# Patient Record
Sex: Female | Born: 1935 | Race: White | Hispanic: No | Marital: Married | Smoking: Never smoker
Health system: Southern US, Community
[De-identification: ages and names within clinical notes are randomized; demographics above are authoritative.]

## PROBLEM LIST (undated history)

## (undated) DIAGNOSIS — I10 Essential (primary) hypertension: Secondary | ICD-10-CM

## (undated) DIAGNOSIS — I4821 Permanent atrial fibrillation: Secondary | ICD-10-CM

## (undated) DIAGNOSIS — E876 Hypokalemia: Secondary | ICD-10-CM

## (undated) DIAGNOSIS — I5030 Unspecified diastolic (congestive) heart failure: Secondary | ICD-10-CM

## (undated) DIAGNOSIS — C50919 Malignant neoplasm of unspecified site of unspecified female breast: Secondary | ICD-10-CM

## (undated) DIAGNOSIS — B029 Zoster without complications: Secondary | ICD-10-CM

## (undated) DIAGNOSIS — R9430 Abnormal result of cardiovascular function study, unspecified: Secondary | ICD-10-CM

## (undated) DIAGNOSIS — R079 Chest pain, unspecified: Secondary | ICD-10-CM

## (undated) DIAGNOSIS — I340 Nonrheumatic mitral (valve) insufficiency: Secondary | ICD-10-CM

## (undated) DIAGNOSIS — E785 Hyperlipidemia, unspecified: Secondary | ICD-10-CM

## (undated) DIAGNOSIS — I4891 Unspecified atrial fibrillation: Secondary | ICD-10-CM

## (undated) DIAGNOSIS — R0602 Shortness of breath: Secondary | ICD-10-CM

## (undated) HISTORY — DX: Essential (primary) hypertension: I10

## (undated) HISTORY — DX: Hyperlipidemia, unspecified: E78.5

## (undated) HISTORY — DX: Chest pain, unspecified: R07.9

## (undated) HISTORY — DX: Malignant neoplasm of unspecified site of unspecified female breast: C50.919

## (undated) HISTORY — DX: Unspecified atrial fibrillation: I48.91

## (undated) HISTORY — DX: Permanent atrial fibrillation: I48.21

## (undated) HISTORY — PX: EYE SURGERY: SHX253

## (undated) HISTORY — PX: ABDOMINAL HYSTERECTOMY: SHX81

## (undated) HISTORY — DX: Zoster without complications: B02.9

## (undated) HISTORY — DX: Unspecified diastolic (congestive) heart failure: I50.30

## (undated) HISTORY — DX: Shortness of breath: R06.02

## (undated) HISTORY — DX: Abnormal result of cardiovascular function study, unspecified: R94.30

## (undated) HISTORY — DX: Hypokalemia: E87.6

## (undated) HISTORY — DX: Nonrheumatic mitral (valve) insufficiency: I34.0

---

## 1983-07-05 DIAGNOSIS — C50919 Malignant neoplasm of unspecified site of unspecified female breast: Secondary | ICD-10-CM

## 1983-07-05 HISTORY — DX: Malignant neoplasm of unspecified site of unspecified female breast: C50.919

## 1983-07-05 HISTORY — PX: AUGMENTATION MAMMAPLASTY: SUR837

## 1983-07-05 HISTORY — PX: MASTECTOMY: SHX3

## 2005-03-02 ENCOUNTER — Ambulatory Visit: Payer: Self-pay | Admitting: Family Medicine

## 2006-03-09 ENCOUNTER — Ambulatory Visit: Payer: Self-pay | Admitting: Family Medicine

## 2006-06-19 ENCOUNTER — Ambulatory Visit: Payer: Self-pay | Admitting: Ophthalmology

## 2006-06-28 ENCOUNTER — Ambulatory Visit: Payer: Self-pay | Admitting: Ophthalmology

## 2007-03-16 ENCOUNTER — Ambulatory Visit: Payer: Self-pay | Admitting: Family Medicine

## 2007-06-12 ENCOUNTER — Ambulatory Visit: Payer: Self-pay | Admitting: Gastroenterology

## 2007-11-27 ENCOUNTER — Ambulatory Visit: Payer: Self-pay | Admitting: Family Medicine

## 2007-12-07 ENCOUNTER — Ambulatory Visit: Payer: Self-pay | Admitting: Family Medicine

## 2008-03-17 ENCOUNTER — Ambulatory Visit: Payer: Self-pay | Admitting: Family Medicine

## 2009-02-06 ENCOUNTER — Ambulatory Visit: Payer: Self-pay | Admitting: Family Medicine

## 2009-03-20 ENCOUNTER — Ambulatory Visit: Payer: Self-pay | Admitting: Family Medicine

## 2010-03-25 ENCOUNTER — Ambulatory Visit: Payer: Self-pay | Admitting: Family Medicine

## 2010-06-11 ENCOUNTER — Ambulatory Visit: Payer: Self-pay | Admitting: Family Medicine

## 2010-06-11 ENCOUNTER — Encounter: Payer: Self-pay | Admitting: Cardiology

## 2010-06-29 ENCOUNTER — Ambulatory Visit: Payer: Self-pay | Admitting: Family Medicine

## 2010-07-08 ENCOUNTER — Encounter: Payer: Self-pay | Admitting: Cardiology

## 2010-07-26 ENCOUNTER — Ambulatory Visit
Admission: RE | Admit: 2010-07-26 | Discharge: 2010-07-26 | Payer: Self-pay | Source: Home / Self Care | Attending: Cardiology | Admitting: Cardiology

## 2010-07-26 ENCOUNTER — Encounter: Payer: Self-pay | Admitting: Cardiology

## 2010-07-26 DIAGNOSIS — B029 Zoster without complications: Secondary | ICD-10-CM | POA: Insufficient documentation

## 2010-07-29 ENCOUNTER — Ambulatory Visit (HOSPITAL_COMMUNITY)
Admission: RE | Admit: 2010-07-29 | Discharge: 2010-07-29 | Payer: Self-pay | Source: Home / Self Care | Attending: Internal Medicine | Admitting: Internal Medicine

## 2010-07-29 ENCOUNTER — Ambulatory Visit: Admission: RE | Admit: 2010-07-29 | Discharge: 2010-07-29 | Payer: Self-pay | Source: Home / Self Care

## 2010-08-05 NOTE — Assessment & Plan Note (Signed)
Summary: NP6/SOB/JML   Visit Type:  Initial Consult Primary Provider:  Dr Julieanne Manson  CC:  shortness of breath and discomfort under her breasts.  History of Present Illness: The patient is seen for cardiology consultation.  I take care of her husband.  She is quite active.  She does have some shortness of breath and carrying something heavy.  This has not changed.  She does have a discomfort underneath her breasts bilaterally.  She did have shingles in the past.  The etiology of the discomfort is not clear.  Preventive Screening-Counseling & Management  Alcohol-Tobacco     Smoking Status: never  Caffeine-Diet-Exercise     Does Patient Exercise: no      Drug Use:  no.    Current Medications (verified): 1)  Alendronate Sodium 35 Mg Tabs (Alendronate Sodium) .... Weekly 2)  Latanoprost 0.005 % Soln (Latanoprost) .... Once Daily 3)  Timolol Maleate 0.5 % Soln (Timolol Maleate) .... Once Daily 4)  Lyrica 50 Mg Caps (Pregabalin) .... Two Times A Day 5)  Amlodipine Besy-Benazepril Hcl 5-40 Mg Caps (Amlodipine Besy-Benazepril Hcl) .... Once Daily 6)  Lipitor 10 Mg Tabs (Atorvastatin Calcium) .... Take One Tablet By Mouth Daily. 7)  Losartan Potassium-Hctz 100-25 Mg Tabs (Losartan Potassium-Hctz) .... Take 1 Tablet By Mouth Once A Day  Allergies (verified): 1)  ! Cortisone 2)  ! * Orimison  Past History:  Past Medical History: Chest pain Hyperlipidemia Hypertension Discomfort under her breasts. Shingles Breast cancer... 1985... implant... no chemotherapy, no radiation Shortness of breath  Past Surgical History: Abdominal Hysterectomy-Total Mastectomy (right)  Family History: Family History of Coronary Artery Disease:  Family History of CVA or Stroke:  Family History of Hypertension:   Social History: Retired  Married  Tobacco Use - No.  Alcohol Use - no Regular Exercise - no Drug Use - no Smoking Status:  never Does Patient Exercise:  no Drug Use:   no  Review of Systems       Patient denies ear, chills, headache, sweats, rash, change in vision, change, cough, nausea vomiting, urinary symptoms.  All other systems are reviewed and are negative.  Vital Signs:  Patient profile:   77 year old female Height:      67 inches Weight:      151 pounds BMI:     23.74 Pulse rate:   81 / minute BP sitting:   150 / 84  (left arm) Cuff size:   regular  Vitals Entered By: Hardin Negus, RMA (July 26, 2010 11:20 AM)  Physical Exam  General:  patient is quite stable today. Head:  head is atraumatic. Eyes:    No xanthelasma. Neck:  no jugular venous distention. Chest Wall:  no chest wall tenderness. Lungs:  lungs are clear.  Respiratory effort is nonlabored. Heart:  cardiac exam reveals S1-S2.  There are no clicks or significant murmurs. Abdomen:  abdomen is soft. Msk:  no musculoskeletal deformities. Extremities:  no peripheral edema. Skin:  no skin rashes. Psych:  patient is oriented to person time and place.  Affect is normal.   Impression & Recommendations:  Problem # 1:  SHORTNESS OF BREATH (ICD-786.05)  The patient has mild shortness of breath.  It occurs with carrying heavy objects.  It has not changed significantly over time.  Two-dimensional echo will be done to assess LV function.  Orders: Echocardiogram (Echo)  Problem # 2:  SHINGLES (ICD-053.9) Is possible that some of the patient's discomfort could be related to chronic symptoms  from her shingles.  Problem # 3:  * DISCOMFORT UNDER THE BREASTS Etiology of this discomfort is not clear.  At this point I'm not convinced that it is cardiac in origin.  I will decide over time if we will do exercise testing. EKG is done today and reviewed by me.  It is also compared with the tracing of July 08, 2010.  There is normal sinus rhythm.  There are nonspecific ST-T wave changes.  Problem # 4:  HYPERTENSION, UNSPECIFIED (ICD-401.9)  Her updated medication list for this  problem includes:    Amlodipine Besy-benazepril Hcl 5-40 Mg Caps (Amlodipine besy-benazepril hcl) ..... Once daily    Losartan Potassium-hctz 100-25 Mg Tabs (Losartan potassium-hctz) .Marland Kitchen... Take 1 tablet by mouth once a day Systolic blood pressure is mildly elevated today.  We will follow this over time.  Problem # 5:  HYPERLIPIDEMIA-MIXED (ICD-272.4)  Her updated medication list for this problem includes:    Lipitor 10 Mg Tabs (Atorvastatin calcium) .Marland Kitchen... Take one tablet by mouth daily. The patient's lipids are treated.  As part of today's evaluation I have reviewed the records sent from the patient's primary physician.  I have also reviewed the labs.  Her triglycerides are 159.  HDL is 59 and TSH is normal.  Liver function studies are normal very LDL is 93.  Vitamin D was on the low side and is being treated.  Hemoglobin is 15.6.  BUN is 8 and creatinine 0.59.  Other Orders: EKG w/ Interpretation (93000)  Patient Instructions: 1)  Your physician recommends that you schedule a follow-up appointment in: 3-4 weeks--please sched after 2D echo--not on same day 2)  Your physician has requested that you have an echocardiogram.  Echocardiography is a painless test that uses sound waves to create images of your heart. It provides your doctor with information about the size and shape of your heart and how well your heart's chambers and valves are working.  This procedure takes approximately one hour. There are no restrictions for this procedure.

## 2010-08-24 ENCOUNTER — Ambulatory Visit: Payer: Self-pay | Admitting: Gastroenterology

## 2010-08-25 ENCOUNTER — Encounter: Payer: Self-pay | Admitting: Cardiology

## 2010-08-27 ENCOUNTER — Ambulatory Visit (INDEPENDENT_AMBULATORY_CARE_PROVIDER_SITE_OTHER): Payer: Medicare Other | Admitting: Cardiology

## 2010-08-27 ENCOUNTER — Encounter: Payer: Self-pay | Admitting: Cardiology

## 2010-08-27 DIAGNOSIS — R0602 Shortness of breath: Secondary | ICD-10-CM

## 2010-08-31 NOTE — Miscellaneous (Signed)
  Clinical Lists Changes  Observations: Added new observation of PAST MED HX: Chest pain Hyperlipidemia Hypertension Discomfort under her breasts. Shingles Breast cancer... 1985... implant... no chemotherapy, no radiation Shortness of breath    07/2010 EF 55-65%   echo..07/29/2010.. Mitral regurg    mild..echo..2012  (08/25/2010 16:41) Added new observation of PRIMARY MD: Dr Julieanne Manson (08/25/2010 16:41)       Past History:  Past Medical History: Chest pain Hyperlipidemia Hypertension Discomfort under her breasts. Shingles Breast cancer... 1985... implant... no chemotherapy, no radiation Shortness of breath    07/2010 EF 55-65%   echo..07/29/2010.. Mitral regurg    mild..echo..2012

## 2010-08-31 NOTE — Assessment & Plan Note (Signed)
Summary: 4 WEEK F/U ECHO DONE 07-29-10/SL   Visit Type:  Follow-up Primary Provider:  Dr Julieanne Manson  CC:  shortness of breath.  History of Present Illness: The patient is seen for followup of shortness of breath.  I saw her last July 26, 2010.  Decision was made at that time to proceed with an echocardiogram.  This was done showing normal LV function.  There is suggestion of some diastolic dysfunction.  Patient returns today feeling well.  Current Medications (verified): 1)  Alendronate Sodium 35 Mg Tabs (Alendronate Sodium) .... Weekly 2)  Latanoprost 0.005 % Soln (Latanoprost) .... Once Daily 3)  Timolol Maleate 0.5 % Soln (Timolol Maleate) .... Once Daily 4)  Lyrica 50 Mg Caps (Pregabalin) .... Two Times A Day 5)  Amlodipine Besy-Benazepril Hcl 5-40 Mg Caps (Amlodipine Besy-Benazepril Hcl) .... Once Daily 6)  Lipitor 10 Mg Tabs (Atorvastatin Calcium) .... Take One Tablet By Mouth Daily. 7)  Losartan Potassium-Hctz 100-25 Mg Tabs (Losartan Potassium-Hctz) .... Take 1 Tablet By Mouth Once A Day  Allergies (verified): 1)  ! Cortisone 2)  ! * Orimison  Past History:  Past Medical History: Chest pain Hyperlipidemia Hypertension Discomfort under her breasts. Shingles Breast cancer... 1985... implant... no chemotherapy, no radiation Shortness of breath    07/2010 EF 55-65%   echo..07/29/2010.. ( ? some diastolic dysfunction ?) Mitral regurg    mild..echo..2012  Review of Systems       Patient denies fever, chills, headache, sweats, rash, change in vision, change in hearing, chest pain, cough, nausea vomiting, urinary symptoms.  All of the systems are reviewed and are negative.  Vital Signs:  Patient profile:   77 year old female Height:      67 inches Weight:      153 pounds BMI:     24.05 Pulse rate:   70 / minute BP sitting:   110 / 74  (left arm) Cuff size:   regular  Vitals Entered By: Hardin Negus, RMA (August 27, 2010 12:00 PM)  Physical  Exam  General:  patient is stable today. Eyes:  no xanthelasma. Neck:  no jugular venous distention. Lungs:  lungs are clear.  Respiratory effort is nonlabored. Heart:  cardiac exam reveals S1-S2.  No clicks or significant murmurs. Abdomen:  abdomen soft. Extremities:  no peripheral edema. Psych:  patient is oriented to person time and place.  Affect is normal.   Impression & Recommendations:  Problem # 1:  SHORTNESS OF BREATH (ICD-786.05) Patient is feeling better today.  She is not fluid overload.  She has normal systolic LV function by echo.  It is possible that she could have some diastolic dysfunction but I'm not convinced that this is causing any problems.  I feel she does not need a diuretic at this time.  Problem # 2:  * DISCOMFORT UNDER THE BREASTS The area of discomfort under her breast does not appear to be ischemic in origin.  No further cardiac workup  Problem # 3:  HYPERTENSION, UNSPECIFIED (ICD-401.9) Blood pressure control.  No change in therapy.  Patient Instructions: 1)  Your physician recommends that you schedule a follow-up appointment in: 2 years with Dr. Myrtis Ser 2)  Your physician recommends that you continue on your current medications as directed. Please refer to the Current Medication list given to you today.

## 2010-09-09 NOTE — Letter (Signed)
Summary: [facility]   Imported By: Marylou Mccoy 09/01/2010 16:30:00  _____________________________________________________________________  External Attachment:    Type:   Image     Comment:   External Document

## 2011-03-31 ENCOUNTER — Ambulatory Visit: Payer: Self-pay | Admitting: Family Medicine

## 2012-04-02 ENCOUNTER — Ambulatory Visit: Payer: Self-pay | Admitting: Family Medicine

## 2012-08-17 ENCOUNTER — Encounter: Payer: Self-pay | Admitting: Cardiology

## 2012-08-17 DIAGNOSIS — R079 Chest pain, unspecified: Secondary | ICD-10-CM | POA: Insufficient documentation

## 2012-08-17 DIAGNOSIS — I340 Nonrheumatic mitral (valve) insufficiency: Secondary | ICD-10-CM | POA: Insufficient documentation

## 2012-08-17 DIAGNOSIS — I10 Essential (primary) hypertension: Secondary | ICD-10-CM | POA: Insufficient documentation

## 2012-08-17 DIAGNOSIS — C50919 Malignant neoplasm of unspecified site of unspecified female breast: Secondary | ICD-10-CM | POA: Insufficient documentation

## 2012-08-17 DIAGNOSIS — R0602 Shortness of breath: Secondary | ICD-10-CM | POA: Insufficient documentation

## 2012-08-17 DIAGNOSIS — R9430 Abnormal result of cardiovascular function study, unspecified: Secondary | ICD-10-CM | POA: Insufficient documentation

## 2012-08-17 DIAGNOSIS — E785 Hyperlipidemia, unspecified: Secondary | ICD-10-CM | POA: Insufficient documentation

## 2012-08-20 ENCOUNTER — Encounter: Payer: Self-pay | Admitting: Cardiology

## 2012-08-20 ENCOUNTER — Ambulatory Visit (INDEPENDENT_AMBULATORY_CARE_PROVIDER_SITE_OTHER): Payer: Self-pay | Admitting: Cardiology

## 2012-08-20 VITALS — BP 122/78 | HR 84 | Ht 67.0 in | Wt 153.0 lb

## 2012-08-20 DIAGNOSIS — I340 Nonrheumatic mitral (valve) insufficiency: Secondary | ICD-10-CM

## 2012-08-20 DIAGNOSIS — I10 Essential (primary) hypertension: Secondary | ICD-10-CM

## 2012-08-20 DIAGNOSIS — R0602 Shortness of breath: Secondary | ICD-10-CM

## 2012-08-20 DIAGNOSIS — I4891 Unspecified atrial fibrillation: Secondary | ICD-10-CM | POA: Insufficient documentation

## 2012-08-20 DIAGNOSIS — I059 Rheumatic mitral valve disease, unspecified: Secondary | ICD-10-CM

## 2012-08-20 LAB — CBC WITH DIFFERENTIAL/PLATELET
Basophils Absolute: 0 10*3/uL (ref 0.0–0.1)
Eosinophils Absolute: 0.3 10*3/uL (ref 0.0–0.7)
HCT: 45 % (ref 36.0–46.0)
Hemoglobin: 15.2 g/dL — ABNORMAL HIGH (ref 12.0–15.0)
Lymphs Abs: 1.2 10*3/uL (ref 0.7–4.0)
MCHC: 33.7 g/dL (ref 30.0–36.0)
MCV: 93.2 fl (ref 78.0–100.0)
Monocytes Absolute: 0.7 10*3/uL (ref 0.1–1.0)
Monocytes Relative: 9.4 % (ref 3.0–12.0)
Neutro Abs: 4.7 10*3/uL (ref 1.4–7.7)
Platelets: 283 10*3/uL (ref 150.0–400.0)
RDW: 12.7 % (ref 11.5–14.6)

## 2012-08-20 LAB — HEPATIC FUNCTION PANEL
ALT: 18 U/L (ref 0–35)
Bilirubin, Direct: 0.1 mg/dL (ref 0.0–0.3)
Total Bilirubin: 1.3 mg/dL — ABNORMAL HIGH (ref 0.3–1.2)
Total Protein: 6.6 g/dL (ref 6.0–8.3)

## 2012-08-20 LAB — BASIC METABOLIC PANEL
BUN: 9 mg/dL (ref 6–23)
Calcium: 9.7 mg/dL (ref 8.4–10.5)
Chloride: 87 mEq/L — ABNORMAL LOW (ref 96–112)
Creatinine, Ser: 0.5 mg/dL (ref 0.4–1.2)

## 2012-08-20 LAB — TSH: TSH: 1.7 u[IU]/mL (ref 0.35–5.50)

## 2012-08-20 MED ORDER — POTASSIUM CHLORIDE ER 10 MEQ PO TBCR: 20.0000 meq | EXTENDED_RELEASE_TABLET | Freq: Every day | ORAL | Status: DC

## 2012-08-20 NOTE — Assessment & Plan Note (Signed)
She is not having any significant shortness of breath.  No change in therapy 

## 2012-08-20 NOTE — Progress Notes (Signed)
HPI   The patient is seen today for cardiology followup. She is actually feeling well. I saw her last February, 2012. She had some shortness of breath in the past. 2-D echo had shown normal left ventricular function area there was question of some diastolic dysfunction and some mild mitral regurgitation. Further cardiac workup was not needed.  During the evaluation today an EKG has shown atrial fibrillation. There is no prior history of this. She is not symptomatic. She does not feel the atrial fibrillation. I had a long discussion with her about this. I explained that anticoagulation would be appropriate. She is willing to proceed. We had an extensive discussion with her husband included about which class of medicine to use. She is aware of the pros and cons of Coumadin or then more novel agents. I made it clear to her that I was comfortable with either approach. She tells me that she is not yet ready to try one of the novel agents. Therefore Coumadin will be started after we check some baseline labs to be sure that she is stable. We will arrange for followup of her Coumadin as close to her home as possible.  Allergies  Allergen Reactions  . Cortisone     Current Outpatient Prescriptions  Medication Sig Dispense Refill  . amLODipine-benazepril (LOTREL) 5-40 MG per capsule Take 1 capsule by mouth daily.       Marland Kitchen atorvastatin (LIPITOR) 10 MG tablet Take 10 mg by mouth daily.       . cholecalciferol (VITAMIN D) 1000 UNITS tablet Take 1,000 Units by mouth daily.      Marland Kitchen latanoprost (XALATAN) 0.005 % ophthalmic solution Place into both eyes daily.       Marland Kitchen losartan-hydrochlorothiazide (HYZAAR) 100-25 MG per tablet Take 1 tablet by mouth daily.       Marland Kitchen LYRICA 50 MG capsule Take 50 mg by mouth 2 (two) times daily.       . timolol (TIMOPTIC) 0.5 % ophthalmic solution Place into both eyes daily.        No current facility-administered medications for this visit.    History   Social History  .  Marital Status: Married    Spouse Name: N/A    Number of Children: N/A  . Years of Education: N/A   Occupational History  . Not on file.   Social History Main Topics  . Smoking status: Never Smoker   . Smokeless tobacco: Never Used  . Alcohol Use: Yes     Comment: red wine occasionally  . Drug Use: No  . Sexually Active: Not on file   Other Topics Concern  . Not on file   Social History Narrative  . No narrative on file    No family history on file.  Past Medical History  Diagnosis Date  . Shingles   . Dyslipidemia   . Hypertension   . Breast cancer     1985, implant, no chemotherapy or radiation  . Shortness of breath     January, 2012, normal systolic function, question diastolic dysfunction  . Chest pain   . Ejection fraction     EF 55-65%, January, 2012, question diastolic dysfunction  . Mitral regurgitation     Mild, echo, 2012    Past Surgical History  Procedure Laterality Date  . Abdominal hysterectomy      age 8  . Mastectomy      reconstruction inplant as well    Patient Active Problem List  Diagnosis  .  SHINGLES  . Dyslipidemia  . Hypertension  . Breast cancer  . Shortness of breath  . Chest pain  . Ejection fraction  . Mitral regurgitation    ROS  Patient denies fever, chills, headache, sweats, rash, change in vision, change in hearing, chest pain, cough, nausea vomiting, urinary symptoms. All other systems are reviewed and are negative.  PHYSICAL EXAM   Patient is oriented to person time and place. Affect is normal. Her husband is in the room with her. There is no jugulovenous distention. Lungs are clear. Respiratory effort is nonlabored. Cardiac exam reveals S1 and S2. The rhythm is irregularly irregular. Abdomen is soft. There is no peripheral edema. There no musculoskeletal deformities. There are no skin rashes.  Filed Vitals:   08/20/12 0935  BP: 122/78  Pulse: 84  Height: 5\' 7"  (1.702 m)  Weight: 153 lb (69.4 kg)   EKG is  done today and reviewed by me. There is atrial fibrillation that is new. The rate is controlled.  ASSESSMENT & PLAN

## 2012-08-20 NOTE — Addendum Note (Signed)
Addended by: Worthy Rancher D on: 08/20/2012 05:29 PM   Modules accepted: Orders

## 2012-08-20 NOTE — Assessment & Plan Note (Signed)
Blood pressures control today. No change in therapy. 

## 2012-08-20 NOTE — Assessment & Plan Note (Signed)
Mitral regurgitation is mild by echo in the past. No change in therapy.

## 2012-08-20 NOTE — Patient Instructions (Addendum)
Your physician recommends that you return for lab work in: today  (cbc, tsh, cmp)  Your physician recommends that you schedule a follow-up appointment in: 6-8 weeks  You have an appt at 2:40 pm on Wed, 08/22/12 at the [place] office on in the coumadin clinic  ([address] [phone])

## 2012-08-20 NOTE — Assessment & Plan Note (Addendum)
Atrial fibrillation is a new diagnosis today. She is not having any significant symptoms. We had a lengthy discussion about the approach to her care. Her choice of anticoagulant at this time his Coumadin. Labs will be drawn. We will then make plans for her Coumadin followup. I will see her back fairly soon to followup her rhythm again since this is a new diagnosis.  As part of today's evaluation I have spent greater than 25 minutes with her overall care. More than half of this time was with direct counseling with the patient and her husband about her atrial fibrillation.

## 2012-08-22 ENCOUNTER — Ambulatory Visit (INDEPENDENT_AMBULATORY_CARE_PROVIDER_SITE_OTHER): Payer: Medicare Other

## 2012-08-22 DIAGNOSIS — Z7901 Long term (current) use of anticoagulants: Secondary | ICD-10-CM

## 2012-08-22 DIAGNOSIS — I4891 Unspecified atrial fibrillation: Secondary | ICD-10-CM

## 2012-08-22 MED ORDER — WARFARIN SODIUM 5 MG PO TABS: ORAL_TABLET | ORAL | Status: DC

## 2012-08-22 MED ORDER — COUMADIN 5 MG PO TABS: ORAL_TABLET | ORAL | Status: DC

## 2012-08-22 NOTE — Patient Instructions (Signed)

## 2012-08-27 ENCOUNTER — Ambulatory Visit (INDEPENDENT_AMBULATORY_CARE_PROVIDER_SITE_OTHER): Payer: Medicare Other

## 2012-08-27 DIAGNOSIS — I4891 Unspecified atrial fibrillation: Secondary | ICD-10-CM

## 2012-08-27 DIAGNOSIS — Z7901 Long term (current) use of anticoagulants: Secondary | ICD-10-CM

## 2012-08-27 LAB — POCT INR: INR: 2.5

## 2012-09-05 ENCOUNTER — Ambulatory Visit (INDEPENDENT_AMBULATORY_CARE_PROVIDER_SITE_OTHER): Payer: Medicare Other

## 2012-09-05 DIAGNOSIS — Z7901 Long term (current) use of anticoagulants: Secondary | ICD-10-CM

## 2012-09-05 DIAGNOSIS — I4891 Unspecified atrial fibrillation: Secondary | ICD-10-CM

## 2012-09-05 LAB — POCT INR: INR: 5.7

## 2012-09-12 ENCOUNTER — Ambulatory Visit (INDEPENDENT_AMBULATORY_CARE_PROVIDER_SITE_OTHER): Payer: Medicare Other

## 2012-09-12 DIAGNOSIS — Z7901 Long term (current) use of anticoagulants: Secondary | ICD-10-CM

## 2012-09-12 DIAGNOSIS — I4891 Unspecified atrial fibrillation: Secondary | ICD-10-CM

## 2012-09-12 LAB — POCT INR: INR: 4.3

## 2012-09-19 ENCOUNTER — Ambulatory Visit (INDEPENDENT_AMBULATORY_CARE_PROVIDER_SITE_OTHER): Payer: Medicare Other

## 2012-09-19 DIAGNOSIS — Z7901 Long term (current) use of anticoagulants: Secondary | ICD-10-CM

## 2012-09-19 DIAGNOSIS — I4891 Unspecified atrial fibrillation: Secondary | ICD-10-CM

## 2012-09-19 LAB — POCT INR: INR: 2.8

## 2012-09-26 ENCOUNTER — Ambulatory Visit (INDEPENDENT_AMBULATORY_CARE_PROVIDER_SITE_OTHER): Payer: Medicare Other

## 2012-09-26 DIAGNOSIS — I4891 Unspecified atrial fibrillation: Secondary | ICD-10-CM

## 2012-09-26 DIAGNOSIS — Z7901 Long term (current) use of anticoagulants: Secondary | ICD-10-CM

## 2012-09-26 LAB — POCT INR: INR: 2.9

## 2012-10-04 ENCOUNTER — Ambulatory Visit (INDEPENDENT_AMBULATORY_CARE_PROVIDER_SITE_OTHER): Payer: Medicare Other | Admitting: Cardiology

## 2012-10-04 ENCOUNTER — Encounter: Payer: Self-pay | Admitting: Cardiology

## 2012-10-04 VITALS — BP 134/82 | HR 93 | Ht 67.0 in | Wt 151.4 lb

## 2012-10-04 DIAGNOSIS — I4891 Unspecified atrial fibrillation: Secondary | ICD-10-CM

## 2012-10-04 DIAGNOSIS — Z7901 Long term (current) use of anticoagulants: Secondary | ICD-10-CM

## 2012-10-04 DIAGNOSIS — E876 Hypokalemia: Secondary | ICD-10-CM | POA: Insufficient documentation

## 2012-10-04 DIAGNOSIS — I10 Essential (primary) hypertension: Secondary | ICD-10-CM

## 2012-10-04 LAB — BASIC METABOLIC PANEL
BUN: 6 mg/dL (ref 6–23)
CO2: 33 mEq/L — ABNORMAL HIGH (ref 19–32)
Calcium: 8.8 mg/dL (ref 8.4–10.5)
Creatinine, Ser: 0.5 mg/dL (ref 0.4–1.2)

## 2012-10-04 NOTE — Assessment & Plan Note (Signed)
Blood pressures control. No change in therapy. 

## 2012-10-04 NOTE — Assessment & Plan Note (Signed)
Potassium was 2.9 when we began therapy in February, 2014. Currently she is on 20 mEq daily. Lab will be checked today. We will then let her know if she should continue the same dose of potassium. She would like to take less if possible.

## 2012-10-04 NOTE — Patient Instructions (Addendum)
Your physician recommends that you return for lab work in: today (bmet)  Your physician recommends that you continue on your current medications as directed. Please refer to the Current Medication list given to you today.  Your physician wants you to follow-up in: 6 months.   You will receive a reminder letter in the mail two months in advance. If you don't receive a letter, please call our office to schedule the follow-up appointment.

## 2012-10-04 NOTE — Assessment & Plan Note (Signed)
She's doing well with her Coumadin therapy. No change in therapy.

## 2012-10-04 NOTE — Progress Notes (Signed)
HPI  Patient is seen today to followup atrial fibrillation and Coumadin therapy and hypokalemia. She's doing well. When I saw her last August 20, 2012 we noted atrial fibrillation. We had a careful discussion about choice of anticoagulant and she preferred to use Coumadin. This has been started and she is stable and feels well with it. Also noted at that time that her potassium was 2.9. She was treated for 3 days with 40 mEq daily followed by 20 mEq daily until now. She thinks that she doesn't feel great because of potassium. We will reassess the value today and decide about her dosing.  Allergies  Allergen Reactions  . Cortisone     Current Outpatient Prescriptions  Medication Sig Dispense Refill  . amLODipine-benazepril (LOTREL) 5-40 MG per capsule Take 1 capsule by mouth daily.       Marland Kitchen atorvastatin (LIPITOR) 10 MG tablet Take 10 mg by mouth daily.       . cholecalciferol (VITAMIN D) 1000 UNITS tablet Take 1,000 Units by mouth daily.      Marland Kitchen COUMADIN 5 MG tablet Take as directed by anticoagulation clinic  30 tablet  1  . latanoprost (XALATAN) 0.005 % ophthalmic solution Place into both eyes daily.       Marland Kitchen losartan-hydrochlorothiazide (HYZAAR) 100-25 MG per tablet Take 1 tablet by mouth daily.       Marland Kitchen LYRICA 50 MG capsule Take 50 mg by mouth 2 (two) times daily.       . potassium chloride (K-DUR) 10 MEQ tablet Take 2 tablets (20 mEq total) by mouth daily. Take 4 tablets (40 mEq) daily for the first two days and then take 2 tabs daily thereafter.  64 tablet  6  . timolol (TIMOPTIC) 0.5 % ophthalmic solution Place into both eyes daily.        No current facility-administered medications for this visit.    History   Social History  . Marital Status: Married    Spouse Name: N/A    Number of Children: N/A  . Years of Education: N/A   Occupational History  . Not on file.   Social History Main Topics  . Smoking status: Never Smoker   . Smokeless tobacco: Never Used  . Alcohol Use:  Yes     Comment: red wine occasionally  . Drug Use: No  . Sexually Active: Not on file   Other Topics Concern  . Not on file   Social History Narrative  . No narrative on file    No family history on file.  Past Medical History  Diagnosis Date  . Shingles   . Dyslipidemia   . Hypertension   . Breast cancer     1985, implant, no chemotherapy or radiation  . Shortness of breath     January, 2012, normal systolic function, question diastolic dysfunction  . Chest pain   . Ejection fraction     EF 55-65%, January, 2012, question diastolic dysfunction  . Mitral regurgitation     Mild, echo, 2012  . Atrial fibrillation     New diagnosis, asymptomatic, February, 2014  . Hypokalemia     February, 2014, potassium started    Past Surgical History  Procedure Laterality Date  . Abdominal hysterectomy      age 80  . Mastectomy      reconstruction inplant as well    Patient Active Problem List  Diagnosis  . SHINGLES  . Dyslipidemia  . Hypertension  . Breast cancer  .  Shortness of breath  . Chest pain  . Ejection fraction  . Mitral regurgitation  . Atrial fibrillation  . Long term (current) use of anticoagulants  . Hypokalemia    ROS   Patient denies fever, chills, headache, sweats, rash, change in vision, change in hearing, chest pain, cough, nausea vomiting, urinary symptoms. All other systems are reviewed and are negative.  PHYSICAL EXAM  Patient is oriented to person time and place. Affect is normal. She's here with her husband. There is no jugulovenous distention. Lungs are clear. Respiratory effort is nonlabored. Cardiac exam reveals S1 and S2. There no clicks or significant murmurs. The rhythm is irregularly irregular. The abdomen is soft. There is no peripheral edema.  Filed Vitals:   10/04/12 1003  BP: 134/82  Pulse: 93  Height: 5\' 7"  (1.702 m)  Weight: 151 lb 6.4 oz (68.675 kg)  SpO2: 97%   EKG is done today and reviewed by me. There is atrial  fibrillation. The rate is controlled. There are nonspecific ST-T wave changes.  ASSESSMENT & PLAN

## 2012-10-04 NOTE — Assessment & Plan Note (Signed)
Atrial fibrillation is under good rate control. No change in therapy.

## 2012-10-05 ENCOUNTER — Telehealth: Payer: Self-pay | Admitting: *Deleted

## 2012-10-05 DIAGNOSIS — E876 Hypokalemia: Secondary | ICD-10-CM

## 2012-10-05 NOTE — Telephone Encounter (Signed)
DOD Dr. Eden Emms reviewed bmp from 4/3. Recommended pt take daily pt agrees .  Will return next Friday for bmp Mylo Red RN

## 2012-10-10 ENCOUNTER — Ambulatory Visit (INDEPENDENT_AMBULATORY_CARE_PROVIDER_SITE_OTHER): Payer: Medicare Other

## 2012-10-10 DIAGNOSIS — Z7901 Long term (current) use of anticoagulants: Secondary | ICD-10-CM

## 2012-10-10 DIAGNOSIS — I4891 Unspecified atrial fibrillation: Secondary | ICD-10-CM

## 2012-10-10 LAB — POCT INR: INR: 3.1

## 2012-10-12 ENCOUNTER — Other Ambulatory Visit: Payer: Self-pay

## 2012-10-12 ENCOUNTER — Telehealth: Payer: Self-pay

## 2012-10-12 ENCOUNTER — Other Ambulatory Visit (INDEPENDENT_AMBULATORY_CARE_PROVIDER_SITE_OTHER): Payer: Medicare Other

## 2012-10-12 DIAGNOSIS — I10 Essential (primary) hypertension: Secondary | ICD-10-CM

## 2012-10-12 DIAGNOSIS — E876 Hypokalemia: Secondary | ICD-10-CM

## 2012-10-12 LAB — BASIC METABOLIC PANEL
CO2: 29 mEq/L (ref 19–32)
Calcium: 8.8 mg/dL (ref 8.4–10.5)
Creatinine, Ser: 0.5 mg/dL (ref 0.4–1.2)
GFR: 126.22 mL/min (ref >60.00)
Glucose, Bld: 84 mg/dL (ref 70–99)
Sodium: 125 mEq/L — ABNORMAL LOW (ref 135–145)

## 2012-10-12 MED ORDER — POTASSIUM CHLORIDE ER 20 MEQ PO TBCR: 40.0000 meq | EXTENDED_RELEASE_TABLET | Freq: Every day | ORAL | Status: DC

## 2012-10-12 MED ORDER — LOSARTAN POTASSIUM 100 MG PO TABS: 100.0000 mg | ORAL_TABLET | Freq: Every day | ORAL | Status: DC

## 2012-10-12 MED ORDER — HYDROCHLOROTHIAZIDE 12.5 MG PO CAPS: 12.5000 mg | ORAL_CAPSULE | Freq: Every day | ORAL | Status: DC

## 2012-10-16 ENCOUNTER — Other Ambulatory Visit (INDEPENDENT_AMBULATORY_CARE_PROVIDER_SITE_OTHER): Payer: Medicare Other

## 2012-10-16 DIAGNOSIS — I10 Essential (primary) hypertension: Secondary | ICD-10-CM

## 2012-10-16 LAB — BASIC METABOLIC PANEL
Calcium: 8.9 mg/dL (ref 8.4–10.5)
Chloride: 90 mEq/L — ABNORMAL LOW (ref 96–112)
Creatinine, Ser: 0.6 mg/dL (ref 0.4–1.2)

## 2012-10-17 ENCOUNTER — Other Ambulatory Visit: Payer: Self-pay | Admitting: *Deleted

## 2012-10-17 ENCOUNTER — Telehealth: Payer: Self-pay | Admitting: *Deleted

## 2012-10-17 DIAGNOSIS — IMO0001 Reserved for inherently not codable concepts without codable children: Secondary | ICD-10-CM

## 2012-10-17 MED ORDER — COUMADIN 5 MG PO TABS: ORAL_TABLET | ORAL | Status: DC

## 2012-10-17 NOTE — Telephone Encounter (Signed)
Follow-up: ° ° ° °Patient called in returning your call.  Please call back. °

## 2012-10-17 NOTE — Telephone Encounter (Signed)
Advised patient of appointment for labs in [place]

## 2012-10-17 NOTE — Telephone Encounter (Signed)
Follow-up:    Patient called in wanting to know why she has not received a call back yet.  Please call back.

## 2012-10-17 NOTE — Telephone Encounter (Signed)
LABS ARE SCHEDULED 4/30 SAME DAY AS COUMADIN CHECK NOT 4/21, Left message to call back

## 2012-10-17 NOTE — Telephone Encounter (Signed)
Notes Recorded by Burnell Blanks on 10/16/2012 at 5:36 PM Labs reviewed by Dr Elease Hashimoto and will have patient decrease free water and recheck in 2 weeks. Advised patient  PATIENT WANTED FOLLOW UP LABS IN [place], [place] FOR 4/21 AT 2:00. Left message to call back

## 2012-10-31 ENCOUNTER — Ambulatory Visit (INDEPENDENT_AMBULATORY_CARE_PROVIDER_SITE_OTHER): Payer: Medicare Other

## 2012-10-31 DIAGNOSIS — Z7901 Long term (current) use of anticoagulants: Secondary | ICD-10-CM

## 2012-10-31 DIAGNOSIS — Z789 Other specified health status: Secondary | ICD-10-CM

## 2012-10-31 DIAGNOSIS — I4891 Unspecified atrial fibrillation: Secondary | ICD-10-CM

## 2012-10-31 DIAGNOSIS — IMO0001 Reserved for inherently not codable concepts without codable children: Secondary | ICD-10-CM

## 2012-10-31 LAB — POCT INR: INR: 2.7

## 2012-10-31 MED ORDER — COUMADIN 2.5 MG PO TABS: 2.5000 mg | ORAL_TABLET | Freq: Every day | ORAL | Status: DC

## 2012-11-01 LAB — BASIC METABOLIC PANEL
BUN: 6 mg/dL — ABNORMAL LOW (ref 8–27)
CO2: 29 mmol/L — ABNORMAL HIGH (ref 19–28)
Calcium: 10 mg/dL (ref 8.6–10.2)
Chloride: 92 mmol/L — ABNORMAL LOW (ref 97–108)
GFR calc Af Amer: 99 mL/min/{1.73_m2} (ref >59)
Glucose: 114 mg/dL — ABNORMAL HIGH (ref 65–99)

## 2012-11-02 ENCOUNTER — Other Ambulatory Visit: Payer: Self-pay

## 2012-11-02 MED ORDER — POTASSIUM CHLORIDE ER 20 MEQ PO TBCR: 20.0000 meq | EXTENDED_RELEASE_TABLET | Freq: Every day | ORAL | Status: DC

## 2012-11-28 ENCOUNTER — Ambulatory Visit (INDEPENDENT_AMBULATORY_CARE_PROVIDER_SITE_OTHER): Payer: Medicare Other

## 2012-11-28 DIAGNOSIS — Z7901 Long term (current) use of anticoagulants: Secondary | ICD-10-CM

## 2012-11-28 DIAGNOSIS — I4891 Unspecified atrial fibrillation: Secondary | ICD-10-CM

## 2012-11-28 LAB — POCT INR: INR: 2.8

## 2012-12-26 ENCOUNTER — Ambulatory Visit (INDEPENDENT_AMBULATORY_CARE_PROVIDER_SITE_OTHER): Payer: Medicare Other

## 2012-12-26 DIAGNOSIS — I4891 Unspecified atrial fibrillation: Secondary | ICD-10-CM

## 2012-12-26 DIAGNOSIS — Z7901 Long term (current) use of anticoagulants: Secondary | ICD-10-CM

## 2013-02-06 ENCOUNTER — Ambulatory Visit (INDEPENDENT_AMBULATORY_CARE_PROVIDER_SITE_OTHER): Payer: Medicare Other

## 2013-02-06 DIAGNOSIS — I4891 Unspecified atrial fibrillation: Secondary | ICD-10-CM

## 2013-02-06 DIAGNOSIS — Z7901 Long term (current) use of anticoagulants: Secondary | ICD-10-CM

## 2013-03-07 ENCOUNTER — Encounter: Payer: Self-pay | Admitting: Cardiology

## 2013-03-07 ENCOUNTER — Ambulatory Visit (INDEPENDENT_AMBULATORY_CARE_PROVIDER_SITE_OTHER): Payer: Medicare Other | Admitting: Cardiology

## 2013-03-07 VITALS — BP 132/78 | HR 88 | Ht 67.0 in | Wt 153.0 lb

## 2013-03-07 DIAGNOSIS — I10 Essential (primary) hypertension: Secondary | ICD-10-CM

## 2013-03-07 DIAGNOSIS — I4891 Unspecified atrial fibrillation: Secondary | ICD-10-CM

## 2013-03-07 DIAGNOSIS — Z01810 Encounter for preprocedural cardiovascular examination: Secondary | ICD-10-CM

## 2013-03-07 DIAGNOSIS — R0602 Shortness of breath: Secondary | ICD-10-CM

## 2013-03-07 DIAGNOSIS — Z7901 Long term (current) use of anticoagulants: Secondary | ICD-10-CM

## 2013-03-07 DIAGNOSIS — E876 Hypokalemia: Secondary | ICD-10-CM

## 2013-03-07 LAB — BASIC METABOLIC PANEL
BUN: 8 mg/dL (ref 6–23)
Calcium: 9.4 mg/dL (ref 8.4–10.5)
Creatinine, Ser: 0.6 mg/dL (ref 0.4–1.2)
GFR: 112.96 mL/min (ref >60.00)
Glucose, Bld: 106 mg/dL — ABNORMAL HIGH (ref 70–99)

## 2013-03-07 NOTE — Assessment & Plan Note (Signed)
She continues on Coumadin. We have discussed the possibility of other medications previously. She wants to remain on Coumadin and I am fine with this.

## 2013-03-07 NOTE — Assessment & Plan Note (Signed)
The patient is anticoagulated. Her atrial fibrillation is controlled. No change in therapy.

## 2013-03-07 NOTE — Assessment & Plan Note (Signed)
Blood pressures control. No change in therapy. 

## 2013-03-07 NOTE — Assessment & Plan Note (Signed)
The patient will be having cataract surgery soon. She will be checking with her ophthalmologist to see if her Coumadin needs to be held. If it does need to be held, it will be fine to hold it for 7 days before the procedure and started back afterwards.

## 2013-03-07 NOTE — Assessment & Plan Note (Signed)
We need to recheck her potassium today and this is being arranged.

## 2013-03-07 NOTE — Progress Notes (Signed)
HPI  The patient is seen today to followup atrial fibrillation. I saw her last in April, 2014. She was stable at that time. Her potassium had been quite low and she was dosed with potassium. This stabilized and followup labs revealed a potassium of 4.9. Decision was made for her to continue 20 mEq of potassium daily. She needs followup chemistry lab today.  Allergies  Allergen Reactions  . Cortisone     Current Outpatient Prescriptions  Medication Sig Dispense Refill  . amLODipine-benazepril (LOTREL) 5-40 MG per capsule Take 1 capsule by mouth daily.       Marland Kitchen atorvastatin (LIPITOR) 10 MG tablet Take 10 mg by mouth daily.       . cholecalciferol (VITAMIN D) 1000 UNITS tablet Take 1,000 Units by mouth daily.      Marland Kitchen COUMADIN 2.5 MG tablet Take 1 tablet (2.5 mg total) by mouth daily.  35 tablet  3  . hydrochlorothiazide (MICROZIDE) 12.5 MG capsule Take 1 capsule (12.5 mg total) by mouth daily.  90 capsule  1  . latanoprost (XALATAN) 0.005 % ophthalmic solution Place into both eyes daily.       Marland Kitchen losartan (COZAAR) 100 MG tablet Take 1 tablet (100 mg total) by mouth daily.  90 tablet  1  . LYRICA 50 MG capsule Take 50 mg by mouth 2 (two) times daily.       . Potassium Chloride ER 20 MEQ TBCR Take 20 mEq by mouth daily.  90 tablet  1  . timolol (TIMOPTIC) 0.5 % ophthalmic solution Place into both eyes daily.        No current facility-administered medications for this visit.    History   Social History  . Marital Status: Married    Spouse Name: N/A    Number of Children: N/A  . Years of Education: N/A   Occupational History  . Not on file.   Social History Main Topics  . Smoking status: Never Smoker   . Smokeless tobacco: Never Used  . Alcohol Use: Yes     Comment: red wine occasionally  . Drug Use: No  . Sexual Activity: Not on file   Other Topics Concern  . Not on file   Social History Narrative  . No narrative on file    No family history on file.  Past Medical  History  Diagnosis Date  . Shingles   . Dyslipidemia   . Hypertension   . Breast cancer     1985, implant, no chemotherapy or radiation  . Shortness of breath     January, 2012, normal systolic function, question diastolic dysfunction  . Chest pain   . Ejection fraction     EF 55-65%, January, 2012, question diastolic dysfunction  . Mitral regurgitation     Mild, echo, 2012  . Atrial fibrillation     New diagnosis, asymptomatic, February, 2014  . Hypokalemia     February, 2014, potassium started    Past Surgical History  Procedure Laterality Date  . Abdominal hysterectomy      age 67  . Mastectomy      reconstruction inplant as well    Patient Active Problem List   Diagnosis Date Noted  . Hypokalemia   . Long term (current) use of anticoagulants 08/22/2012  . Atrial fibrillation   . Dyslipidemia   . Hypertension   . Breast cancer   . Shortness of breath   . Chest pain   . Ejection fraction   .  Mitral regurgitation   . SHINGLES 07/26/2010    ROS   Patient denies fever, chills, headache, sweats, rash, change in vision, change in hearing, chest pain, cough, nausea vomiting, urinary symptoms. All other systems are reviewed and are negative.  PHYSICAL EXAM   Patient is quite stable. She's here with her husband today. There is no jugulovenous distention. Lungs are clear. Respiratory effort is nonlabored. Cardiac exam reveals S1 and S2. The abdomen is soft. The patient has large legs. There is no definite edema. There no skin rashes. There no musculoskeletal deformities.  Filed Vitals:   03/07/13 0909  BP: 132/78  Pulse: 88  Height: 5\' 7"  (1.702 m)  Weight: 153 lb (69.4 kg)  SpO2: 99%     ASSESSMENT & PLAN

## 2013-03-07 NOTE — Assessment & Plan Note (Signed)
She is not having any significant shortness of breath.

## 2013-03-07 NOTE — Patient Instructions (Addendum)
Your physician recommends that you continue on your current medications as directed. Please refer to the Current Medication list given to you today.  Your physician recommends that you return for lab work in: today  Your physician wants you to follow-up in: 6 months. You will receive a reminder letter in the mail two months in advance. If you don't receive a letter, please call our office to schedule the follow-up appointment.  Please talk with your opthalmologic concerning Coumadin instructions then call this office at [phone] to let us know.

## 2013-03-08 ENCOUNTER — Telehealth: Payer: Self-pay | Admitting: Cardiology

## 2013-03-08 NOTE — Telephone Encounter (Signed)
Pt rtn call to lynn from yesterday

## 2013-03-08 NOTE — Telephone Encounter (Signed)
Reviewed lab results and plan of care with patient who verbalized understanding 

## 2013-03-20 ENCOUNTER — Ambulatory Visit (INDEPENDENT_AMBULATORY_CARE_PROVIDER_SITE_OTHER): Payer: Medicare Other

## 2013-03-20 DIAGNOSIS — Z7901 Long term (current) use of anticoagulants: Secondary | ICD-10-CM

## 2013-03-20 DIAGNOSIS — I4891 Unspecified atrial fibrillation: Secondary | ICD-10-CM

## 2013-03-20 LAB — POCT INR: INR: 2.8

## 2013-04-02 ENCOUNTER — Other Ambulatory Visit: Payer: Self-pay | Admitting: Internal Medicine

## 2013-04-15 ENCOUNTER — Other Ambulatory Visit: Payer: Self-pay | Admitting: Cardiology

## 2013-04-15 ENCOUNTER — Other Ambulatory Visit: Payer: Self-pay | Admitting: Internal Medicine

## 2013-04-19 ENCOUNTER — Ambulatory Visit: Payer: Self-pay | Admitting: Family Medicine

## 2013-05-01 ENCOUNTER — Ambulatory Visit (INDEPENDENT_AMBULATORY_CARE_PROVIDER_SITE_OTHER): Payer: Medicare Other | Admitting: General Practice

## 2013-05-01 DIAGNOSIS — I4891 Unspecified atrial fibrillation: Secondary | ICD-10-CM

## 2013-05-01 DIAGNOSIS — Z7901 Long term (current) use of anticoagulants: Secondary | ICD-10-CM

## 2013-05-01 LAB — POCT INR: INR: 2.9

## 2013-06-12 ENCOUNTER — Ambulatory Visit (INDEPENDENT_AMBULATORY_CARE_PROVIDER_SITE_OTHER): Payer: Medicare Other | Admitting: General Practice

## 2013-06-12 DIAGNOSIS — I4891 Unspecified atrial fibrillation: Secondary | ICD-10-CM

## 2013-06-12 DIAGNOSIS — Z7901 Long term (current) use of anticoagulants: Secondary | ICD-10-CM

## 2013-07-08 ENCOUNTER — Ambulatory Visit: Payer: Self-pay | Admitting: Ophthalmology

## 2013-07-08 DIAGNOSIS — I10 Essential (primary) hypertension: Secondary | ICD-10-CM

## 2013-07-08 DIAGNOSIS — I4891 Unspecified atrial fibrillation: Secondary | ICD-10-CM

## 2013-07-08 LAB — PROTIME-INR
INR: 2.5
Prothrombin Time: 26.2 secs — ABNORMAL HIGH (ref 11.5–14.7)

## 2013-07-08 LAB — POTASSIUM: Potassium: 3.6 mmol/L (ref 3.5–5.1)

## 2013-07-15 ENCOUNTER — Ambulatory Visit: Payer: Self-pay | Admitting: Ophthalmology

## 2013-07-24 ENCOUNTER — Ambulatory Visit (INDEPENDENT_AMBULATORY_CARE_PROVIDER_SITE_OTHER): Payer: Medicare Other

## 2013-07-24 DIAGNOSIS — I4891 Unspecified atrial fibrillation: Secondary | ICD-10-CM

## 2013-07-24 DIAGNOSIS — Z7901 Long term (current) use of anticoagulants: Secondary | ICD-10-CM

## 2013-07-24 LAB — POCT INR: INR: 2.8

## 2013-08-18 ENCOUNTER — Other Ambulatory Visit: Payer: Self-pay | Admitting: Cardiology

## 2013-09-04 ENCOUNTER — Ambulatory Visit (INDEPENDENT_AMBULATORY_CARE_PROVIDER_SITE_OTHER): Payer: Medicare Other

## 2013-09-04 ENCOUNTER — Other Ambulatory Visit: Payer: Self-pay | Admitting: Internal Medicine

## 2013-09-04 DIAGNOSIS — Z5181 Encounter for therapeutic drug level monitoring: Secondary | ICD-10-CM | POA: Insufficient documentation

## 2013-09-04 DIAGNOSIS — Z7901 Long term (current) use of anticoagulants: Secondary | ICD-10-CM

## 2013-09-04 DIAGNOSIS — I4891 Unspecified atrial fibrillation: Secondary | ICD-10-CM

## 2013-09-04 LAB — POCT INR: INR: 3.7

## 2013-09-06 ENCOUNTER — Ambulatory Visit (INDEPENDENT_AMBULATORY_CARE_PROVIDER_SITE_OTHER): Payer: Medicare Other | Admitting: Cardiology

## 2013-09-06 ENCOUNTER — Encounter: Payer: Self-pay | Admitting: Cardiology

## 2013-09-06 VITALS — BP 136/84 | HR 58 | Ht 67.0 in | Wt 151.0 lb

## 2013-09-06 DIAGNOSIS — I4891 Unspecified atrial fibrillation: Secondary | ICD-10-CM

## 2013-09-06 DIAGNOSIS — I10 Essential (primary) hypertension: Secondary | ICD-10-CM

## 2013-09-06 DIAGNOSIS — E876 Hypokalemia: Secondary | ICD-10-CM

## 2013-09-06 LAB — BASIC METABOLIC PANEL
BUN: 8 mg/dL (ref 6–23)
CHLORIDE: 91 meq/L — AB (ref 96–112)
CO2: 31 meq/L (ref 19–32)
Calcium: 9.4 mg/dL (ref 8.4–10.5)
Creatinine, Ser: 0.5 mg/dL (ref 0.4–1.2)
GFR: 115.23 mL/min (ref >60.00)
Glucose, Bld: 92 mg/dL (ref 70–99)
Potassium: 3.5 mEq/L (ref 3.5–5.1)
SODIUM: 130 meq/L — AB (ref 135–145)

## 2013-09-06 NOTE — Assessment & Plan Note (Signed)
She is taking 1 potassium dosed daily. We need to check her potassium today.

## 2013-09-06 NOTE — Assessment & Plan Note (Signed)
Blood pressure is controlled. No change in therapy. 

## 2013-09-06 NOTE — Assessment & Plan Note (Signed)
Atrial fibrillation is controlled. No change in therapy. She is on Coumadin. No change in therapy.

## 2013-09-06 NOTE — Progress Notes (Signed)
HPI  Patient is seen today to followup atrial fibrillation. She's actually doing very well. We have followed her potassium. Currently she is on a single dose daily. Potassium will be checked today. She is anticoagulated.  Allergies  Allergen Reactions  . Cortisone     Current Outpatient Prescriptions  Medication Sig Dispense Refill  . amLODipine-benazepril (LOTREL) 5-40 MG per capsule Take 1 capsule by mouth daily.       Marland Kitchen atorvastatin (LIPITOR) 10 MG tablet Take 10 mg by mouth daily.       . cholecalciferol (VITAMIN D) 1000 UNITS tablet Take 1,000 Units by mouth daily.      Marland Kitchen COUMADIN 2.5 MG tablet Take as directed by coumadin clinic  35 tablet  3  . hydrochlorothiazide (MICROZIDE) 12.5 MG capsule TAKE 1 CAPSULE (12.5 MG TOTAL) BY MOUTH DAILY.  90 capsule  1  . latanoprost (XALATAN) 0.005 % ophthalmic solution Place into both eyes daily.       Marland Kitchen losartan (COZAAR) 100 MG tablet TAKE 1 TABLET (100 MG TOTAL) BY MOUTH DAILY.  90 tablet  1  . LYRICA 50 MG capsule Take 50 mg by mouth 2 (two) times daily.       . Potassium Chloride ER 20 MEQ TBCR Take 20 mEq by mouth daily.  90 tablet  1  . potassium chloride SA (KLOR-CON M20) 20 MEQ tablet Take 1 tablet (20 mEq total) by mouth daily.  90 tablet  0  . timolol (TIMOPTIC) 0.5 % ophthalmic solution Place into both eyes daily.        No current facility-administered medications for this visit.    History   Social History  . Marital Status: Married    Spouse Name: N/A    Number of Children: N/A  . Years of Education: N/A   Occupational History  . Not on file.   Social History Main Topics  . Smoking status: Never Smoker   . Smokeless tobacco: Never Used  . Alcohol Use: Yes     Comment: red wine occasionally  . Drug Use: No  . Sexual Activity: Not on file   Other Topics Concern  . Not on file   Social History Narrative  . No narrative on file    History reviewed. No pertinent family history.  Past Medical History    Diagnosis Date  . Shingles   . Dyslipidemia   . Hypertension   . Breast cancer     1985, implant, no chemotherapy or radiation  . Shortness of breath     January, 1610, normal systolic function, question diastolic dysfunction  . Chest pain   . Ejection fraction     EF 55-65%, January, 2012, question diastolic dysfunction  . Mitral regurgitation     Mild, echo, 2012  . Atrial fibrillation     New diagnosis, asymptomatic, February, 2014  . Hypokalemia     February, 2014, potassium started    Past Surgical History  Procedure Laterality Date  . Abdominal hysterectomy      age 65  . Mastectomy      reconstruction inplant as well    Patient Active Problem List   Diagnosis Date Noted  . Encounter for therapeutic drug monitoring 09/04/2013  . Preop cardiovascular exam 03/07/2013  . Hypokalemia   . Long term (current) use of anticoagulants 08/22/2012  . Atrial fibrillation   . Dyslipidemia   . Hypertension   . Breast cancer   . Shortness of breath   .  Chest pain   . Ejection fraction   . Mitral regurgitation   . SHINGLES 07/26/2010    ROS   Patient denies fever, chills, headache, sweats, rash, change in vision, chang chest pain, cough, nausea vomiting, urinary symptoms. All other systems are reviewed and are negative.e in hearing,  PHYSICAL EXAM  patient is stable. She is oriented to person time and place. Affect is normal. There is no jugulovenous distention. Lungs are clear. Respiratory effort is nonlabored. Cardiac exam reveals that the rhythm is irregularly irregular. The rate is controlled. The abdomen is soft. There is no peripheral edema.  Filed Vitals:   09/06/13 1118  BP: 136/84  Pulse: 58  Height: 5\' 7"  (1.702 m)  Weight: 151 lb (68.493 kg)     ASSESSMENT & PLAN

## 2013-09-06 NOTE — Patient Instructions (Addendum)
**Note De-Identified  Obfuscation** Your physician recommends that you continue on your current medications as directed. Please refer to the Current Medication list given to you today.  Your physician recommends that you return for lab work in: today  Your physician wants you to follow-up in: 6 months. You will receive a reminder letter in the mail two months in advance. If you don't receive a letter, please call our office to schedule the follow-up appointment.  Mr. Latasha Anderson will follow up in 6 months as well.

## 2013-09-25 ENCOUNTER — Ambulatory Visit (INDEPENDENT_AMBULATORY_CARE_PROVIDER_SITE_OTHER): Payer: Medicare Other

## 2013-09-25 DIAGNOSIS — Z7901 Long term (current) use of anticoagulants: Secondary | ICD-10-CM

## 2013-09-25 DIAGNOSIS — Z5181 Encounter for therapeutic drug level monitoring: Secondary | ICD-10-CM

## 2013-09-25 DIAGNOSIS — I4891 Unspecified atrial fibrillation: Secondary | ICD-10-CM

## 2013-09-25 LAB — POCT INR: INR: 2.5

## 2013-09-27 ENCOUNTER — Other Ambulatory Visit: Payer: Self-pay | Admitting: Cardiology

## 2013-10-23 ENCOUNTER — Ambulatory Visit (INDEPENDENT_AMBULATORY_CARE_PROVIDER_SITE_OTHER): Payer: Medicare Other

## 2013-10-23 DIAGNOSIS — Z5181 Encounter for therapeutic drug level monitoring: Secondary | ICD-10-CM

## 2013-10-23 DIAGNOSIS — Z7901 Long term (current) use of anticoagulants: Secondary | ICD-10-CM

## 2013-10-23 DIAGNOSIS — I4891 Unspecified atrial fibrillation: Secondary | ICD-10-CM

## 2013-10-23 LAB — POCT INR: INR: 2.8

## 2013-11-20 ENCOUNTER — Ambulatory Visit (INDEPENDENT_AMBULATORY_CARE_PROVIDER_SITE_OTHER): Payer: Medicare Other

## 2013-11-20 DIAGNOSIS — Z5181 Encounter for therapeutic drug level monitoring: Secondary | ICD-10-CM

## 2013-11-20 DIAGNOSIS — I4891 Unspecified atrial fibrillation: Secondary | ICD-10-CM

## 2013-11-20 DIAGNOSIS — Z7901 Long term (current) use of anticoagulants: Secondary | ICD-10-CM

## 2013-11-20 LAB — POCT INR: INR: 3.2

## 2013-12-05 ENCOUNTER — Other Ambulatory Visit: Payer: Self-pay | Admitting: Internal Medicine

## 2013-12-18 ENCOUNTER — Ambulatory Visit (INDEPENDENT_AMBULATORY_CARE_PROVIDER_SITE_OTHER): Payer: Medicare Other

## 2013-12-18 DIAGNOSIS — Z5181 Encounter for therapeutic drug level monitoring: Secondary | ICD-10-CM

## 2013-12-18 DIAGNOSIS — I4891 Unspecified atrial fibrillation: Secondary | ICD-10-CM

## 2013-12-18 DIAGNOSIS — Z7901 Long term (current) use of anticoagulants: Secondary | ICD-10-CM

## 2013-12-18 LAB — POCT INR: INR: 3.1

## 2013-12-30 ENCOUNTER — Other Ambulatory Visit: Payer: Self-pay | Admitting: Cardiology

## 2014-01-15 ENCOUNTER — Ambulatory Visit (INDEPENDENT_AMBULATORY_CARE_PROVIDER_SITE_OTHER): Payer: Medicare Other

## 2014-01-15 ENCOUNTER — Other Ambulatory Visit: Payer: Self-pay | Admitting: *Deleted

## 2014-01-15 DIAGNOSIS — I4891 Unspecified atrial fibrillation: Secondary | ICD-10-CM

## 2014-01-15 DIAGNOSIS — Z5181 Encounter for therapeutic drug level monitoring: Secondary | ICD-10-CM

## 2014-01-15 DIAGNOSIS — Z7901 Long term (current) use of anticoagulants: Secondary | ICD-10-CM

## 2014-01-15 LAB — POCT INR: INR: 3

## 2014-01-15 MED ORDER — COUMADIN 2.5 MG PO TABS: ORAL_TABLET | ORAL | Status: DC

## 2014-01-15 NOTE — Telephone Encounter (Signed)
Refill done as requested 

## 2014-02-12 ENCOUNTER — Ambulatory Visit (INDEPENDENT_AMBULATORY_CARE_PROVIDER_SITE_OTHER): Payer: Medicare Other | Admitting: *Deleted

## 2014-02-12 DIAGNOSIS — Z5181 Encounter for therapeutic drug level monitoring: Secondary | ICD-10-CM

## 2014-02-12 DIAGNOSIS — Z7901 Long term (current) use of anticoagulants: Secondary | ICD-10-CM

## 2014-02-12 DIAGNOSIS — I4891 Unspecified atrial fibrillation: Secondary | ICD-10-CM

## 2014-02-12 LAB — POCT INR: INR: 3

## 2014-03-06 ENCOUNTER — Other Ambulatory Visit: Payer: Self-pay | Admitting: Cardiology

## 2014-03-07 ENCOUNTER — Ambulatory Visit (INDEPENDENT_AMBULATORY_CARE_PROVIDER_SITE_OTHER): Payer: Medicare Other | Admitting: Cardiology

## 2014-03-07 ENCOUNTER — Encounter: Payer: Self-pay | Admitting: Cardiology

## 2014-03-07 VITALS — BP 130/88 | HR 84 | Ht 66.0 in | Wt 148.0 lb

## 2014-03-07 DIAGNOSIS — Z7901 Long term (current) use of anticoagulants: Secondary | ICD-10-CM

## 2014-03-07 DIAGNOSIS — I4891 Unspecified atrial fibrillation: Secondary | ICD-10-CM

## 2014-03-07 DIAGNOSIS — I482 Chronic atrial fibrillation, unspecified: Secondary | ICD-10-CM

## 2014-03-07 DIAGNOSIS — I10 Essential (primary) hypertension: Secondary | ICD-10-CM

## 2014-03-07 DIAGNOSIS — E876 Hypokalemia: Secondary | ICD-10-CM

## 2014-03-07 LAB — BASIC METABOLIC PANEL
BUN: 7 mg/dL (ref 6–23)
CO2: 30 mEq/L (ref 19–32)
CREATININE: 0.6 mg/dL (ref 0.4–1.2)
Calcium: 9.2 mg/dL (ref 8.4–10.5)
Chloride: 92 mEq/L — ABNORMAL LOW (ref 96–112)
GFR: 96.33 mL/min (ref >60.00)
GLUCOSE: 119 mg/dL — AB (ref 70–99)
Potassium: 3.3 mEq/L — ABNORMAL LOW (ref 3.5–5.1)
Sodium: 130 mEq/L — ABNORMAL LOW (ref 135–145)

## 2014-03-07 NOTE — Assessment & Plan Note (Signed)
Atrial fib is controlled. No change in therapy. She is anticoagulated.

## 2014-03-07 NOTE — Assessment & Plan Note (Signed)
Blood pressure is controlled. No change in therapy. 

## 2014-03-07 NOTE — Assessment & Plan Note (Signed)
We will check her potassium again today. She does not like taking the potassium but it appears to be necessary.

## 2014-03-07 NOTE — Patient Instructions (Signed)
Your physician recommends that you continue on your current medications as directed. Please refer to the Current Medication list given to you today.  Your physician recommends that you return for lab work in: today  Your physician wants you to follow-up in: 6 months. You will receive a reminder letter in the mail two months in advance. If you don't receive a letter, please call our office to schedule the follow-up appointment.   

## 2014-03-07 NOTE — Assessment & Plan Note (Signed)
Coumadin will be continued.

## 2014-03-07 NOTE — Progress Notes (Signed)
Patient ID: Latasha Anderson, female   DOB: [birth year], 80 y.o.   MRN: [id number]    HPI  Patient is seen today to followup a history of atrial fibrillation. I saw her last March, 2015. At that time her labs showed that her potassium was 3.5. This was while taking 20 mEq of potassium. I encouraged her to continue this. She is fully active. She has some exertional shortness of breath but she is quite stable.  Allergies  Allergen Reactions  . Cortisone     Current Outpatient Prescriptions  Medication Sig Dispense Refill  . amLODipine-benazepril (LOTREL) 5-40 MG per capsule Take 1 capsule by mouth daily.       Marland Kitchen atorvastatin (LIPITOR) 10 MG tablet Take 10 mg by mouth daily.       . cholecalciferol (VITAMIN D) 1000 UNITS tablet Take 1,000 Units by mouth daily.      Marland Kitchen COUMADIN 2.5 MG tablet Take as directed by coumadin clinic  35 tablet  3  . hydrochlorothiazide (MICROZIDE) 12.5 MG capsule TAKE 1 CAPSULE (12.5 MG TOTAL) BY MOUTH DAILY.  90 capsule  1  . KLOR-CON M20 20 MEQ tablet TAKE 1 TABLET (20 MEQ TOTAL) BY MOUTH DAILY.  90 tablet  0  . latanoprost (XALATAN) 0.005 % ophthalmic solution Place into both eyes daily.       Marland Kitchen losartan (COZAAR) 100 MG tablet TAKE 1 TABLET (100 MG TOTAL) BY MOUTH DAILY.  90 tablet  1  . LYRICA 50 MG capsule Take 50 mg by mouth 2 (two) times daily.       . Potassium Chloride ER 20 MEQ TBCR Take 20 mEq by mouth daily.  90 tablet  1  . timolol (TIMOPTIC) 0.5 % ophthalmic solution Place into both eyes daily.        No current facility-administered medications for this visit.    History   Social History  . Marital Status: Married    Spouse Name: N/A    Number of Children: N/A  . Years of Education: N/A   Occupational History  . Not on file.   Social History Main Topics  . Smoking status: Never Smoker   . Smokeless tobacco: Never Used  . Alcohol Use: Yes     Comment: red wine occasionally  . Drug Use: No  . Sexual Activity: Not on file   Other Topics  Concern  . Not on file   Social History Narrative  . No narrative on file    No family history on file.  Past Medical History  Diagnosis Date  . Shingles   . Dyslipidemia   . Hypertension   . Breast cancer     1985, implant, no chemotherapy or radiation  . Shortness of breath     January, 4196, normal systolic function, question diastolic dysfunction  . Chest pain   . Ejection fraction     EF 55-65%, January, 2012, question diastolic dysfunction  . Mitral regurgitation     Mild, echo, 2012  . Atrial fibrillation     New diagnosis, asymptomatic, February, 2014  . Hypokalemia     February, 2014, potassium started    Past Surgical History  Procedure Laterality Date  . Abdominal hysterectomy      age 73  . Mastectomy      reconstruction inplant as well    Patient Active Problem List   Diagnosis Date Noted  . Encounter for therapeutic drug monitoring 09/04/2013  . Preop cardiovascular exam 03/07/2013  . Hypokalemia   .  Long term (current) use of anticoagulants 08/22/2012  . Atrial fibrillation   . Dyslipidemia   . Hypertension   . Breast cancer   . Shortness of breath   . Chest pain   . Ejection fraction   . Mitral regurgitation   . SHINGLES 07/26/2010    ROS   Patient denies fever, chills, headache, sweats, rash, change in vision, change in hearing, chest pain, cough, nausea vomiting, urinary symptoms. All other systems are reviewed and are negative.  PHYSICAL EXAM  Patient is oriented to person time and place. Affect is normal. She's here with her husband. Head is atraumatic. Sclera and conjunctiva are normal. There is no jugulovenous distention. Lungs are clear. Respiratory effort is nonlabored. Cardiac exam reveals S1 and S2. There no clicks or significant murmurs. The abdomen is soft. There is no peripheral edema.  Filed Vitals:   03/07/14 1516  BP: 130/88  Pulse: 84  Height: 5\' 6"  (1.676 m)  Weight: 148 lb (67.132 kg)   EKG is done today and  reviewed by me. There is no change from the past. She has atrial fib with a controlled rate. There are diffuse nonspecific ST-T wave changes.  ASSESSMENT & PLAN

## 2014-03-12 ENCOUNTER — Ambulatory Visit (INDEPENDENT_AMBULATORY_CARE_PROVIDER_SITE_OTHER): Payer: Medicare Other

## 2014-03-12 DIAGNOSIS — I4891 Unspecified atrial fibrillation: Secondary | ICD-10-CM

## 2014-03-12 DIAGNOSIS — Z5181 Encounter for therapeutic drug level monitoring: Secondary | ICD-10-CM

## 2014-03-12 DIAGNOSIS — Z7901 Long term (current) use of anticoagulants: Secondary | ICD-10-CM

## 2014-03-12 LAB — POCT INR: INR: 3.1

## 2014-03-14 ENCOUNTER — Ambulatory Visit: Payer: Medicare Other | Admitting: Cardiology

## 2014-03-27 ENCOUNTER — Other Ambulatory Visit: Payer: Self-pay | Admitting: Cardiology

## 2014-04-09 ENCOUNTER — Ambulatory Visit (INDEPENDENT_AMBULATORY_CARE_PROVIDER_SITE_OTHER): Payer: Medicare Other

## 2014-04-09 DIAGNOSIS — Z7901 Long term (current) use of anticoagulants: Secondary | ICD-10-CM

## 2014-04-09 DIAGNOSIS — Z5181 Encounter for therapeutic drug level monitoring: Secondary | ICD-10-CM

## 2014-04-09 DIAGNOSIS — I4891 Unspecified atrial fibrillation: Secondary | ICD-10-CM

## 2014-04-09 LAB — POCT INR: INR: 2.7

## 2014-04-25 ENCOUNTER — Ambulatory Visit: Payer: Self-pay | Admitting: Family Medicine

## 2014-05-07 ENCOUNTER — Ambulatory Visit (INDEPENDENT_AMBULATORY_CARE_PROVIDER_SITE_OTHER): Payer: Medicare Other

## 2014-05-07 DIAGNOSIS — I4891 Unspecified atrial fibrillation: Secondary | ICD-10-CM

## 2014-05-07 DIAGNOSIS — Z5181 Encounter for therapeutic drug level monitoring: Secondary | ICD-10-CM

## 2014-05-07 DIAGNOSIS — Z7901 Long term (current) use of anticoagulants: Secondary | ICD-10-CM

## 2014-05-07 LAB — POCT INR: INR: 2.6

## 2014-05-23 ENCOUNTER — Other Ambulatory Visit: Payer: Self-pay | Admitting: Cardiology

## 2014-06-02 ENCOUNTER — Other Ambulatory Visit: Payer: Self-pay | Admitting: Cardiology

## 2014-06-04 ENCOUNTER — Ambulatory Visit (INDEPENDENT_AMBULATORY_CARE_PROVIDER_SITE_OTHER): Payer: Medicare Other

## 2014-06-04 DIAGNOSIS — Z7901 Long term (current) use of anticoagulants: Secondary | ICD-10-CM

## 2014-06-04 DIAGNOSIS — I4891 Unspecified atrial fibrillation: Secondary | ICD-10-CM

## 2014-06-04 DIAGNOSIS — Z5181 Encounter for therapeutic drug level monitoring: Secondary | ICD-10-CM

## 2014-06-04 LAB — POCT INR: INR: 2.5

## 2014-07-16 ENCOUNTER — Ambulatory Visit (INDEPENDENT_AMBULATORY_CARE_PROVIDER_SITE_OTHER): Payer: Medicare Other

## 2014-07-16 DIAGNOSIS — Z5181 Encounter for therapeutic drug level monitoring: Secondary | ICD-10-CM

## 2014-07-16 DIAGNOSIS — Z7901 Long term (current) use of anticoagulants: Secondary | ICD-10-CM

## 2014-07-16 DIAGNOSIS — I4891 Unspecified atrial fibrillation: Secondary | ICD-10-CM

## 2014-07-16 LAB — POCT INR: INR: 2.8

## 2014-08-27 ENCOUNTER — Ambulatory Visit (INDEPENDENT_AMBULATORY_CARE_PROVIDER_SITE_OTHER): Payer: Medicare Other

## 2014-08-27 DIAGNOSIS — I4891 Unspecified atrial fibrillation: Secondary | ICD-10-CM

## 2014-08-27 DIAGNOSIS — Z7901 Long term (current) use of anticoagulants: Secondary | ICD-10-CM

## 2014-08-27 DIAGNOSIS — Z5181 Encounter for therapeutic drug level monitoring: Secondary | ICD-10-CM

## 2014-08-27 LAB — POCT INR: INR: 2

## 2014-08-29 ENCOUNTER — Other Ambulatory Visit: Payer: Self-pay | Admitting: Cardiology

## 2014-09-17 ENCOUNTER — Other Ambulatory Visit: Payer: Self-pay | Admitting: Cardiology

## 2014-09-19 ENCOUNTER — Ambulatory Visit (INDEPENDENT_AMBULATORY_CARE_PROVIDER_SITE_OTHER): Payer: Medicare Other | Admitting: Cardiology

## 2014-09-19 ENCOUNTER — Encounter: Payer: Self-pay | Admitting: Cardiology

## 2014-09-19 VITALS — BP 124/68 | HR 91 | Ht 66.0 in | Wt 147.0 lb

## 2014-09-19 DIAGNOSIS — I482 Chronic atrial fibrillation, unspecified: Secondary | ICD-10-CM

## 2014-09-19 DIAGNOSIS — I10 Essential (primary) hypertension: Secondary | ICD-10-CM

## 2014-09-19 DIAGNOSIS — E876 Hypokalemia: Secondary | ICD-10-CM

## 2014-09-19 NOTE — Assessment & Plan Note (Signed)
Blood pressures controlled. No change in therapy. 

## 2014-09-19 NOTE — Assessment & Plan Note (Signed)
Patient's potassium is treated. No change in therapy.

## 2014-09-19 NOTE — Progress Notes (Signed)
Cardiology Office Note   Date:  09/19/2014   ID:  Latasha Anderson 1930-11-28, MRN [id number]  PCP:  Miguel Aschoff, MD  Cardiologist:  Dola Argyle, MD   Chief Complaint  Patient presents with  . Appointment    Follow-up atrial fibrillation      History of Present Illness: Latasha Anderson is a 81 y.o. female who presents today to follow up atrial fibrillation. She's doing well. I saw her last September, 2015. She needs potassium. She was hasn't had but after repeat labs she has agreed to take it. She does not feel her palpitations. She is anticoagulated.    Past Medical History  Diagnosis Date  . Shingles   . Dyslipidemia   . Hypertension   . Breast cancer     1985, implant, no chemotherapy or radiation  . Shortness of breath     January, 0814, normal systolic function, question diastolic dysfunction  . Chest pain   . Ejection fraction     EF 55-65%, January, 2012, question diastolic dysfunction  . Mitral regurgitation     Mild, echo, 2012  . Atrial fibrillation     New diagnosis, asymptomatic, February, 2014  . Hypokalemia     February, 2014, potassium started    Past Surgical History  Procedure Laterality Date  . Abdominal hysterectomy      age 43  . Mastectomy      reconstruction inplant as well    Patient Active Problem List   Diagnosis Date Noted  . Encounter for therapeutic drug monitoring 09/04/2013  . Preop cardiovascular exam 03/07/2013  . Hypokalemia   . Long term (current) use of anticoagulants 08/22/2012  . Atrial fibrillation   . Dyslipidemia   . Hypertension   . Breast cancer   . Shortness of breath   . Chest pain   . Ejection fraction   . Mitral regurgitation   . SHINGLES 07/26/2010      Current Outpatient Prescriptions  Medication Sig Dispense Refill  . amLODipine-benazepril (LOTREL) 5-40 MG per capsule Take 1 capsule by mouth daily.     Marland Kitchen atorvastatin (LIPITOR) 10 MG tablet Take 10 mg by mouth daily.     .  cholecalciferol (VITAMIN D) 1000 UNITS tablet Take 1,000 Units by mouth daily.    Marland Kitchen COUMADIN 2.5 MG tablet TAKE AS DIRECTED BY COUMADIN CLINIC 35 tablet 3  . hydrochlorothiazide (MICROZIDE) 12.5 MG capsule TAKE 1 CAPSULE (12.5 MG TOTAL) BY MOUTH DAILY. 90 capsule 0  . KLOR-CON M20 20 MEQ tablet TAKE 1 TABLET (20 MEQ TOTAL) BY MOUTH DAILY. 90 tablet 0  . latanoprost (XALATAN) 0.005 % ophthalmic solution Place into both eyes daily.     Marland Kitchen losartan (COZAAR) 100 MG tablet TAKE 1 TABLET (100 MG TOTAL) BY MOUTH DAILY. 90 tablet 0  . Potassium Chloride ER 20 MEQ TBCR Take 20 mEq by mouth daily. 90 tablet 1  . timolol (TIMOPTIC) 0.5 % ophthalmic solution Place into both eyes daily.      No current facility-administered medications for this visit.    Allergies:   Cortisone    Social History:  The patient  reports that she has never smoked. She has never used smokeless tobacco. She reports that she drinks alcohol. She reports that she does not use illicit drugs.   Family History:  The patient's family history includes Diabetes in her father; Stroke in her mother.    ROS:  Please see the history of present illness.  Patient denies fever, chills, headache, sweats, rash, change in vision, change in hearing, chest pain, cough, nausea or vomiting, urinary symptoms. All other systems are reviewed and are negative.   PHYSICAL EXAM: VS:  BP 124/68 mmHg  Pulse 91  Ht 5\' 6"  (1.676 m)  Wt 147 lb (66.679 kg)  BMI 23.74 kg/m2 , Patient is here with her husband. She is oriented to person time and place. Affect is normal. Head is atraumatic. Sclera and conjunctiva are normal. There is no jugulovenous distention. Lungs are clear. Respiratory effort is not labored. Cardiac exam reveals S1 and S2. Abdomen is soft. The is no peripheral edema. There are no musculoskeletal deformities. There are no skin rashes. EKG:      Recent Labs: 03/07/2014: BUN 7; Creatinine 0.6; Potassium 3.3*; Sodium 130*    Lipid  Panel No results found for: CHOL, TRIG, HDL, CHOLHDL, VLDL, LDLCALC, LDLDIRECT    Wt Readings from Last 3 Encounters:  09/19/14 147 lb (66.679 kg)  03/07/14 148 lb (67.132 kg)  09/06/13 151 lb (68.493 kg)      Current medicines are reviewed  The patient understands her medications.     ASSESSMENT AND PLAN:

## 2014-09-19 NOTE — Patient Instructions (Signed)
Your physician recommends that you continue on your current medications as directed. Please refer to the Current Medication list given to you today.  Your physician recommends that you schedule a follow-up appointment in: to be determined at your husband's next office visit with Dr Ron Parker.

## 2014-09-19 NOTE — Assessment & Plan Note (Signed)
Atrial fibrillation rate is controlled. She is anticoagulated. No further workup.

## 2014-10-08 ENCOUNTER — Ambulatory Visit (INDEPENDENT_AMBULATORY_CARE_PROVIDER_SITE_OTHER): Payer: Medicare Other

## 2014-10-08 DIAGNOSIS — I4891 Unspecified atrial fibrillation: Secondary | ICD-10-CM | POA: Diagnosis not present

## 2014-10-08 DIAGNOSIS — Z5181 Encounter for therapeutic drug level monitoring: Secondary | ICD-10-CM

## 2014-10-08 DIAGNOSIS — Z7901 Long term (current) use of anticoagulants: Secondary | ICD-10-CM | POA: Diagnosis not present

## 2014-10-08 LAB — POCT INR: INR: 2.9

## 2014-10-25 ENCOUNTER — Other Ambulatory Visit: Payer: Self-pay | Admitting: Cardiology

## 2014-10-25 NOTE — Op Note (Signed)
PATIENT NAME:  ROSELINE, Latasha Anderson MR#:  [id number] DATE OF BIRTH:  July 01, 1931  DATE OF PROCEDURE:  07/15/2013  PREOPERATIVE DIAGNOSIS: Cataract, right eye.   POSTOPERATIVE DIAGNOSIS: Cataract, right eye.   PROCEDURE PERFORMED: Extracapsular cataract extraction using phacoemulsification with placement of Alcon [id number], 20.0-diopter posterior chamber lens with 3 diopters of cylinder, serial number [id number].   SURGEON: Loura Back. Joshlynn Alfonzo, M.D.   ANESTHESIA: Was 4% lidocaine and 0.75% Marcaine with a 50:50 mixture with 10 units/mL of Hylenex added given as a peribulbar.   ANESTHESIOLOGIST: Dr. Myra Gianotti.   COMPLICATIONS: None.   ESTIMATED BLOOD LOSS: Less than 1 mL.   DESCRIPTION OF PROCEDURE: The patient was brought to the operating room and each eye was anesthetized with topical proparacaine. The patient was set upright with the legs dangling over the edge of the bed. Fixing at a distant target, the 3 o'clock and 9 o'clock positions were marked on the limbus with a marking pen. The patient was then placed supine and placed into position and given IV sedation and peribulbar block. She was then prepped and draped in the usual fashion.   Vertical rectus muscles were imbricated using 5-0 silk sutures, bridle sutures. A Toric marker was brought to the table and lined up with the 3 o'clock and 9 o'clock positions and the 15-degree and 105-degree positions were marked on the cornea. The limbus was cauterized with a pencil cautery. A partial-thickness scleral groove was centered on the 105-degree mark. This was dissected anteriorly into clear cornea with an Alcon crescent knife. The anterior chamber was entered superonasally through clear cornea with a paracentesis knife and through the lamellar dissection with a 2.6 mm keratome. DisCoVisc was used to place the aqueous. A continuous tear circular capsulorrhexis was carried out. Hydrodissection was used to loosen the nucleus and phacoemulsification was carried  out in a divide-and-conquer technique.   ULTRASOUND TIME: Was 1 minute and 43 seconds with an average power of 25.6%. CDE 48.10.   Irrigation-aspiration was used to remove the residual cortex. The capsular bag was inflated with DisCoVisc and the intraocular lens was inserted using a Librarian, academic. The lens was rotated, lining the haptics up with the 15-degree mark. Irrigation-aspiration was used to remove the residual DisCoVisc. Care was taken to make sure that the lens did not shift.   The wound was hydrated with balanced salt and Miostat was injected through the paracentesis track. The wound was checked for leaks. None were found. A tenth of a milliliter of cefuroxime containing 1 mg of drug was injected via the paracentesis track. This was done after Miostat had been injected. The wound was checked for leaks again and none were found. The bridle sutures were removed. Three drops of Vigamox were placed in the eye. A shield was placed on the eye. The patient was discharged to the recovery room in good condition.   ____________________________ Loura Back Amparo Donalson, MD sad:np D: 07/15/2013 13:27:23 ET T: 07/15/2013 21:00:18 ET JOB#: [id number]  cc: Remo Lipps A. Shenita Trego, MD, <Dictator> Martie Lee MD ELECTRONICALLY SIGNED 07/22/2013 13:27

## 2014-11-19 ENCOUNTER — Ambulatory Visit (INDEPENDENT_AMBULATORY_CARE_PROVIDER_SITE_OTHER): Payer: Medicare Other

## 2014-11-19 DIAGNOSIS — Z5181 Encounter for therapeutic drug level monitoring: Secondary | ICD-10-CM | POA: Diagnosis not present

## 2014-11-19 DIAGNOSIS — I4891 Unspecified atrial fibrillation: Secondary | ICD-10-CM | POA: Diagnosis not present

## 2014-11-19 DIAGNOSIS — Z7901 Long term (current) use of anticoagulants: Secondary | ICD-10-CM

## 2014-11-19 LAB — POCT INR: INR: 2.7

## 2014-11-23 ENCOUNTER — Other Ambulatory Visit: Payer: Self-pay | Admitting: Cardiology

## 2014-12-04 ENCOUNTER — Other Ambulatory Visit: Payer: Self-pay | Admitting: Cardiology

## 2014-12-13 ENCOUNTER — Other Ambulatory Visit: Payer: Self-pay | Admitting: Cardiology

## 2014-12-15 ENCOUNTER — Encounter: Payer: Self-pay | Admitting: *Deleted

## 2014-12-15 DIAGNOSIS — M810 Age-related osteoporosis without current pathological fracture: Secondary | ICD-10-CM | POA: Insufficient documentation

## 2014-12-15 DIAGNOSIS — R739 Hyperglycemia, unspecified: Secondary | ICD-10-CM | POA: Insufficient documentation

## 2014-12-15 DIAGNOSIS — R5383 Other fatigue: Secondary | ICD-10-CM | POA: Insufficient documentation

## 2014-12-15 DIAGNOSIS — C50919 Malignant neoplasm of unspecified site of unspecified female breast: Secondary | ICD-10-CM | POA: Insufficient documentation

## 2014-12-15 DIAGNOSIS — H409 Unspecified glaucoma: Secondary | ICD-10-CM | POA: Insufficient documentation

## 2014-12-15 DIAGNOSIS — K219 Gastro-esophageal reflux disease without esophagitis: Secondary | ICD-10-CM | POA: Insufficient documentation

## 2014-12-15 DIAGNOSIS — H9190 Unspecified hearing loss, unspecified ear: Secondary | ICD-10-CM | POA: Insufficient documentation

## 2014-12-15 DIAGNOSIS — E785 Hyperlipidemia, unspecified: Secondary | ICD-10-CM | POA: Insufficient documentation

## 2014-12-15 DIAGNOSIS — R1010 Upper abdominal pain, unspecified: Secondary | ICD-10-CM | POA: Insufficient documentation

## 2014-12-15 DIAGNOSIS — M1990 Unspecified osteoarthritis, unspecified site: Secondary | ICD-10-CM | POA: Insufficient documentation

## 2014-12-15 DIAGNOSIS — I10 Essential (primary) hypertension: Secondary | ICD-10-CM | POA: Insufficient documentation

## 2014-12-15 DIAGNOSIS — Z23 Encounter for immunization: Secondary | ICD-10-CM | POA: Insufficient documentation

## 2014-12-15 DIAGNOSIS — N952 Postmenopausal atrophic vaginitis: Secondary | ICD-10-CM | POA: Insufficient documentation

## 2014-12-22 ENCOUNTER — Ambulatory Visit: Payer: Self-pay | Admitting: Family Medicine

## 2014-12-24 ENCOUNTER — Encounter: Payer: Self-pay | Admitting: Family Medicine

## 2014-12-24 ENCOUNTER — Ambulatory Visit (INDEPENDENT_AMBULATORY_CARE_PROVIDER_SITE_OTHER): Payer: Medicare Other | Admitting: Family Medicine

## 2014-12-24 VITALS — BP 142/70 | HR 72 | Temp 97.6°F | Resp 16 | Ht 66.0 in | Wt 147.0 lb

## 2014-12-24 DIAGNOSIS — I10 Essential (primary) hypertension: Secondary | ICD-10-CM | POA: Diagnosis not present

## 2014-12-24 MED ORDER — AMLODIPINE BESY-BENAZEPRIL HCL 5-20 MG PO CAPS: 2.0000 | ORAL_CAPSULE | Freq: Every day | ORAL | Status: DC

## 2014-12-24 NOTE — Progress Notes (Signed)
Patient ID: Latasha Anderson, female   DOB: 1930/09/19, 81 y.o.   MRN: [id number]   Latasha Anderson  MRN: [id number] DOB: [birth year]  Subjective:  HPI  1. Essential hypertension  Patient presents with questions regarding her blood pressure medications.  She was recently seen by Dr. Georgiann Hahn and he had questions and suggestions for some changes in her medications and wanted her to check it with you.  Currently the patient is on HCTZ 12.5 mg daily, Amlodipine Benazepril 5/40 mg daily and Losartan 100 mg daily.  His recommendation was to increase the dose of Amlodipine Benazepril and discontinue the Losartan.    While reconciling the patients medications it was noted that the list she brought from Dr. Georgiann Hahn' office included two potassium medications.  KLor Con 20 mEq and Potassium Chloride 20 mEq. She had been taking 2 of the Klor Con and 1 Potassium Chloride and Dr. Georgiann Hahn recently told her to go to 1 Klor Con.   Patient Active Problem List   Diagnosis Date Noted  . Atrophic vaginitis 12/15/2014  . Breast CA 12/15/2014  . Decrease in the ability to hear 12/15/2014  . Essential (primary) hypertension 12/15/2014  . Fatigue 12/15/2014  . Acid reflux 12/15/2014  . Glaucoma 12/15/2014  . Flu vaccine need 12/15/2014  . Blood glucose elevated 12/15/2014  . Arthritis, degenerative 12/15/2014  . OP (osteoporosis) 12/15/2014  . HLD (hyperlipidemia) 12/15/2014  . Pain of upper abdomen 12/15/2014  . Encounter for therapeutic drug monitoring 09/04/2013  . Preop cardiovascular exam 03/07/2013  . Hypokalemia   . Long term (current) use of anticoagulants 08/22/2012  . Atrial fibrillation   . Dyslipidemia   . Hypertension   . Breast cancer   . Shortness of breath   . Chest pain   . Ejection fraction   . Mitral regurgitation   . SHINGLES 07/26/2010    Past Medical History  Diagnosis Date  . Shingles   . Dyslipidemia   . Hypertension   . Breast cancer     1985, implant, no chemotherapy or  radiation  . Shortness of breath     January, 2993, normal systolic function, question diastolic dysfunction  . Chest pain   . Ejection fraction     EF 55-65%, January, 2012, question diastolic dysfunction  . Mitral regurgitation     Mild, echo, 2012  . Atrial fibrillation     New diagnosis, asymptomatic, February, 2014  . Hypokalemia     February, 2014, potassium started    History   Social History  . Marital Status: Married    Spouse Name: N/A  . Number of Children: N/A  . Years of Education: N/A   Occupational History  . Not on file.   Social History Main Topics  . Smoking status: Never Smoker   . Smokeless tobacco: Never Used  . Alcohol Use: No     Comment: red wine occasionally  . Drug Use: No  . Sexual Activity: Not on file   Other Topics Concern  . Not on file   Social History Narrative    Outpatient Prescriptions Prior to Visit  Medication Sig Dispense Refill  . amLODipine-benazepril (LOTREL) 5-40 MG per capsule Take 1 capsule by mouth daily.     Marland Kitchen atorvastatin (LIPITOR) 10 MG tablet Take 10 mg by mouth daily.     . cholecalciferol (VITAMIN D) 1000 UNITS tablet Take 1,000 Units by mouth daily.    Marland Kitchen COUMADIN 2.5 MG tablet TAKE AS DIRECTED BY COUMADIN CLINIC  35 tablet 3  . hydrochlorothiazide (MICROZIDE) 12.5 MG capsule TAKE 1 CAPSULE (12.5 MG TOTAL) BY MOUTH DAILY. 90 capsule 1  . KLOR-CON M20 20 MEQ tablet TAKE 1 TABLET (20 MEQ TOTAL) BY MOUTH DAILY. 90 tablet 0  . latanoprost (XALATAN) 0.005 % ophthalmic solution Place into both eyes daily.     Marland Kitchen losartan (COZAAR) 100 MG tablet TAKE 1 TABLET (100 MG TOTAL) BY MOUTH DAILY. 90 tablet 1  . Potassium Chloride ER 20 MEQ TBCR Take 20 mEq by mouth daily. 90 tablet 1  . timolol (TIMOPTIC) 0.5 % ophthalmic solution Place into both eyes daily.      No facility-administered medications prior to visit.    Allergies  Allergen Reactions  . Ciprofloxacin Nausea And Vomiting  . Cortisone     Pt stated this makes  her turn blue  . Nitrofurantoin Monohyd Macro     GI upset & chills.  . Other     Mycins    Review of Systems  Constitutional: Negative.   Respiratory: Negative.   Cardiovascular: Positive for leg swelling.       Some dyspnea on exertion, which is chronic and unchanged.  Occasional lightheadedness when she gets up from chair or bed and it lasts only a few seconds.   Objective:  BP 142/70 mmHg  Pulse 72  Temp(Src) 97.6 F (36.4 C) (Oral)  Resp 16  Ht 5\' 6"  (1.676 m)  Wt 147 lb (66.679 kg)  BMI 23.74 kg/m2  Physical Exam  Constitutional: She is oriented to person, place, and time and well-developed, well-nourished, and in no distress.  HENT:  Head: Normocephalic and atraumatic.  Right Ear: External ear normal.  Left Ear: External ear normal.  Nose: Nose normal.  Mouth/Throat: Oropharynx is clear and moist.  Eyes: Conjunctivae and EOM are normal. Pupils are equal, round, and reactive to light.  Neck: Normal range of motion. Neck supple.  Cardiovascular: Normal rate, regular rhythm and normal heart sounds.   Pulmonary/Chest: Effort normal and breath sounds normal.  Abdominal: Soft. Bowel sounds are normal.  Neurological: She is alert and oriented to person, place, and time. Gait normal.  Skin: Skin is warm and dry.  Psychiatric: Mood, memory, affect and judgment normal.    Assessment and Plan :   1. Essential hypertension Patient is to discontinue Losartan and we will increase dose of Amlodipine Benazepril.  - amlodipine-benazepril (LOTREL) 5-20 MG per capsule; Take 2 capsules by mouth daily.  Dispense: 60 capsule; Refill: 12  Patient is to discontinue Klor Con and continue with her Potassium Chloride 20 mEq 1 daily.   Miguel Aschoff MD [facility] 12/24/2014 10:47 AM

## 2014-12-31 ENCOUNTER — Ambulatory Visit (INDEPENDENT_AMBULATORY_CARE_PROVIDER_SITE_OTHER): Payer: Medicare Other

## 2014-12-31 ENCOUNTER — Telehealth: Payer: Self-pay | Admitting: Family Medicine

## 2014-12-31 DIAGNOSIS — Z7901 Long term (current) use of anticoagulants: Secondary | ICD-10-CM | POA: Diagnosis not present

## 2014-12-31 DIAGNOSIS — Z5181 Encounter for therapeutic drug level monitoring: Secondary | ICD-10-CM

## 2014-12-31 DIAGNOSIS — I4891 Unspecified atrial fibrillation: Secondary | ICD-10-CM

## 2014-12-31 LAB — POCT INR: INR: 2.3

## 2014-12-31 NOTE — Telephone Encounter (Signed)
Pt called about getting authorization on her medication.  Not sure which one she is talking about.  Please call her.  [phone]. tp

## 2014-12-31 NOTE — Telephone Encounter (Signed)
Spoke with patient. PA is needed due to sig changing. Will talk to Dr. Rosanna Randy about this and what needs to be done. Patient wants to rather take 1 daily and pharmacist questioned.-aa

## 2015-01-07 ENCOUNTER — Other Ambulatory Visit: Payer: Self-pay

## 2015-01-07 MED ORDER — AMLODIPINE BESY-BENAZEPRIL HCL 10-40 MG PO CAPS: 1.0000 | ORAL_CAPSULE | Freq: Every day | ORAL | Status: DC

## 2015-01-07 NOTE — Telephone Encounter (Signed)
Spoke with Dr. Dellia Beckwith in RX for 10-40 mg 1 po QD. Advised patient, hopefully this will get approved-aa

## 2015-01-07 NOTE — Telephone Encounter (Signed)
Medication filled see other message also

## 2015-02-11 ENCOUNTER — Ambulatory Visit (INDEPENDENT_AMBULATORY_CARE_PROVIDER_SITE_OTHER): Payer: Medicare Other | Admitting: *Deleted

## 2015-02-11 DIAGNOSIS — Z7901 Long term (current) use of anticoagulants: Secondary | ICD-10-CM | POA: Diagnosis not present

## 2015-02-11 DIAGNOSIS — I4891 Unspecified atrial fibrillation: Secondary | ICD-10-CM

## 2015-02-11 DIAGNOSIS — Z5181 Encounter for therapeutic drug level monitoring: Secondary | ICD-10-CM

## 2015-02-11 LAB — POCT INR: INR: 3.1

## 2015-02-12 ENCOUNTER — Ambulatory Visit (INDEPENDENT_AMBULATORY_CARE_PROVIDER_SITE_OTHER): Payer: Medicare Other | Admitting: Family Medicine

## 2015-02-12 ENCOUNTER — Encounter: Payer: Self-pay | Admitting: Family Medicine

## 2015-02-12 VITALS — BP 114/62 | HR 80 | Temp 98.1°F | Resp 14 | Wt 149.0 lb

## 2015-02-12 DIAGNOSIS — R739 Hyperglycemia, unspecified: Secondary | ICD-10-CM

## 2015-02-12 DIAGNOSIS — I10 Essential (primary) hypertension: Secondary | ICD-10-CM

## 2015-02-12 DIAGNOSIS — E876 Hypokalemia: Secondary | ICD-10-CM | POA: Diagnosis not present

## 2015-02-12 DIAGNOSIS — C50911 Malignant neoplasm of unspecified site of right female breast: Secondary | ICD-10-CM

## 2015-02-12 MED ORDER — POTASSIUM CHLORIDE ER 20 MEQ PO TBCR: 20.0000 meq | EXTENDED_RELEASE_TABLET | Freq: Every day | ORAL | Status: DC

## 2015-02-12 NOTE — Progress Notes (Signed)
Patient ID: Latasha Anderson, female   DOB: 02-15-1931, 81 y.o.   MRN: [id number]    Subjective:  HPI   Hypertension, follow-up:  BP Readings from Last 3 Encounters:  02/12/15 114/62  12/24/14 142/70  09/19/14 124/68    She was last seen for hypertension 2 months ago.  BP at that visit was 142/70. Management changes since that visit include stopping Losartan and increasing Lotrel to 10-40 mg per cardiology recommendation. She reports good compliance with treatment. She is not having side effects.  Outside blood pressures are around 110s/70s. She is experiencing has felt lightheaded in the mornings when she gets up but after she gets going this has improved. Sh is alo having swelling in her lower legs without pain..     Weight trend: stable Wt Readings from Last 3 Encounters:  02/12/15 149 lb (67.586 kg)  12/24/14 147 lb (66.679 kg)  09/19/14 147 lb (66.679 kg)    She also had a question about her potassium medication.     Prior to Admission medications   Medication Sig Start Date End Date Taking? Authorizing Provider  amLODipine-benazepril (LOTREL) 10-40 MG per capsule Take 1 capsule by mouth daily. 01/07/15   Richard Maceo Pro., MD  atorvastatin (LIPITOR) 10 MG tablet Take 10 mg by mouth daily.  08/06/12   Historical Provider, MD  cholecalciferol (VITAMIN D) 1000 UNITS tablet Take 1,000 Units by mouth daily.    Historical Provider, MD  COUMADIN 2.5 MG tablet TAKE AS DIRECTED BY COUMADIN CLINIC 10/27/14   Carlena Bjornstad, MD  hydrochlorothiazide (MICROZIDE) 12.5 MG capsule TAKE 1 CAPSULE (12.5 MG TOTAL) BY MOUTH DAILY. 12/15/14   Carlena Bjornstad, MD  latanoprost (XALATAN) 0.005 % ophthalmic solution Place into both eyes daily.  08/01/12   Historical Provider, MD  Potassium Chloride ER 20 MEQ TBCR Take 20 mEq by mouth daily. 11/02/12   Carlena Bjornstad, MD  timolol (TIMOPTIC) 0.5 % ophthalmic solution Place into both eyes daily.  08/05/12   Historical Provider, MD    Patient  Active Problem List   Diagnosis Date Noted  . Atrophic vaginitis 12/15/2014  . Breast CA 12/15/2014  . Decrease in the ability to hear 12/15/2014  . Essential (primary) hypertension 12/15/2014  . Fatigue 12/15/2014  . Acid reflux 12/15/2014  . Glaucoma 12/15/2014  . Flu vaccine need 12/15/2014  . Blood glucose elevated 12/15/2014  . Arthritis, degenerative 12/15/2014  . OP (osteoporosis) 12/15/2014  . HLD (hyperlipidemia) 12/15/2014  . Pain of upper abdomen 12/15/2014  . Encounter for therapeutic drug monitoring 09/04/2013  . Preop cardiovascular exam 03/07/2013  . Hypokalemia   . Long term (current) use of anticoagulants 08/22/2012  . Atrial fibrillation   . Dyslipidemia   . Hypertension   . Breast cancer   . Shortness of breath   . Chest pain   . Ejection fraction   . Mitral regurgitation   . SHINGLES 07/26/2010    Past Medical History  Diagnosis Date  . Shingles   . Dyslipidemia   . Hypertension   . Breast cancer     1985, implant, no chemotherapy or radiation  . Shortness of breath     January, 4496, normal systolic function, question diastolic dysfunction  . Chest pain   . Ejection fraction     EF 55-65%, January, 2012, question diastolic dysfunction  . Mitral regurgitation     Mild, echo, 2012  . Atrial fibrillation     New diagnosis, asymptomatic, February, 2014  .  Hypokalemia     February, 2014, potassium started    Social History   Social History  . Marital Status: Married    Spouse Name: N/A  . Number of Children: N/A  . Years of Education: N/A   Occupational History  . Not on file.   Social History Main Topics  . Smoking status: Never Smoker   . Smokeless tobacco: Never Used  . Alcohol Use: No     Comment: red wine occasionally  . Drug Use: No  . Sexual Activity: No   Other Topics Concern  . Not on file   Social History Narrative    Allergies  Allergen Reactions  . Ciprofloxacin Nausea And Vomiting  . Cortisone     Pt stated  this makes her turn blue  . Nitrofurantoin Monohyd Macro     GI upset & chills.  . Other     Mycins    Review of Systems  Constitutional: Negative for fever, chills and malaise/fatigue.  Eyes: Negative.   Respiratory: Negative for cough, hemoptysis, sputum production and wheezing.   Cardiovascular: Positive for leg swelling. Negative for chest pain, palpitations and orthopnea.  Gastrointestinal: Negative for heartburn, nausea and vomiting.  Musculoskeletal: Negative for myalgias, back pain, joint pain, falls and neck pain.  Neurological: Positive for dizziness. Negative for tingling, tremors, weakness and headaches.  Endo/Heme/Allergies: Negative.   Psychiatric/Behavioral: Negative.      There is no immunization history on file for this patient. Objective:  BP 114/62 mmHg  Pulse 80  Temp(Src) 98.1 F (36.7 C)  Resp 14  Wt 149 lb (67.586 kg)  Physical Exam  Constitutional: She is oriented to person, place, and time and well-developed, well-nourished, and in no distress.  HENT:  Head: Normocephalic.  Eyes: Conjunctivae are normal. Pupils are equal, round, and reactive to light.  Neck: Normal range of motion. Neck supple.  Cardiovascular: Normal rate, regular rhythm, normal heart sounds and intact distal pulses.   No murmur heard. Pulmonary/Chest: Effort normal and breath sounds normal. No respiratory distress. She has no wheezes. She has no rales.  Abdominal: Soft. Bowel sounds are normal.  Musculoskeletal: Normal range of motion. She exhibits no tenderness.  Non pitting edema from Lotrel high dose. Significant edema of the feet and ankles from the amlodipine. This is not pitting edema or regular venous insufficiency.  Neurological: She is alert and oriented to person, place, and time.  Psychiatric: Mood, memory, affect and judgment normal.    Lab Results  Component Value Date   WBC 6.9 08/20/2012   HGB 15.2* 08/20/2012   HCT 45.0 08/20/2012   PLT 283.0 08/20/2012    GLUCOSE 119* 03/07/2014   TSH 1.70 08/20/2012   INR 3.1 02/11/2015    CMP     Component Value Date/Time   NA 130* 03/07/2014 1543   NA 135 10/31/2012 0942   K 3.3* 03/07/2014 1543   K 3.6 07/08/2013 1159   CL 92* 03/07/2014 1543   CO2 30 03/07/2014 1543   GLUCOSE 119* 03/07/2014 1543   GLUCOSE 114* 10/31/2012 0942   BUN 7 03/07/2014 1543   BUN 6* 10/31/2012 0942   CREATININE 0.6 03/07/2014 1543   CALCIUM 9.2 03/07/2014 1543   PROT 6.6 08/20/2012 1040   ALBUMIN 4.0 08/20/2012 1040   AST 20 08/20/2012 1040   ALT 18 08/20/2012 1040   ALKPHOS 59 08/20/2012 1040   BILITOT 1.3* 08/20/2012 1040   GFRNONAA 86 10/31/2012 0942   GFRAA 99 10/31/2012 0942  Assessment and Plan :  1. Hypertension Better, she is having edema from higher dose of Lotrel. Do not think this is dangerous and just a side effect. Non pitting edema and advised patient fluid pill will not fix this issue.  2. Edema From Lotrel most likely. Follow as needed.  3. Hyperlipidemia.  4. Hyperglycemia Pending results  5. Hypokalemia Will re check level today, may stop medication pending rersults. Dr. Ron Parker starter her on potassium. Hopefully we'll be able to stop this going forward on her present medications.  I have done the exam and reviewed the above chart and it is accurate to the best of my knowledge.  Patient was seen and examined by Dr. Eulas Post and note was scribed by Theressa Millard, RMA.   Miguel Aschoff MD [facility] 02/12/2015 2:00 PM

## 2015-02-13 LAB — HEMOGLOBIN A1C
Est. average glucose Bld gHb Est-mCnc: 123 mg/dL
Hgb A1c MFr Bld: 5.9 % — ABNORMAL HIGH (ref 4.8–5.6)

## 2015-02-13 LAB — RENAL FUNCTION PANEL
Albumin: 4.9 g/dL — ABNORMAL HIGH (ref 3.5–4.7)
BUN/Creatinine Ratio: 12 (ref 11–26)
BUN: 8 mg/dL (ref 8–27)
CALCIUM: 10.2 mg/dL (ref 8.7–10.3)
CO2: 28 mmol/L (ref 18–29)
Chloride: 92 mmol/L — ABNORMAL LOW (ref 97–108)
Creatinine, Ser: 0.69 mg/dL (ref 0.57–1.00)
GFR calc Af Amer: 94 mL/min/{1.73_m2} (ref >59)
GFR calc non Af Amer: 81 mL/min/{1.73_m2} (ref >59)
GLUCOSE: 136 mg/dL — AB (ref 65–99)
POTASSIUM: 4.6 mmol/L (ref 3.5–5.2)
Phosphorus: 3.6 mg/dL (ref 2.5–4.5)
SODIUM: 137 mmol/L (ref 134–144)

## 2015-02-18 ENCOUNTER — Other Ambulatory Visit: Payer: Self-pay

## 2015-02-24 ENCOUNTER — Other Ambulatory Visit: Payer: Self-pay | Admitting: Cardiology

## 2015-02-25 ENCOUNTER — Ambulatory Visit: Payer: Medicare Other | Admitting: Family Medicine

## 2015-03-18 ENCOUNTER — Ambulatory Visit (INDEPENDENT_AMBULATORY_CARE_PROVIDER_SITE_OTHER): Payer: Medicare Other

## 2015-03-18 DIAGNOSIS — I4891 Unspecified atrial fibrillation: Secondary | ICD-10-CM | POA: Diagnosis not present

## 2015-03-18 DIAGNOSIS — Z5181 Encounter for therapeutic drug level monitoring: Secondary | ICD-10-CM

## 2015-03-18 DIAGNOSIS — Z7901 Long term (current) use of anticoagulants: Secondary | ICD-10-CM

## 2015-03-18 LAB — POCT INR: INR: 3

## 2015-04-23 ENCOUNTER — Ambulatory Visit (INDEPENDENT_AMBULATORY_CARE_PROVIDER_SITE_OTHER): Payer: Medicare Other | Admitting: Family Medicine

## 2015-04-23 DIAGNOSIS — Z23 Encounter for immunization: Secondary | ICD-10-CM

## 2015-04-29 ENCOUNTER — Ambulatory Visit (INDEPENDENT_AMBULATORY_CARE_PROVIDER_SITE_OTHER): Payer: Medicare Other

## 2015-04-29 ENCOUNTER — Other Ambulatory Visit: Payer: Self-pay | Admitting: Family Medicine

## 2015-04-29 DIAGNOSIS — Z7901 Long term (current) use of anticoagulants: Secondary | ICD-10-CM

## 2015-04-29 DIAGNOSIS — Z1231 Encounter for screening mammogram for malignant neoplasm of breast: Secondary | ICD-10-CM

## 2015-04-29 DIAGNOSIS — Z5181 Encounter for therapeutic drug level monitoring: Secondary | ICD-10-CM

## 2015-04-29 DIAGNOSIS — I4891 Unspecified atrial fibrillation: Secondary | ICD-10-CM

## 2015-04-29 LAB — POCT INR: INR: 3.4

## 2015-05-01 ENCOUNTER — Ambulatory Visit (INDEPENDENT_AMBULATORY_CARE_PROVIDER_SITE_OTHER): Payer: Medicare Other | Admitting: Cardiovascular Disease

## 2015-05-01 ENCOUNTER — Encounter: Payer: Self-pay | Admitting: Cardiovascular Disease

## 2015-05-01 VITALS — BP 110/62 | HR 81 | Ht 67.0 in | Wt 150.8 lb

## 2015-05-01 DIAGNOSIS — I482 Chronic atrial fibrillation, unspecified: Secondary | ICD-10-CM

## 2015-05-01 DIAGNOSIS — I340 Nonrheumatic mitral (valve) insufficiency: Secondary | ICD-10-CM | POA: Diagnosis not present

## 2015-05-01 DIAGNOSIS — I10 Essential (primary) hypertension: Secondary | ICD-10-CM

## 2015-05-01 DIAGNOSIS — I4891 Unspecified atrial fibrillation: Secondary | ICD-10-CM | POA: Diagnosis not present

## 2015-05-01 MED ORDER — AMLODIPINE BESY-BENAZEPRIL HCL 5-40 MG PO CAPS: 1.0000 | ORAL_CAPSULE | Freq: Every day | ORAL | Status: DC

## 2015-05-01 NOTE — Patient Instructions (Signed)
Medication Instructions:  Your physician has recommended you make the following change in your medication:  STOP taking amlodipine/benazepril 10-40mg  START taking amlodipine/benazepril 5-40mg  once per day    Labwork: none  Testing/Procedures: Your physician has requested that you have an echocardiogram. Echocardiography is a painless test that uses sound waves to create images of your heart. It provides your doctor with information about the size and shape of your heart and how well your heart's chambers and valves are working. This procedure takes approximately one hour. There are no restrictions for this procedure.    Follow-Up: Your physician recommends that you schedule a follow-up appointment in: 4 months with Dr. Fletcher Anon.    Any Other Special Instructions Will Be Listed Below (If Applicable).     If you need a refill on your cardiac medications before your next appointment, please call your pharmacy.  Echocardiogram An echocardiogram, or echocardiography, uses sound waves (ultrasound) to produce an image of your heart. The echocardiogram is simple, painless, obtained within a short period of time, and offers valuable information to your health care provider. The images from an echocardiogram can provide information such as:  Evidence of coronary artery disease (CAD).  Heart size.  Heart muscle function.  Heart valve function.  Aneurysm detection.  Evidence of a past heart attack.  Fluid buildup around the heart.  Heart muscle thickening.  Assess heart valve function. LET [facility] CARE PROVIDER KNOW ABOUT:  Any allergies you have.  All medicines you are taking, including vitamins, herbs, eye drops, creams, and over-the-counter medicines.  Previous problems you or members of your family have had with the use of anesthetics.  Any blood disorders you have.  Previous surgeries you have had.  Medical conditions you have.  Possibility of pregnancy, if this  applies. BEFORE THE PROCEDURE  No special preparation is needed. Eat and drink normally.  PROCEDURE   In order to produce an image of your heart, gel will be applied to your chest and a wand-like tool (transducer) will be moved over your chest. The gel will help transmit the sound waves from the transducer. The sound waves will harmlessly bounce off your heart to allow the heart images to be captured in real-time motion. These images will then be recorded.  You may need an IV to receive a medicine that improves the quality of the pictures. AFTER THE PROCEDURE You may return to your normal schedule including diet, activities, and medicines, unless your health care provider tells you otherwise.   This information is not intended to replace advice given to you by your health care provider. Make sure you discuss any questions you have with your health care provider.   Document Released: 06/17/2000 Document Revised: 07/11/2014 Document Reviewed: 02/25/2013 Elsevier Interactive Patient Education [employer].

## 2015-05-01 NOTE — Assessment & Plan Note (Signed)
Ventricular rate is controlled without any medications. She is tolerating anticoagulation with warfarin. I discussed with her the alternatives to anticoagulation but she prefers to stay on warfarin. Given worsening shortness of breath and leg edema, I requested an echocardiogram.

## 2015-05-01 NOTE — Progress Notes (Signed)
Primary care physician: Dr. Rosanna Randy  HPI  This is an 81 year old female who is here today for follow-up visit. He use to follow-up with Dr. Ron Parker who retired. She has known history of chronic atrial fibrillation on long-term anticoagulation with warfarin, hypertension and hyperlipidemia. She has no history of ischemic heart disease. Her biggest issue seems to be increased shortness of breath and leg edema. She is on high-dose amlodipine. She denies any chest discomfort. No palpitations, dizziness or syncope.  Allergies  Allergen Reactions  . Ciprofloxacin Nausea And Vomiting  . Cortisone     Pt stated this makes her turn blue  . Nitrofurantoin Monohyd Macro     GI upset & chills.  . Other     Mycins     Current Outpatient Prescriptions on File Prior to Visit  Medication Sig Dispense Refill  . atorvastatin (LIPITOR) 10 MG tablet Take 10 mg by mouth daily.     . cholecalciferol (VITAMIN D) 1000 UNITS tablet Take 1,000 Units by mouth daily.    Marland Kitchen COUMADIN 2.5 MG tablet TAKE AS DIRECTED BY COUMADIN CLINIC 35 tablet 3  . hydrochlorothiazide (MICROZIDE) 12.5 MG capsule TAKE 1 CAPSULE (12.5 MG TOTAL) BY MOUTH DAILY. 90 capsule 1  . latanoprost (XALATAN) 0.005 % ophthalmic solution Place into both eyes daily.     . timolol (TIMOPTIC) 0.5 % ophthalmic solution Place into both eyes daily.      No current facility-administered medications on file prior to visit.     Past Medical History  Diagnosis Date  . Shingles   . Dyslipidemia   . Hypertension   . Breast cancer ([place])     1985, implant, no chemotherapy or radiation  . Shortness of breath     January, 9983, normal systolic function, question diastolic dysfunction  . Chest pain   . Ejection fraction     EF 55-65%, January, 2012, question diastolic dysfunction  . Mitral regurgitation     Mild, echo, 2012  . Atrial fibrillation [facility])     New diagnosis, asymptomatic, February, 2014  . Hypokalemia     February, 2014, potassium  started     Past Surgical History  Procedure Laterality Date  . Abdominal hysterectomy      age 27  . Mastectomy      reconstruction inplant as well  . Eye surgery      cataract     Family History  Problem Relation Age of Onset  . Stroke Mother   . Diabetes Father   . Cancer Brother     prostate     Social History   Social History  . Marital Status: Married    Spouse Name: N/A  . Number of Children: N/A  . Years of Education: N/A   Occupational History  . Not on file.   Social History Main Topics  . Smoking status: Never Smoker   . Smokeless tobacco: Never Used  . Alcohol Use: No     Comment: red wine occasionally  . Drug Use: No  . Sexual Activity: No   Other Topics Concern  . Not on file   Social History Narrative      PHYSICAL EXAM   BP 110/62 mmHg  Pulse 81  Ht 5\' 7"  (1.702 m)  Wt 150 lb 12 oz (68.38 kg)  BMI 23.61 kg/m2 Constitutional: She is oriented to person, place, and time. She appears well-developed and well-nourished. No distress.  HENT: No nasal discharge.  Head: Normocephalic and atraumatic.  Eyes: Pupils  are equal and round. No discharge.  Neck: Normal range of motion. Neck supple. No JVD present. No thyromegaly present.  Cardiovascular: Normal rate, irregular rhythm, normal heart sounds. Exam reveals no gallop and no friction rub. No murmur heard.  Pulmonary/Chest: Effort normal and breath sounds normal. No stridor. No respiratory distress. She has no wheezes. She has no rales. She exhibits no tenderness.  Abdominal: Soft. Bowel sounds are normal. She exhibits no distension. There is no tenderness. There is no rebound and no guarding.  Musculoskeletal: Normal range of motion. She exhibits +2 edema and no tenderness.  Neurological: She is alert and oriented to person, place, and time. Coordination normal.  Skin: Skin is warm and dry. No rash noted. She is not diaphoretic. No erythema. No pallor.  Psychiatric: She has a normal mood  and affect. Her behavior is normal. Judgment and thought content normal.     EKG: Atrial fibrillation with PVCs, T-wave changes suggestive of inferolateral ischemia.   ASSESSMENT AND PLAN

## 2015-05-01 NOTE — Assessment & Plan Note (Signed)
Blood pressure is controlled. However, she has significant leg edema which I suspect is likely due to high-dose amlodipine with an element of chronic venous insufficiency. Thus, I decreased the dose of amlodipine-benazepril to 5-40 mg once daily. I advised her to elevate her legs frequently during the day and consider using knee-high support stockings.

## 2015-05-08 ENCOUNTER — Ambulatory Visit
Admission: RE | Admit: 2015-05-08 | Discharge: 2015-05-08 | Disposition: A | Discharge status: Home / Self Care | Payer: Medicare Other | Source: Ambulatory Visit | Attending: Family Medicine | Admitting: Family Medicine

## 2015-05-08 ENCOUNTER — Other Ambulatory Visit: Payer: Self-pay | Admitting: Family Medicine

## 2015-05-08 DIAGNOSIS — Z1231 Encounter for screening mammogram for malignant neoplasm of breast: Secondary | ICD-10-CM

## 2015-05-14 ENCOUNTER — Telehealth: Payer: Self-pay | Admitting: Family Medicine

## 2015-05-14 DIAGNOSIS — R928 Other abnormal and inconclusive findings on diagnostic imaging of breast: Secondary | ICD-10-CM

## 2015-05-14 NOTE — Telephone Encounter (Signed)
Spoke with patient, ordered additional mammogram on left breast and [place] if needed, spoke to [place] and appt is been made with patient to get this done-aa

## 2015-05-14 NOTE — Telephone Encounter (Signed)
Pt states she was advised to have more imaging's done for a mammogram pt states she was told that her Dr had to send a order to have more done pt would like to know after the Dr sends order  so she can call to make her own appointment to have it done. CB# [phone] CC

## 2015-05-18 NOTE — Telephone Encounter (Signed)
Patient was calling about mammogram appt. Latasha Anderson reports that an order had not been placed. Ordered mammogram and [place] and advised patient that [facility] will contact her with appt date and time.

## 2015-05-20 ENCOUNTER — Ambulatory Visit
Admission: RE | Admit: 2015-05-20 | Discharge: 2015-05-20 | Disposition: A | Discharge status: Home / Self Care | Payer: Medicare Other | Source: Ambulatory Visit | Attending: Family Medicine | Admitting: Family Medicine

## 2015-05-20 ENCOUNTER — Ambulatory Visit (INDEPENDENT_AMBULATORY_CARE_PROVIDER_SITE_OTHER): Payer: Medicare Other

## 2015-05-20 ENCOUNTER — Other Ambulatory Visit: Payer: Self-pay | Admitting: Family Medicine

## 2015-05-20 DIAGNOSIS — Z5181 Encounter for therapeutic drug level monitoring: Secondary | ICD-10-CM | POA: Diagnosis not present

## 2015-05-20 DIAGNOSIS — N63 Unspecified lump in breast: Secondary | ICD-10-CM | POA: Diagnosis not present

## 2015-05-20 DIAGNOSIS — R928 Other abnormal and inconclusive findings on diagnostic imaging of breast: Secondary | ICD-10-CM

## 2015-05-20 DIAGNOSIS — I4891 Unspecified atrial fibrillation: Secondary | ICD-10-CM

## 2015-05-20 DIAGNOSIS — Z7901 Long term (current) use of anticoagulants: Secondary | ICD-10-CM

## 2015-05-20 LAB — POCT INR: INR: 2.5

## 2015-06-04 ENCOUNTER — Ambulatory Visit (INDEPENDENT_AMBULATORY_CARE_PROVIDER_SITE_OTHER): Payer: Medicare Other

## 2015-06-04 ENCOUNTER — Other Ambulatory Visit: Payer: Self-pay

## 2015-06-04 DIAGNOSIS — I4891 Unspecified atrial fibrillation: Secondary | ICD-10-CM | POA: Diagnosis not present

## 2015-06-10 ENCOUNTER — Other Ambulatory Visit: Payer: Self-pay | Admitting: Cardiology

## 2015-06-11 ENCOUNTER — Other Ambulatory Visit: Payer: Self-pay

## 2015-06-11 DIAGNOSIS — I159 Secondary hypertension, unspecified: Secondary | ICD-10-CM

## 2015-06-11 MED ORDER — FUROSEMIDE 20 MG PO TABS: 20.0000 mg | ORAL_TABLET | Freq: Every day | ORAL | Status: DC

## 2015-06-17 ENCOUNTER — Other Ambulatory Visit (INDEPENDENT_AMBULATORY_CARE_PROVIDER_SITE_OTHER): Payer: Medicare Other | Admitting: *Deleted

## 2015-06-17 ENCOUNTER — Ambulatory Visit (INDEPENDENT_AMBULATORY_CARE_PROVIDER_SITE_OTHER): Payer: Medicare Other

## 2015-06-17 DIAGNOSIS — I159 Secondary hypertension, unspecified: Secondary | ICD-10-CM

## 2015-06-17 DIAGNOSIS — Z5181 Encounter for therapeutic drug level monitoring: Secondary | ICD-10-CM | POA: Diagnosis not present

## 2015-06-17 DIAGNOSIS — I10 Essential (primary) hypertension: Secondary | ICD-10-CM

## 2015-06-17 DIAGNOSIS — Z7901 Long term (current) use of anticoagulants: Secondary | ICD-10-CM | POA: Diagnosis not present

## 2015-06-17 DIAGNOSIS — I4891 Unspecified atrial fibrillation: Secondary | ICD-10-CM | POA: Diagnosis not present

## 2015-06-17 LAB — POCT INR: INR: 2

## 2015-06-18 LAB — BASIC METABOLIC PANEL
BUN / CREAT RATIO: 11 (ref 11–26)
BUN: 7 mg/dL — AB (ref 8–27)
CO2: 26 mmol/L (ref 18–29)
CREATININE: 0.64 mg/dL (ref 0.57–1.00)
Calcium: 9.6 mg/dL (ref 8.7–10.3)
Chloride: 95 mmol/L — ABNORMAL LOW (ref 96–106)
GFR, EST AFRICAN AMERICAN: 96 mL/min/{1.73_m2} (ref >59)
GFR, EST NON AFRICAN AMERICAN: 83 mL/min/{1.73_m2} (ref >59)
Glucose: 109 mg/dL — ABNORMAL HIGH (ref 65–99)
Potassium: 4.4 mmol/L (ref 3.5–5.2)
SODIUM: 138 mmol/L (ref 134–144)

## 2015-07-15 ENCOUNTER — Ambulatory Visit (INDEPENDENT_AMBULATORY_CARE_PROVIDER_SITE_OTHER): Payer: Medicare Other

## 2015-07-15 DIAGNOSIS — Z5181 Encounter for therapeutic drug level monitoring: Secondary | ICD-10-CM

## 2015-07-15 DIAGNOSIS — Z7901 Long term (current) use of anticoagulants: Secondary | ICD-10-CM

## 2015-07-15 DIAGNOSIS — I4891 Unspecified atrial fibrillation: Secondary | ICD-10-CM

## 2015-07-15 LAB — POCT INR: INR: 2.6

## 2015-08-10 ENCOUNTER — Other Ambulatory Visit: Payer: Self-pay | Admitting: Cardiology

## 2015-08-20 ENCOUNTER — Encounter: Payer: Self-pay | Admitting: Family Medicine

## 2015-08-20 ENCOUNTER — Ambulatory Visit (INDEPENDENT_AMBULATORY_CARE_PROVIDER_SITE_OTHER): Payer: Medicare Other | Admitting: Family Medicine

## 2015-08-20 ENCOUNTER — Other Ambulatory Visit: Payer: Self-pay | Admitting: Family Medicine

## 2015-08-20 VITALS — BP 128/78 | HR 72 | Temp 97.8°F | Resp 16 | Wt 147.0 lb

## 2015-08-20 DIAGNOSIS — I10 Essential (primary) hypertension: Secondary | ICD-10-CM

## 2015-08-20 DIAGNOSIS — E785 Hyperlipidemia, unspecified: Secondary | ICD-10-CM | POA: Diagnosis not present

## 2015-08-20 DIAGNOSIS — I4891 Unspecified atrial fibrillation: Secondary | ICD-10-CM | POA: Diagnosis not present

## 2015-08-20 NOTE — Progress Notes (Signed)
Patient ID: Latasha Anderson, female   DOB: [birth year], 82 y.o.   MRN: [id number]       Patient: Latasha Anderson Female    DOB: 29-Apr-1931   82 y.o.   MRN: [id number] Visit Date: 08/20/2015  Today's Provider: Wilhemena Durie, MD   Chief Complaint  Patient presents with  . Hypertension    6 month F/U.    Subjective:    Hypertension This is a chronic problem. The problem is controlled. Pertinent negatives include no chest pain, headaches, neck pain, palpitations, peripheral edema or shortness of breath. Past treatments include diuretics. The current treatment provides moderate improvement. There are no compliance problems.   On last OV patient was started on Lasix 20mg  daily. She reports that she feels much better since starting medication. She states that she checks her BP daily and it runs anywhere in the 120-130s/60-70s.      Allergies  Allergen Reactions  . Ciprofloxacin Nausea And Vomiting  . Cortisone     Pt stated this makes her turn blue  . Nitrofurantoin Monohyd Macro     GI upset & chills.  . Other     Mycins   Previous Medications   ACETAMINOPHEN (TYLENOL PO)    Take by mouth as needed.   AMLODIPINE-BENAZEPRIL (LOTREL) 5-40 MG CAPSULE    Take 1 capsule by mouth daily.   ATORVASTATIN (LIPITOR) 10 MG TABLET    TAKE 1 TABLET BY MOUTH EVERY DAY   CHOLECALCIFEROL (VITAMIN D) 1000 UNITS TABLET    Take 1,000 Units by mouth daily.   COUMADIN 2.5 MG TABLET    TAKE AS DIRECTED BY COUMADIN CLINIC   FUROSEMIDE (LASIX) 20 MG TABLET    Take 1 tablet (20 mg total) by mouth daily.   LATANOPROST (XALATAN) 0.005 % OPHTHALMIC SOLUTION    Place into both eyes daily.    TIMOLOL (TIMOPTIC) 0.5 % OPHTHALMIC SOLUTION    Place into both eyes daily.     Review of Systems  Constitutional: Negative.   Respiratory: Negative.  Negative for shortness of breath.   Cardiovascular: Negative.  Negative for chest pain and palpitations.  Musculoskeletal: Negative for neck pain.    Neurological: Negative for headaches.    Social History  Substance Use Topics  . Smoking status: Never Smoker   . Smokeless tobacco: Never Used  . Alcohol Use: No     Comment: red wine occasionally   Objective:   BP 128/78 mmHg  Pulse 72  Temp(Src) 97.8 F (36.6 C)  Resp 16  Wt 147 lb (66.679 kg)  Physical Exam  Constitutional: She is oriented to person, place, and time. She appears well-developed and well-nourished.  HENT:  Head: Normocephalic and atraumatic.  Right Ear: External ear normal.  Left Ear: External ear normal.  Nose: Nose normal.  Neck: Normal range of motion. Neck supple.  Cardiovascular: Normal rate, regular rhythm, normal heart sounds and intact distal pulses.   Pulmonary/Chest: Effort normal and breath sounds normal.  Abdominal: Soft.  Neurological: She is alert and oriented to person, place, and time. She has normal reflexes.  Skin: Skin is warm and dry.  Psychiatric: She has a normal mood and affect. Her behavior is normal. Judgment and thought content normal.        Assessment & Plan:     1. Atrial fibrillation, unspecified type ([place]) - Lipid panel - CBC with Differential/Platelet - Comprehensive metabolic panel - TSH  2. Essential (primary) hypertension Stable. F/U pending labs.  -  Lipid panel - CBC with Differential/Platelet - Comprehensive metabolic panel - TSH  3. HLD (hyperlipidemia) - Lipid panel - CBC with Differential/Platelet - Comprehensive metabolic panel - TSH      I have done the exam and reviewed the above chart and it is accurate to the best of my knowledge.   Richard Cranford Mon, MD  [facility]

## 2015-08-25 LAB — COMPREHENSIVE METABOLIC PANEL
A/G RATIO: 1.9 (ref 1.1–2.5)
ALT: 13 IU/L (ref 0–32)
AST: 20 IU/L (ref 0–40)
Albumin: 4.1 g/dL (ref 3.5–4.7)
Alkaline Phosphatase: 54 IU/L (ref 39–117)
BUN/Creatinine Ratio: 12 (ref 11–26)
BUN: 7 mg/dL — ABNORMAL LOW (ref 8–27)
Bilirubin Total: 1.2 mg/dL (ref 0.0–1.2)
CALCIUM: 9.2 mg/dL (ref 8.7–10.3)
CO2: 26 mmol/L (ref 18–29)
Chloride: 99 mmol/L (ref 96–106)
Creatinine, Ser: 0.58 mg/dL (ref 0.57–1.00)
GFR, EST AFRICAN AMERICAN: 99 mL/min/{1.73_m2} (ref >59)
GFR, EST NON AFRICAN AMERICAN: 86 mL/min/{1.73_m2} (ref >59)
GLOBULIN, TOTAL: 2.2 g/dL (ref 1.5–4.5)
Glucose: 94 mg/dL (ref 65–99)
POTASSIUM: 4 mmol/L (ref 3.5–5.2)
SODIUM: 145 mmol/L — AB (ref 134–144)
TOTAL PROTEIN: 6.3 g/dL (ref 6.0–8.5)

## 2015-08-25 LAB — LIPID PANEL
CHOLESTEROL TOTAL: 168 mg/dL (ref 100–199)
Chol/HDL Ratio: 3.4 ratio units (ref 0.0–4.4)
HDL: 49 mg/dL (ref >39)
LDL Calculated: 89 mg/dL (ref 0–99)
Triglycerides: 152 mg/dL — ABNORMAL HIGH (ref 0–149)
VLDL CHOLESTEROL CAL: 30 mg/dL (ref 5–40)

## 2015-08-25 LAB — CBC WITH DIFFERENTIAL/PLATELET
BASOS: 0 %
Basophils Absolute: 0 10*3/uL (ref 0.0–0.2)
EOS (ABSOLUTE): 0.2 10*3/uL (ref 0.0–0.4)
EOS: 3 %
HEMATOCRIT: 43.4 % (ref 34.0–46.6)
Hemoglobin: 14.8 g/dL (ref 11.1–15.9)
IMMATURE GRANS (ABS): 0 10*3/uL (ref 0.0–0.1)
IMMATURE GRANULOCYTES: 0 %
Lymphocytes Absolute: 1.9 10*3/uL (ref 0.7–3.1)
Lymphs: 27 %
MCH: 31.8 pg (ref 26.6–33.0)
MCHC: 34.1 g/dL (ref 31.5–35.7)
MCV: 93 fL (ref 79–97)
MONOS ABS: 0.5 10*3/uL (ref 0.1–0.9)
Monocytes: 7 %
NEUTROS PCT: 63 %
Neutrophils Absolute: 4.5 10*3/uL (ref 1.4–7.0)
Platelets: 259 10*3/uL (ref 150–379)
RBC: 4.65 x10E6/uL (ref 3.77–5.28)
RDW: 13.2 % (ref 12.3–15.4)
WBC: 7.1 10*3/uL (ref 3.4–10.8)

## 2015-08-25 LAB — TSH: TSH: 3.6 u[IU]/mL (ref 0.450–4.500)

## 2015-08-26 ENCOUNTER — Ambulatory Visit (INDEPENDENT_AMBULATORY_CARE_PROVIDER_SITE_OTHER): Payer: Medicare Other | Admitting: *Deleted

## 2015-08-26 DIAGNOSIS — Z5181 Encounter for therapeutic drug level monitoring: Secondary | ICD-10-CM | POA: Diagnosis not present

## 2015-08-26 DIAGNOSIS — Z7901 Long term (current) use of anticoagulants: Secondary | ICD-10-CM

## 2015-08-26 DIAGNOSIS — I4891 Unspecified atrial fibrillation: Secondary | ICD-10-CM

## 2015-08-26 LAB — POCT INR: INR: 2.2

## 2015-08-31 ENCOUNTER — Telehealth: Payer: Self-pay | Admitting: Family Medicine

## 2015-08-31 NOTE — Telephone Encounter (Signed)
Labs WNL.

## 2015-08-31 NOTE — Telephone Encounter (Signed)
Pt is requesting lab results.  [id number]

## 2015-08-31 NOTE — Telephone Encounter (Signed)
Lab results from 08/24/15.

## 2015-08-31 NOTE — Telephone Encounter (Signed)
Pt advised-aa 

## 2015-09-03 ENCOUNTER — Ambulatory Visit (INDEPENDENT_AMBULATORY_CARE_PROVIDER_SITE_OTHER): Payer: Medicare Other | Admitting: Cardiovascular Disease

## 2015-09-03 ENCOUNTER — Encounter: Payer: Self-pay | Admitting: Cardiovascular Disease

## 2015-09-03 VITALS — BP 130/90 | HR 79 | Ht 66.0 in | Wt 144.5 lb

## 2015-09-03 DIAGNOSIS — I482 Chronic atrial fibrillation, unspecified: Secondary | ICD-10-CM

## 2015-09-03 NOTE — Progress Notes (Signed)
Cardiology Office Note   Date:  09/03/2015   ID:  Litonya, Droege Sep 29, 1930, MRN [id number]  PCP:  Wilhemena Durie, MD  Cardiologist:   Kathlyn Sacramento, MD   Chief Complaint  Patient presents with  . other    4 month f/u no complaints. Meds reviewed verbally with pt.      History of Present Illness: Latasha Anderson is a 82 y.o. female who presents for a follow-up visit regarding chronic atrial fibrillation and chronic diastolic heart failure.  She has known history of chronic atrial fibrillation on long-term anticoagulation with warfarin, hypertension and hyperlipidemia. She has no history of ischemic heart disease.  During last visit, she complained of increased exertional dyspnea and leg edema. I decreased the dose of amlodipine to 5 mg once daily and requested an echocardiogram which showed normal LV systolic function, mild mitral regurgitation and moderate pulmonary hypertension with an estimated peak systolic pressure of 51 mmHg with a dilated IVC. Thus, I switched hydrochlorothiazide to small dose furosemide 20 mg once daily. Since then, she reports significant improvement in dyspnea and leg edema. She also reports improvement in dizziness.    Past Medical History  Diagnosis Date  . Shingles   . Dyslipidemia   . Hypertension   . Shortness of breath     January, 0000000, normal systolic function, question diastolic dysfunction  . Chest pain   . Ejection fraction     EF 55-65%, January, 2012, question diastolic dysfunction  . Mitral regurgitation     Mild, echo, 2012  . Atrial fibrillation [facility])     New diagnosis, asymptomatic, February, 2014  . Hypokalemia     February, 2014, potassium started  . Breast cancer ([place]) [place], implant, no chemotherapy or radiation    Past Surgical History  Procedure Laterality Date  . Abdominal hysterectomy      age 81  . Eye surgery      cataract  . Mastectomy Right 1985    reconstruction inplant as well  .  Augmentation mammaplasty Right 1985    RT MASTECTOMY FOR BREAST CA     Current Outpatient Prescriptions  Medication Sig Dispense Refill  . Acetaminophen (TYLENOL PO) Take by mouth as needed.    Marland Kitchen amLODipine-benazepril (LOTREL) 5-40 MG capsule Take 1 capsule by mouth daily. 30 capsule 5  . atorvastatin (LIPITOR) 10 MG tablet TAKE 1 TABLET BY MOUTH EVERY DAY 90 tablet 2  . cholecalciferol (VITAMIN D) 1000 UNITS tablet Take 1,000 Units by mouth daily.    Marland Kitchen COUMADIN 2.5 MG tablet TAKE AS DIRECTED BY COUMADIN CLINIC 35 tablet 3  . furosemide (LASIX) 20 MG tablet Take 1 tablet (20 mg total) by mouth daily. 30 tablet 11  . latanoprost (XALATAN) 0.005 % ophthalmic solution Place into both eyes daily.     . timolol (TIMOPTIC) 0.5 % ophthalmic solution Place into both eyes daily.      No current facility-administered medications for this visit.    Allergies:   Ciprofloxacin; Cortisone; Nitrofurantoin monohyd macro; and Other    Social History:  The patient  reports that she has never smoked. She has never used smokeless tobacco. She reports that she does not drink alcohol or use illicit drugs.   Family History:  The patient's family history includes Cancer in her brother; Diabetes in her father; Stroke in her mother. There is no history of Breast cancer.      PHYSICAL EXAM: VS:  BP 130/90  mmHg  Pulse 79  Ht 5\' 6"  (1.676 m)  Wt 144 lb 8 oz (65.545 kg)  BMI 23.33 kg/m2 , BMI Body mass index is 23.33 kg/(m^2). GEN: Well nourished, well developed, in no acute distress HEENT: normal Neck: no JVD, carotid bruits, or masses Cardiac: Irregularly irregular; no murmurs, rubs, or gallops,no edema  Respiratory:  clear to auscultation bilaterally, normal work of breathing GI: soft, nontender, nondistended, + BS MS: no deformity or atrophy Skin: warm and dry, no rash Neuro:  Strength and sensation are intact Psych: euthymic mood, full affect   EKG:  EKG is ordered today. The ekg ordered today  demonstrates : Atrial fibrillation with a PVC and lateral T wave changes suggestive of ischemia.   Recent Labs: 08/24/2015: ALT 13; BUN 7*; Creatinine, Ser 0.58; Platelets 259; Potassium 4.0; Sodium 145*; TSH 3.600    Lipid Panel    Component Value Date/Time   CHOL 168 08/24/2015 0912   TRIG 152* 08/24/2015 0912   HDL 49 08/24/2015 0912   CHOLHDL 3.4 08/24/2015 0912   LDLCALC 89 08/24/2015 0912      Wt Readings from Last 3 Encounters:  09/03/15 144 lb 8 oz (65.545 kg)  08/20/15 147 lb (66.679 kg)  05/01/15 150 lb 12 oz (68.38 kg)         ASSESSMENT AND PLAN:  1.  Chronic atrial fibrillation: Ventricular rate is well controlled and she is tolerating anticoagulation with warfarin with no complications. Continue same treatment.  2.  Chronic diastolic heart failure: I suspect that her recent worsening of dyspnea and leg edema was due to diastolic heart failure which improved significantly after the addition of small dose furosemide. Her weight went down 5 pounds since her last visit. Continue same treatment.  3.  Essential hypertension: Diastolic blood pressure is mildly elevated today. Continue to monitor for now.      Disposition:   FU with me  6 months  Signed, Kathlyn Sacramento, MD  09/03/2015 9:27 AM    [facility]

## 2015-09-03 NOTE — Patient Instructions (Signed)
Medication Instructions: Continue same medications.   Labwork: None.   Procedures/Testing: None.   Follow-Up: 6 months with Dr. Waunetta Riggle.   Any Additional Special Instructions Will Be Listed Below (If Applicable).     If you need a refill on your cardiac medications before your next appointment, please call your pharmacy.   

## 2015-10-07 ENCOUNTER — Ambulatory Visit (INDEPENDENT_AMBULATORY_CARE_PROVIDER_SITE_OTHER): Payer: Medicare Other

## 2015-10-07 DIAGNOSIS — Z5181 Encounter for therapeutic drug level monitoring: Secondary | ICD-10-CM | POA: Diagnosis not present

## 2015-10-07 DIAGNOSIS — Z7901 Long term (current) use of anticoagulants: Secondary | ICD-10-CM | POA: Diagnosis not present

## 2015-10-07 DIAGNOSIS — I4891 Unspecified atrial fibrillation: Secondary | ICD-10-CM | POA: Diagnosis not present

## 2015-10-07 LAB — POCT INR: INR: 2.2

## 2015-10-15 ENCOUNTER — Other Ambulatory Visit: Payer: Self-pay | Admitting: *Deleted

## 2015-10-15 MED ORDER — COUMADIN 2.5 MG PO TABS: ORAL_TABLET | ORAL | Status: DC

## 2015-11-12 ENCOUNTER — Other Ambulatory Visit: Payer: Self-pay | Admitting: Cardiovascular Disease

## 2015-11-18 ENCOUNTER — Ambulatory Visit (INDEPENDENT_AMBULATORY_CARE_PROVIDER_SITE_OTHER): Payer: Medicare Other

## 2015-11-18 DIAGNOSIS — Z5181 Encounter for therapeutic drug level monitoring: Secondary | ICD-10-CM | POA: Diagnosis not present

## 2015-11-18 DIAGNOSIS — I4891 Unspecified atrial fibrillation: Secondary | ICD-10-CM

## 2015-11-18 DIAGNOSIS — Z7901 Long term (current) use of anticoagulants: Secondary | ICD-10-CM | POA: Diagnosis not present

## 2015-11-18 LAB — POCT INR: INR: 2.3

## 2015-12-30 ENCOUNTER — Ambulatory Visit (INDEPENDENT_AMBULATORY_CARE_PROVIDER_SITE_OTHER): Payer: Medicare Other | Admitting: *Deleted

## 2015-12-30 DIAGNOSIS — Z5181 Encounter for therapeutic drug level monitoring: Secondary | ICD-10-CM

## 2015-12-30 DIAGNOSIS — I4891 Unspecified atrial fibrillation: Secondary | ICD-10-CM

## 2015-12-30 DIAGNOSIS — Z7901 Long term (current) use of anticoagulants: Secondary | ICD-10-CM

## 2015-12-30 LAB — POCT INR: INR: 2.5

## 2016-02-10 ENCOUNTER — Ambulatory Visit (INDEPENDENT_AMBULATORY_CARE_PROVIDER_SITE_OTHER): Payer: Medicare Other

## 2016-02-10 DIAGNOSIS — Z5181 Encounter for therapeutic drug level monitoring: Secondary | ICD-10-CM | POA: Diagnosis not present

## 2016-02-10 DIAGNOSIS — I4891 Unspecified atrial fibrillation: Secondary | ICD-10-CM | POA: Diagnosis not present

## 2016-02-10 DIAGNOSIS — Z7901 Long term (current) use of anticoagulants: Secondary | ICD-10-CM

## 2016-02-10 LAB — POCT INR: INR: 2.6

## 2016-02-12 ENCOUNTER — Ambulatory Visit (INDEPENDENT_AMBULATORY_CARE_PROVIDER_SITE_OTHER): Payer: Medicare Other | Admitting: Cardiovascular Disease

## 2016-02-12 ENCOUNTER — Encounter: Payer: Self-pay | Admitting: Cardiovascular Disease

## 2016-02-12 VITALS — BP 110/68 | HR 75 | Ht 66.0 in | Wt 144.2 lb

## 2016-02-12 DIAGNOSIS — I4891 Unspecified atrial fibrillation: Secondary | ICD-10-CM

## 2016-02-12 DIAGNOSIS — I482 Chronic atrial fibrillation, unspecified: Secondary | ICD-10-CM

## 2016-02-12 DIAGNOSIS — I5032 Chronic diastolic (congestive) heart failure: Secondary | ICD-10-CM | POA: Diagnosis not present

## 2016-02-12 DIAGNOSIS — I10 Essential (primary) hypertension: Secondary | ICD-10-CM | POA: Diagnosis not present

## 2016-02-12 NOTE — Patient Instructions (Signed)
Medication Instructions: Continue same medications.   Labwork: None.   Procedures/Testing: None.   Follow-Up: 6 months with Dr. Arida.   Any Additional Special Instructions Will Be Listed Below (If Applicable).     If you need a refill on your cardiac medications before your next appointment, please call your pharmacy.   

## 2016-02-12 NOTE — Progress Notes (Signed)
Cardiology Office Note   Date:  02/12/2016   ID:  Latasha, Anderson October 01, 1930, MRN [id number]  PCP:  Wilhemena Durie, MD  Cardiologist:   Kathlyn Sacramento, MD   Chief Complaint  Patient presents with  . Other    6 month follow up. Meds reviewed by the patient verbally. Pt. c/o shortness of breath.       History of Present Illness: Latasha Anderson is a 82 y.o. female who presents for a follow-up visit regarding chronic atrial fibrillation and chronic diastolic heart failure.  She has known history of chronic atrial fibrillation on long-term anticoagulation with warfarin, hypertension and hyperlipidemia. She has no history of ischemic heart disease.  Echocardiogram in 06/2015 showed normal LV systolic function, mild mitral regurgitation and moderate pulmonary hypertension with an estimated peak systolic pressure of 51 mmHg with a dilated IVC.  Volume overload improved significantly with small dose furosemide. She is also suspected of having chronic venous insufficiency and has been using knee-high support stockings. She is doing very well and denies any chest pain. She has mild exertional dyspnea with no recent worsening. No dizziness.   Past Medical History:  Diagnosis Date  . Atrial fibrillation [facility])    New diagnosis, asymptomatic, February, 2014  . Breast cancer ([place]) [place], implant, no chemotherapy or radiation  . Chest pain   . Dyslipidemia   . Ejection fraction    EF 55-65%, January, 2012, question diastolic dysfunction  . Hypertension   . Hypokalemia    February, 2014, potassium started  . Mitral regurgitation    Mild, echo, 2012  . Shingles   . Shortness of breath    January, 0000000, normal systolic function, question diastolic dysfunction    Past Surgical History:  Procedure Laterality Date  . ABDOMINAL HYSTERECTOMY     age 29  . AUGMENTATION MAMMAPLASTY Right 1985   RT MASTECTOMY FOR BREAST CA  . EYE SURGERY     cataract  . MASTECTOMY  Right 1985   reconstruction inplant as well     Current Outpatient Prescriptions  Medication Sig Dispense Refill  . Acetaminophen (TYLENOL PO) Take by mouth as needed.    Marland Kitchen amLODipine-benazepril (LOTREL) 5-40 MG capsule TAKE 1 CAPSULE BY MOUTH DAILY. 30 capsule 3  . atorvastatin (LIPITOR) 10 MG tablet TAKE 1 TABLET BY MOUTH EVERY DAY 90 tablet 2  . cholecalciferol (VITAMIN D) 1000 UNITS tablet Take 1,000 Units by mouth daily.    Marland Kitchen COUMADIN 2.5 MG tablet TAKE AS DIRECTED BY COUMADIN CLINIC 35 tablet 3  . furosemide (LASIX) 20 MG tablet Take 1 tablet (20 mg total) by mouth daily. 30 tablet 11  . latanoprost (XALATAN) 0.005 % ophthalmic solution Place into both eyes daily.     . timolol (TIMOPTIC) 0.5 % ophthalmic solution Place into both eyes daily.      No current facility-administered medications for this visit.     Allergies:   Ciprofloxacin; Cortisone; Nitrofurantoin monohyd macro; and Other    Social History:  The patient  reports that she has never smoked. She has never used smokeless tobacco. She reports that she does not drink alcohol or use drugs.   Family History:  The patient's family history includes Cancer in her brother; Diabetes in her father; Stroke in her mother.      PHYSICAL EXAM: VS:  BP 110/68 (BP Location: Left Arm, Patient Position: Sitting, Cuff Size: Normal)   Pulse 75   Ht 5\' 6"  (1.676  m)   Wt 144 lb 4 oz (65.4 kg)   BMI 23.28 kg/m  , BMI Body mass index is 23.28 kg/m. GEN: Well nourished, well developed, in no acute distress  HEENT: normal  Neck: no JVD, carotid bruits, or masses Cardiac: Irregularly irregular; no murmurs, rubs, or gallops,no edema  Respiratory:  clear to auscultation bilaterally, normal work of breathing GI: soft, nontender, nondistended, + BS MS: no deformity or atrophy  Skin: warm and dry, no rash Neuro:  Strength and sensation are intact Psych: euthymic mood, full affect   EKG:  EKG is ordered today. The ekg ordered today  demonstrates : Atrial fibrillation with a PVC and lateral T wave changes suggestive of ischemia.   Recent Labs: 08/24/2015: ALT 13; BUN 7; Creatinine, Ser 0.58; Platelets 259; Potassium 4.0; Sodium 145; TSH 3.600    Lipid Panel    Component Value Date/Time   CHOL 168 08/24/2015 0912   TRIG 152 (H) 08/24/2015 0912   HDL 49 08/24/2015 0912   CHOLHDL 3.4 08/24/2015 0912   LDLCALC 89 08/24/2015 0912      Wt Readings from Last 3 Encounters:  02/12/16 144 lb 4 oz (65.4 kg)  09/03/15 144 lb 8 oz (65.5 kg)  08/20/15 147 lb (66.7 kg)         ASSESSMENT AND PLAN:  1.  Chronic atrial fibrillation: Ventricular rate is well controlled and she is tolerating anticoagulation with warfarin with no complications. Continue same treatment.  2.  Chronic diastolic heart failure: Symptoms improved significantly with small dose furosemide. She currently appears to be euvolemic.  3.  Essential hypertension: Blood pressure is well controlled on current medications.    Disposition:   FU with me  6 months  Signed, Kathlyn Sacramento, MD  02/12/2016 11:37 AM    [place]

## 2016-02-18 ENCOUNTER — Other Ambulatory Visit: Payer: Self-pay | Admitting: Cardiovascular Disease

## 2016-02-18 ENCOUNTER — Ambulatory Visit (INDEPENDENT_AMBULATORY_CARE_PROVIDER_SITE_OTHER): Payer: Medicare Other | Admitting: Family Medicine

## 2016-02-18 ENCOUNTER — Encounter: Payer: Self-pay | Admitting: Family Medicine

## 2016-02-18 VITALS — BP 128/72 | HR 80 | Temp 97.9°F | Resp 16 | Ht 66.0 in | Wt 145.0 lb

## 2016-02-18 DIAGNOSIS — I10 Essential (primary) hypertension: Secondary | ICD-10-CM | POA: Diagnosis not present

## 2016-02-18 DIAGNOSIS — E785 Hyperlipidemia, unspecified: Secondary | ICD-10-CM

## 2016-02-18 NOTE — Telephone Encounter (Signed)
Please review for refill. Thanks!  

## 2016-02-18 NOTE — Progress Notes (Signed)
Patient: Latasha Anderson Female    DOB: 1931-06-18   82 y.o.   MRN: [id number] Visit Date: 02/18/2016  Today's Provider: Wilhemena Durie, MD   Chief Complaint  Patient presents with  . Hypertension    6 month follow up   Subjective:    HPI Patient comes in today for a 6 month follow up on hypertension. Patient reports that she does check her BP at home and its usually in the 120s/80s. Patient denies any shortness of breath, chest pain, or headaches. Patient currently takes Lotrel 5-40mg  daily for BP.   Patient also comes in today to follow up on high cholesterol. Patient currently takes atorvastatin 10mg  daily with no side effects.  Patient also reports that she was seen by cardiology last week, and everything was stable.     Allergies  Allergen Reactions  . Ciprofloxacin Nausea And Vomiting  . Cortisone     Pt stated this makes her turn blue  . Nitrofurantoin Monohyd Macro     GI upset & chills.  . Other     Mycins   Current Meds  Medication Sig  . Acetaminophen (TYLENOL PO) Take by mouth as needed.  Marland Kitchen amLODipine-benazepril (LOTREL) 5-40 MG capsule TAKE 1 CAPSULE BY MOUTH DAILY.  Marland Kitchen atorvastatin (LIPITOR) 10 MG tablet TAKE 1 TABLET BY MOUTH EVERY DAY  . cholecalciferol (VITAMIN D) 1000 UNITS tablet Take 1,000 Units by mouth daily.  Marland Kitchen COUMADIN 2.5 MG tablet TAKE AS DIRECTED BY COUMADIN CLINIC  . furosemide (LASIX) 20 MG tablet Take 1 tablet (20 mg total) by mouth daily.  Marland Kitchen latanoprost (XALATAN) 0.005 % ophthalmic solution Place into both eyes daily.   . timolol (TIMOPTIC) 0.5 % ophthalmic solution Place into both eyes daily.     Review of Systems  Constitutional: Negative.   Respiratory: Negative.   Cardiovascular: Negative.   Endocrine: Negative.   Musculoskeletal: Negative.   Skin: Negative.   Allergic/Immunologic: Negative.   Neurological: Negative.     Social History  Substance Use Topics  . Smoking status: Never Smoker  . Smokeless tobacco:  Never Used  . Alcohol use No     Comment: red wine occasionally   Objective:   BP 128/72 (BP Location: Right Arm, Patient Position: Sitting, Cuff Size: Normal)   Pulse 80   Temp 97.9 F (36.6 C)   Resp 16   Ht 5\' 6"  (1.676 m)   Wt 145 lb (65.8 kg)   BMI 23.40 kg/m   Physical Exam  Constitutional: She is oriented to person, place, and time. She appears well-developed and well-nourished.  HENT:  Head: Normocephalic and atraumatic.  Right Ear: External ear normal.  Left Ear: External ear normal.  Nose: Nose normal.  Eyes: Conjunctivae are normal. No scleral icterus.  Neck: No thyromegaly present.  Cardiovascular: Normal rate, regular rhythm and normal heart sounds.   Pulmonary/Chest: Effort normal and breath sounds normal.  Abdominal: Soft.  Musculoskeletal: She exhibits edema.  Very mild increased thoracic kyphosis.  Lymphadenopathy:    She has no cervical adenopathy.  Neurological: She is alert and oriented to person, place, and time.  Skin: Skin is warm and dry.  Psychiatric: She has a normal mood and affect. Her behavior is normal. Judgment and thought content normal.        Assessment & Plan:     1. Essential hypertension   2. Dyslipidemia 3. History of atrial fibrillation Now followed and treated by Dr. Fletcher Anon. I have  done the exam and reviewed the above chart and it is accurate to the best of my knowledge.        Jillayne Witte Cranford Mon, MD  [facility]

## 2016-03-05 ENCOUNTER — Other Ambulatory Visit: Payer: Self-pay | Admitting: Cardiovascular Disease

## 2016-03-23 ENCOUNTER — Ambulatory Visit (INDEPENDENT_AMBULATORY_CARE_PROVIDER_SITE_OTHER): Payer: Medicare Other | Admitting: *Deleted

## 2016-03-23 DIAGNOSIS — Z5181 Encounter for therapeutic drug level monitoring: Secondary | ICD-10-CM | POA: Diagnosis not present

## 2016-03-23 DIAGNOSIS — Z7901 Long term (current) use of anticoagulants: Secondary | ICD-10-CM

## 2016-03-23 DIAGNOSIS — I4891 Unspecified atrial fibrillation: Secondary | ICD-10-CM

## 2016-03-23 LAB — POCT INR: INR: 2.3

## 2016-04-03 ENCOUNTER — Other Ambulatory Visit: Payer: Self-pay | Admitting: Cardiovascular Disease

## 2016-04-26 ENCOUNTER — Other Ambulatory Visit: Payer: Self-pay | Admitting: Family Medicine

## 2016-04-28 ENCOUNTER — Ambulatory Visit (INDEPENDENT_AMBULATORY_CARE_PROVIDER_SITE_OTHER): Payer: Medicare Other

## 2016-04-28 DIAGNOSIS — Z23 Encounter for immunization: Secondary | ICD-10-CM

## 2016-05-04 ENCOUNTER — Ambulatory Visit (INDEPENDENT_AMBULATORY_CARE_PROVIDER_SITE_OTHER): Payer: Medicare Other

## 2016-05-04 DIAGNOSIS — Z7901 Long term (current) use of anticoagulants: Secondary | ICD-10-CM

## 2016-05-04 DIAGNOSIS — Z5181 Encounter for therapeutic drug level monitoring: Secondary | ICD-10-CM

## 2016-05-04 DIAGNOSIS — I4891 Unspecified atrial fibrillation: Secondary | ICD-10-CM

## 2016-05-04 LAB — POCT INR: INR: 3

## 2016-05-23 ENCOUNTER — Other Ambulatory Visit: Payer: Self-pay | Admitting: Family Medicine

## 2016-05-23 DIAGNOSIS — Z1231 Encounter for screening mammogram for malignant neoplasm of breast: Secondary | ICD-10-CM

## 2016-06-01 ENCOUNTER — Other Ambulatory Visit: Payer: Self-pay | Admitting: Cardiovascular Disease

## 2016-06-02 ENCOUNTER — Ambulatory Visit (INDEPENDENT_AMBULATORY_CARE_PROVIDER_SITE_OTHER): Payer: Medicare Other | Admitting: Family Medicine

## 2016-06-02 DIAGNOSIS — R739 Hyperglycemia, unspecified: Secondary | ICD-10-CM

## 2016-06-02 DIAGNOSIS — I10 Essential (primary) hypertension: Secondary | ICD-10-CM

## 2016-06-02 DIAGNOSIS — C50919 Malignant neoplasm of unspecified site of unspecified female breast: Secondary | ICD-10-CM

## 2016-06-02 DIAGNOSIS — E785 Hyperlipidemia, unspecified: Secondary | ICD-10-CM | POA: Diagnosis not present

## 2016-06-02 DIAGNOSIS — I4891 Unspecified atrial fibrillation: Secondary | ICD-10-CM

## 2016-06-02 NOTE — Progress Notes (Signed)
Subjective:  HPI  Hypertension, follow-up:  BP Readings from Last 3 Encounters:  02/18/16 128/72  02/12/16 110/68  09/03/15 130/90    She was last seen for hypertension 3 months ago.  BP at that visit was 128/72 Outside blood pressures are 120's/70-80. She is experiencing none.  Patient denies chest pain, chest pressure/discomfort, claudication, dyspnea, exertional chest pressure/discomfort, fatigue, irregular heart beat, lower extremity edema, near-syncope, orthopnea, palpitations, paroxysmal nocturnal dyspnea and syncope.     Wt Readings from Last 3 Encounters:  02/18/16 145 lb (65.8 kg)  02/12/16 144 lb 4 oz (65.4 kg)  09/03/15 144 lb 8 oz (65.5 kg)   ------------------------------------------------------------------------ Pt states that she is following up to get back on her 6 month track. She reports that she has been feeling well. Other than her left arm has been hurting since she got her flu vaccine.   Also [employer] forms need to be filled out. She reports that her cardiologist follows her anticoagulation and that has been doing well. She sees them ever 4 weeks. Pt reports that her Breast cancer is stable but she has not had her mammogram yet, but it is scheduled.  Patient has been happily married for over 17 years. Overall she feels very well.  Prior to Admission medications   Medication Sig Start Date End Date Taking? Authorizing Provider  Acetaminophen (TYLENOL PO) Take by mouth as needed.    Historical Provider, MD  amLODipine-benazepril (LOTREL) 5-40 MG capsule TAKE 1 CAPSULE BY MOUTH DAILY. 06/01/16   Wellington Hampshire, MD  atorvastatin (LIPITOR) 10 MG tablet TAKE 1 TABLET BY MOUTH EVERY DAY 04/26/16   Jerrol Banana., MD  cholecalciferol (VITAMIN D) 1000 UNITS tablet Take 1,000 Units by mouth daily.    Historical Provider, MD  COUMADIN 2.5 MG tablet TAKE AS DIRECTED BY COUMADIN CLINIC 02/18/16   Wellington Hampshire, MD  furosemide (LASIX) 20 MG tablet TAKE 1  TABLET (20 MG TOTAL) BY MOUTH DAILY. 04/05/16   Minna Merritts, MD  latanoprost (XALATAN) 0.005 % ophthalmic solution Place into both eyes daily.  08/01/12   Historical Provider, MD  timolol (TIMOPTIC) 0.5 % ophthalmic solution Place into both eyes daily.  08/05/12   Historical Provider, MD    Patient Active Problem List   Diagnosis Date Noted  . Chronic atrial fibrillation ([place]) 05/01/2015  . Atrophic vaginitis 12/15/2014  . Breast CA ([place]) 12/15/2014  . Decrease in the ability to hear 12/15/2014  . Essential (primary) hypertension 12/15/2014  . Fatigue 12/15/2014  . Acid reflux 12/15/2014  . Glaucoma 12/15/2014  . Flu vaccine need 12/15/2014  . Blood glucose elevated 12/15/2014  . Arthritis, degenerative 12/15/2014  . OP (osteoporosis) 12/15/2014  . HLD (hyperlipidemia) 12/15/2014  . Pain of upper abdomen 12/15/2014  . Encounter for therapeutic drug monitoring 09/04/2013  . Preop cardiovascular exam 03/07/2013  . Hypokalemia   . Long term (current) use of anticoagulants 08/22/2012  . Atrial fibrillation ([place])   . Dyslipidemia   . Hypertension   . Breast cancer ([place])   . Shortness of breath   . Chest pain   . Ejection fraction   . Mitral regurgitation   . SHINGLES 07/26/2010    Past Medical History:  Diagnosis Date  . Atrial fibrillation [facility])    New diagnosis, asymptomatic, February, 2014  . Breast cancer ([place]) [place], implant, no chemotherapy or radiation  . Chest pain   . Dyslipidemia   . Ejection fraction  EF 55-65%, January, 2012, question diastolic dysfunction  . Hypertension   . Hypokalemia    February, 2014, potassium started  . Mitral regurgitation    Mild, echo, 2012  . Shingles   . Shortness of breath    January, 0000000, normal systolic function, question diastolic dysfunction    Social History   Social History  . Marital status: Married    Spouse name: N/A  . Number of children: N/A  . Years of education: N/A   Occupational History  .  Not on file.   Social History Main Topics  . Smoking status: Never Smoker  . Smokeless tobacco: Never Used  . Alcohol use No     Comment: red wine occasionally  . Drug use: No  . Sexual activity: No   Other Topics Concern  . Not on file   Social History Narrative  . No narrative on file    Allergies  Allergen Reactions  . Ciprofloxacin Nausea And Vomiting  . Cortisone     Pt stated this makes her turn blue  . Nitrofurantoin Monohyd Macro     GI upset & chills.  . Other     Mycins    Review of Systems  Constitutional: Negative.   HENT: Negative.   Respiratory: Negative.   Cardiovascular: Negative.   Gastrointestinal: Negative.   Genitourinary: Negative.   Musculoskeletal: Negative.   Skin: Negative.   Neurological: Negative.   Endo/Heme/Allergies: Negative.   Psychiatric/Behavioral: Negative.     Immunization History  Administered Date(s) Administered  . Influenza, High Dose Seasonal PF 04/23/2015, 04/28/2016    Objective:  There were no vitals taken for this visit.  Physical Exam  Constitutional: She is oriented to person, place, and time and well-developed, well-nourished, and in no distress.  HENT:  Head: Normocephalic and atraumatic.  Eyes: Conjunctivae are normal. No scleral icterus.  Neck: Normal range of motion. Neck supple. No thyromegaly present.  Cardiovascular: Normal rate, regular rhythm, normal heart sounds and intact distal pulses.   Pulmonary/Chest: Effort normal and breath sounds normal.  Abdominal: Soft.  Neurological: She is alert and oriented to person, place, and time. She has normal reflexes. Gait normal. GCS score is 15.  Skin: Skin is warm and dry.  Psychiatric: Mood, memory, affect and judgment normal.    Lab Results  Component Value Date   WBC 7.1 08/24/2015   HGB 15.2 (H) 08/20/2012   HCT 43.4 08/24/2015   PLT 259 08/24/2015   GLUCOSE 94 08/24/2015   CHOL 168 08/24/2015   TRIG 152 (H) 08/24/2015   HDL 49 08/24/2015    LDLCALC 89 08/24/2015   TSH 3.600 08/24/2015   INR 3.0 05/04/2016   HGBA1C 5.9 (H) 02/12/2015    CMP     Component Value Date/Time   NA 145 (H) 08/24/2015 0912   K 4.0 08/24/2015 0912   K 3.6 07/08/2013 1159   CL 99 08/24/2015 0912   CO2 26 08/24/2015 0912   GLUCOSE 94 08/24/2015 0912   GLUCOSE 119 (H) 03/07/2014 1543   BUN 7 (L) 08/24/2015 0912   CREATININE 0.58 08/24/2015 0912   CALCIUM 9.2 08/24/2015 0912   PROT 6.3 08/24/2015 0912   ALBUMIN 4.1 08/24/2015 0912   AST 20 08/24/2015 0912   ALT 13 08/24/2015 0912   ALKPHOS 54 08/24/2015 0912   BILITOT 1.2 08/24/2015 0912   GFRNONAA 86 08/24/2015 0912   GFRAA 99 08/24/2015 0912    Assessment and Plan :  1. Essential hypertension  -  CBC with Differential/Platelet - TSH - Comprehensive metabolic panel  2. Dyslipidemia  - Lipid Panel With LDL/HDL Ratio  3. Atrial fibrillation, unspecified type ([place]) Followed by Dr. Fletcher Anon  4. Blood glucose elevated/Prediabetes  - Hemoglobin A1c  5. Malignant neoplasm of female breast, unspecified estrogen receptor status, unspecified laterality, unspecified site of breast ([place]) 6.AFib Lifelong anticoagulation.   HPI, Exam, and A&P Transcribed under the direction and in the presence of Sarae Nicholes L. Cranford Mon, MD  Electronically Signed: Webb Laws, CMA I have done the exam and reviewed the above chart and it is accurate to the best of my knowledge. Development worker, community has been used in this note in any air is in the dictation or transcription are unintentional.  [employer] 06/02/2016 2:20 PM

## 2016-06-06 ENCOUNTER — Other Ambulatory Visit: Payer: Self-pay | Admitting: Cardiovascular Disease

## 2016-06-06 MED ORDER — FUROSEMIDE 20 MG PO TABS: 20.0000 mg | ORAL_TABLET | Freq: Every day | ORAL | 0 refills | Status: DC

## 2016-06-06 NOTE — Telephone Encounter (Signed)
Refill sent in for furosemide 20 mg for 90 day supply, no refills.

## 2016-06-07 LAB — CBC WITH DIFFERENTIAL/PLATELET
BASOS ABS: 0 10*3/uL (ref 0.0–0.2)
Basos: 0 %
EOS (ABSOLUTE): 0.4 10*3/uL (ref 0.0–0.4)
Eos: 5 %
HEMATOCRIT: 45.4 % (ref 34.0–46.6)
HEMOGLOBIN: 15.3 g/dL (ref 11.1–15.9)
Immature Grans (Abs): 0 10*3/uL (ref 0.0–0.1)
Immature Granulocytes: 0 %
LYMPHS ABS: 2.1 10*3/uL (ref 0.7–3.1)
Lymphs: 29 %
MCH: 31.5 pg (ref 26.6–33.0)
MCHC: 33.7 g/dL (ref 31.5–35.7)
MCV: 94 fL (ref 79–97)
MONOCYTES: 8 %
Monocytes Absolute: 0.6 10*3/uL (ref 0.1–0.9)
NEUTROS ABS: 4.1 10*3/uL (ref 1.4–7.0)
Neutrophils: 58 %
Platelets: 302 10*3/uL (ref 150–379)
RBC: 4.85 x10E6/uL (ref 3.77–5.28)
RDW: 13 % (ref 12.3–15.4)
WBC: 7.2 10*3/uL (ref 3.4–10.8)

## 2016-06-07 LAB — LIPID PANEL WITH LDL/HDL RATIO
Cholesterol, Total: 163 mg/dL (ref 100–199)
HDL: 50 mg/dL (ref >39)
LDL CALC: 77 mg/dL (ref 0–99)
LDL/HDL RATIO: 1.5 ratio (ref 0.0–3.2)
Triglycerides: 179 mg/dL — ABNORMAL HIGH (ref 0–149)
VLDL CHOLESTEROL CAL: 36 mg/dL (ref 5–40)

## 2016-06-07 LAB — COMPREHENSIVE METABOLIC PANEL
A/G RATIO: 2.1 (ref 1.2–2.2)
ALK PHOS: 66 IU/L (ref 39–117)
ALT: 16 IU/L (ref 0–32)
AST: 20 IU/L (ref 0–40)
Albumin: 4.4 g/dL (ref 3.5–4.7)
BILIRUBIN TOTAL: 0.8 mg/dL (ref 0.0–1.2)
BUN/Creatinine Ratio: 12 (ref 12–28)
BUN: 7 mg/dL — ABNORMAL LOW (ref 8–27)
CHLORIDE: 96 mmol/L (ref 96–106)
CO2: 29 mmol/L (ref 18–29)
Calcium: 9.7 mg/dL (ref 8.7–10.3)
Creatinine, Ser: 0.6 mg/dL (ref 0.57–1.00)
GFR calc Af Amer: 97 mL/min/{1.73_m2} (ref >59)
GFR calc non Af Amer: 85 mL/min/{1.73_m2} (ref >59)
Globulin, Total: 2.1 g/dL (ref 1.5–4.5)
Glucose: 98 mg/dL (ref 65–99)
POTASSIUM: 4 mmol/L (ref 3.5–5.2)
Sodium: 141 mmol/L (ref 134–144)
Total Protein: 6.5 g/dL (ref 6.0–8.5)

## 2016-06-07 LAB — HEMOGLOBIN A1C
Est. average glucose Bld gHb Est-mCnc: 114 mg/dL
HEMOGLOBIN A1C: 5.6 % (ref 4.8–5.6)

## 2016-06-07 LAB — TSH: TSH: 3.87 u[IU]/mL (ref 0.450–4.500)

## 2016-06-15 ENCOUNTER — Ambulatory Visit (INDEPENDENT_AMBULATORY_CARE_PROVIDER_SITE_OTHER): Payer: Medicare Other

## 2016-06-15 DIAGNOSIS — I4891 Unspecified atrial fibrillation: Secondary | ICD-10-CM | POA: Diagnosis not present

## 2016-06-15 DIAGNOSIS — Z5181 Encounter for therapeutic drug level monitoring: Secondary | ICD-10-CM | POA: Diagnosis not present

## 2016-06-15 DIAGNOSIS — Z7901 Long term (current) use of anticoagulants: Secondary | ICD-10-CM | POA: Diagnosis not present

## 2016-06-15 LAB — POCT INR: INR: 3

## 2016-06-17 ENCOUNTER — Other Ambulatory Visit: Payer: Self-pay | Admitting: Cardiovascular Disease

## 2016-06-17 NOTE — Telephone Encounter (Signed)
Review for refill. 

## 2016-07-01 ENCOUNTER — Ambulatory Visit
Admission: RE | Admit: 2016-07-01 | Discharge: 2016-07-01 | Disposition: A | Discharge status: Home / Self Care | Payer: Medicare Other | Source: Ambulatory Visit | Attending: Family Medicine | Admitting: Family Medicine

## 2016-07-01 DIAGNOSIS — Z1231 Encounter for screening mammogram for malignant neoplasm of breast: Secondary | ICD-10-CM | POA: Insufficient documentation

## 2016-07-26 ENCOUNTER — Other Ambulatory Visit: Payer: Self-pay | Admitting: *Deleted

## 2016-07-26 MED ORDER — AMLODIPINE BESY-BENAZEPRIL HCL 5-40 MG PO CAPS: 1.0000 | ORAL_CAPSULE | Freq: Every day | ORAL | 3 refills | Status: DC

## 2016-07-26 NOTE — Telephone Encounter (Signed)
Requested Prescriptions   Signed Prescriptions Disp Refills  . amLODipine-benazepril (LOTREL) 5-40 MG capsule 90 capsule 3    Sig: Take 1 capsule by mouth daily.    Authorizing Provider: Kathlyn Sacramento A    Ordering User: Britt Bottom

## 2016-07-27 ENCOUNTER — Ambulatory Visit (INDEPENDENT_AMBULATORY_CARE_PROVIDER_SITE_OTHER): Payer: Medicare Other | Admitting: *Deleted

## 2016-07-27 DIAGNOSIS — Z5181 Encounter for therapeutic drug level monitoring: Secondary | ICD-10-CM | POA: Diagnosis not present

## 2016-07-27 DIAGNOSIS — I4891 Unspecified atrial fibrillation: Secondary | ICD-10-CM | POA: Diagnosis not present

## 2016-07-27 DIAGNOSIS — Z7901 Long term (current) use of anticoagulants: Secondary | ICD-10-CM

## 2016-07-27 LAB — POCT INR: INR: 2.1

## 2016-08-11 ENCOUNTER — Ambulatory Visit (INDEPENDENT_AMBULATORY_CARE_PROVIDER_SITE_OTHER): Payer: Medicare Other | Admitting: Cardiovascular Disease

## 2016-08-11 ENCOUNTER — Encounter: Payer: Self-pay | Admitting: Cardiovascular Disease

## 2016-08-11 VITALS — BP 134/86 | HR 78 | Ht 66.0 in | Wt 141.5 lb

## 2016-08-11 DIAGNOSIS — I482 Chronic atrial fibrillation, unspecified: Secondary | ICD-10-CM

## 2016-08-11 DIAGNOSIS — I10 Essential (primary) hypertension: Secondary | ICD-10-CM

## 2016-08-11 DIAGNOSIS — I5032 Chronic diastolic (congestive) heart failure: Secondary | ICD-10-CM | POA: Diagnosis not present

## 2016-08-11 NOTE — Progress Notes (Signed)
Cardiology Office Note   Date:  08/11/2016   ID:  Latasha Anderson, Latasha Anderson 20-May-1931, MRN [id number]  PCP:  Wilhemena Durie, MD  Cardiologist:   Kathlyn Sacramento, MD   Chief Complaint  Patient presents with  . other    6 month f/u no complaints today. Meds reviewed verbally with pt.      History of Present Illness: Latasha Anderson is a 83 y.o. female who presents for a follow-up visit regarding chronic atrial fibrillation and chronic diastolic heart failure.  She has known history of chronic atrial fibrillation on long-term anticoagulation with warfarin, hypertension and hyperlipidemia. She has no history of ischemic heart disease.  Echocardiogram in 06/2015 showed normal LV systolic function, mild mitral regurgitation and moderate pulmonary hypertension with an estimated peak systolic pressure of 51 mmHg with a dilated IVC.  Volume overload improved significantly with small dose furosemide. She is also suspected of having chronic venous insufficiency.  She has been doing extremely well with no chest pain, shortness of breath, palpitations or dizziness. She is very compliant with her medications and her INR has been therapeutic.  Past Medical History:  Diagnosis Date  . Atrial fibrillation [facility])    New diagnosis, asymptomatic, February, 2014  . Breast cancer ([place]) 1985   RT MASTECTOMY  . Chest pain   . Dyslipidemia   . Ejection fraction    EF 55-65%, January, 2012, question diastolic dysfunction  . Hypertension   . Hypokalemia    February, 2014, potassium started  . Mitral regurgitation    Mild, echo, 2012  . Shingles   . Shortness of breath    January, 0000000, normal systolic function, question diastolic dysfunction    Past Surgical History:  Procedure Laterality Date  . ABDOMINAL HYSTERECTOMY     age 40  . AUGMENTATION MAMMAPLASTY Right 1985   RT MASTECTOMY FOR BREAST CA  . EYE SURGERY     cataract  . MASTECTOMY Right 1985   reconstruction inplant as well      Current Outpatient Prescriptions  Medication Sig Dispense Refill  . Acetaminophen (TYLENOL PO) Take by mouth as needed.    Marland Kitchen amLODipine-benazepril (LOTREL) 5-40 MG capsule Take 1 capsule by mouth daily. 90 capsule 3  . atorvastatin (LIPITOR) 10 MG tablet TAKE 1 TABLET BY MOUTH EVERY DAY 90 tablet 3  . cholecalciferol (VITAMIN D) 1000 UNITS tablet Take 1,000 Units by mouth daily.    Marland Kitchen COUMADIN 2.5 MG tablet TAKE AS DIRECTED BY COUMADIN CLINIC 35 tablet 3  . furosemide (LASIX) 20 MG tablet Take 1 tablet (20 mg total) by mouth daily. 90 tablet 0  . latanoprost (XALATAN) 0.005 % ophthalmic solution Place into both eyes daily.     . timolol (TIMOPTIC) 0.5 % ophthalmic solution Place into both eyes daily.      No current facility-administered medications for this visit.     Allergies:   Ciprofloxacin; Cortisone; Nitrofurantoin monohyd macro; and Other    Social History:  The patient  reports that she has never smoked. She has never used smokeless tobacco. She reports that she does not drink alcohol or use drugs.   Family History:  The patient's family history includes Cancer in her brother; Diabetes in her father; Stroke in her mother.      PHYSICAL EXAM: VS:  BP 134/86 (BP Location: Left Arm, Patient Position: Sitting, Cuff Size: Normal)   Pulse 78   Ht 5\' 6"  (1.676 m)   Wt 141 lb 8 oz (  64.2 kg)   BMI 22.84 kg/m  , BMI Body mass index is 22.84 kg/m. GEN: Well nourished, well developed, in no acute distress  HEENT: normal  Neck: no JVD, carotid bruits, or masses Cardiac: Irregularly irregular; no murmurs, rubs, or gallops,no edema  Respiratory:  clear to auscultation bilaterally, normal work of breathing GI: soft, nontender, nondistended, + BS MS: no deformity or atrophy  Skin: warm and dry, no rash Neuro:  Strength and sensation are intact Psych: euthymic mood, full affect   EKG:  EKG is ordered today. The ekg ordered today demonstrates : Atrial fibrillation with  nonspecific ST changes.   Recent Labs: 06/06/2016: ALT 16; BUN 7; Creatinine, Ser 0.60; Platelets 302; Potassium 4.0; Sodium 141; TSH 3.870    Lipid Panel    Component Value Date/Time   CHOL 163 06/06/2016 0832   TRIG 179 (H) 06/06/2016 0832   HDL 50 06/06/2016 0832   CHOLHDL 3.4 08/24/2015 0912   LDLCALC 77 06/06/2016 0832      Wt Readings from Last 3 Encounters:  08/11/16 141 lb 8 oz (64.2 kg)  02/18/16 145 lb (65.8 kg)  02/12/16 144 lb 4 oz (65.4 kg)         ASSESSMENT AND PLAN:  1.  Chronic atrial fibrillation: Ventricular rate is well controlled and she is tolerating anticoagulation with warfarin with no complications. Continue same treatment. I reviewed her labs in December which were unremarkable.  2.  Chronic diastolic heart failure: She appears to be euvolemic on small dose furosemide  3.  Essential hypertension: Blood pressure is well controlled on current medications.    Disposition:   FU with me  6 months  Signed, Kathlyn Sacramento, MD  08/11/2016 11:40 AM    [place]

## 2016-08-11 NOTE — Patient Instructions (Signed)
Medication Instructions: Continue same medications.   Labwork: None.   Procedures/Testing: None.   Follow-Up: 6 months with Dr. Alaisa Moffitt.   Any Additional Special Instructions Will Be Listed Below (If Applicable).     If you need a refill on your cardiac medications before your next appointment, please call your pharmacy.   

## 2016-08-19 ENCOUNTER — Other Ambulatory Visit: Payer: Self-pay | Admitting: *Deleted

## 2016-08-19 MED ORDER — AMLODIPINE BESY-BENAZEPRIL HCL 5-40 MG PO CAPS: 1.0000 | ORAL_CAPSULE | Freq: Every day | ORAL | 3 refills | Status: DC

## 2016-09-07 ENCOUNTER — Telehealth: Payer: Self-pay

## 2016-09-07 ENCOUNTER — Ambulatory Visit (INDEPENDENT_AMBULATORY_CARE_PROVIDER_SITE_OTHER): Payer: Medicare Other

## 2016-09-07 DIAGNOSIS — Z5181 Encounter for therapeutic drug level monitoring: Secondary | ICD-10-CM | POA: Diagnosis not present

## 2016-09-07 DIAGNOSIS — Z7901 Long term (current) use of anticoagulants: Secondary | ICD-10-CM | POA: Diagnosis not present

## 2016-09-07 DIAGNOSIS — I4891 Unspecified atrial fibrillation: Secondary | ICD-10-CM

## 2016-09-07 LAB — POCT INR: INR: 2.5

## 2016-09-07 NOTE — Telephone Encounter (Signed)
Would you please call pt and advise her I would prefer to see here the week before, not the week after since she is already going 6 weeks.  Please reschedule appt to 10/12/16.  Thank you

## 2016-09-07 NOTE — Telephone Encounter (Signed)
Patient has a dental appt on 10/19/16 and wants to be sure it is ok to rs a week out on the next Wednesday 4/25. If not ok please call .

## 2016-10-09 ENCOUNTER — Other Ambulatory Visit: Payer: Self-pay | Admitting: Cardiovascular Disease

## 2016-10-12 ENCOUNTER — Ambulatory Visit (INDEPENDENT_AMBULATORY_CARE_PROVIDER_SITE_OTHER): Payer: Medicare Other

## 2016-10-12 DIAGNOSIS — Z5181 Encounter for therapeutic drug level monitoring: Secondary | ICD-10-CM | POA: Diagnosis not present

## 2016-10-12 DIAGNOSIS — I4891 Unspecified atrial fibrillation: Secondary | ICD-10-CM

## 2016-10-12 DIAGNOSIS — Z7901 Long term (current) use of anticoagulants: Secondary | ICD-10-CM

## 2016-10-12 LAB — POCT INR: INR: 2.4

## 2016-11-06 ENCOUNTER — Other Ambulatory Visit: Payer: Self-pay | Admitting: Cardiovascular Disease

## 2016-11-07 NOTE — Telephone Encounter (Signed)
Refill Request.  

## 2016-11-23 ENCOUNTER — Ambulatory Visit (INDEPENDENT_AMBULATORY_CARE_PROVIDER_SITE_OTHER): Payer: Medicare Other

## 2016-11-23 DIAGNOSIS — Z7901 Long term (current) use of anticoagulants: Secondary | ICD-10-CM | POA: Diagnosis not present

## 2016-11-23 DIAGNOSIS — I4891 Unspecified atrial fibrillation: Secondary | ICD-10-CM | POA: Diagnosis not present

## 2016-11-23 DIAGNOSIS — Z5181 Encounter for therapeutic drug level monitoring: Secondary | ICD-10-CM

## 2016-11-23 LAB — POCT INR: INR: 2.9

## 2016-11-24 ENCOUNTER — Ambulatory Visit (INDEPENDENT_AMBULATORY_CARE_PROVIDER_SITE_OTHER): Payer: Medicare Other | Admitting: Family Medicine

## 2016-11-24 ENCOUNTER — Ambulatory Visit (INDEPENDENT_AMBULATORY_CARE_PROVIDER_SITE_OTHER): Payer: Medicare Other

## 2016-11-24 VITALS — BP 146/78 | HR 72 | Temp 97.8°F | Ht 66.0 in | Wt 140.6 lb

## 2016-11-24 DIAGNOSIS — E782 Mixed hyperlipidemia: Secondary | ICD-10-CM

## 2016-11-24 DIAGNOSIS — I482 Chronic atrial fibrillation: Secondary | ICD-10-CM | POA: Diagnosis not present

## 2016-11-24 DIAGNOSIS — Z0000 Encounter for general adult medical examination without abnormal findings: Secondary | ICD-10-CM

## 2016-11-24 DIAGNOSIS — I4821 Permanent atrial fibrillation: Secondary | ICD-10-CM

## 2016-11-24 DIAGNOSIS — I10 Essential (primary) hypertension: Secondary | ICD-10-CM

## 2016-11-24 NOTE — Progress Notes (Signed)
Patient: Latasha Anderson Female    DOB: [birth year]   83 y.o.   MRN: [id number] Visit Date: 11/24/2016  Today's Provider: Wilhemena Durie, MD   Chief Complaint  Patient presents with  . Hypertension  . Hyperlipidemia  . Hyperglycemia  . Atrial Fibrillation   Subjective:    HPI  Hypertension, follow-up:  BP Readings from Last 3 Encounters:  11/24/16 (!) 146/78  08/11/16 134/86  02/18/16 128/72    She was last seen for hypertension 6 months ago.  BP at that visit was 134/86. Management since that visit includes no changes. She reports good compliance with treatment. She is not having side effects.  She is not exercising. She is adherent to low salt diet.   Outside blood pressures are checked occasionally. Cardiovascular risk factors include dyslipidemia.     Weight trend: stable Wt Readings from Last 3 Encounters:  11/24/16 140 lb 9.6 oz (63.8 kg)  08/11/16 141 lb 8 oz (64.2 kg)  02/18/16 145 lb (65.8 kg)    Current diet: well balanced     Lipid/Cholesterol, Follow-up:   Last seen for this6 months ago.  Management changes since that visit include no changes. . Last Lipid Panel:    Component Value Date/Time   CHOL 163 06/06/2016 0832   TRIG 179 (H) 06/06/2016 0832   HDL 50 06/06/2016 0832   CHOLHDL 3.4 08/24/2015 0912   LDLCALC 77 06/06/2016 0832    Risk factors for vascular disease include hypertension  She reports good compliance with treatment. She is not having side effects.  Current symptoms include none and have been stable. Weight trend: stable Prior visit with dietician: no Current diet: well balanced Current exercise: walking  Wt Readings from Last 3 Encounters:  11/24/16 140 lb 9.6 oz (63.8 kg)  08/11/16 141 lb 8 oz (64.2 kg)  02/18/16 145 lb (65.8 kg)      Hyperglycemia, Follow-up:   Lab Results  Component Value Date   HGBA1C 5.6 06/06/2016   HGBA1C 5.9 (H) 02/12/2015   GLUCOSE 98 06/06/2016   GLUCOSE 94  08/24/2015   GLUCOSE 109 (H) 06/17/2015    Last seen for for this 6 months ago.  Management since then includes no changes. Current symptoms include none and have been stable.  Weight trend: stable Prior visit with dietician: no Current diet: well balanced Current exercise: walking  Pertinent Labs:    Component Value Date/Time   CHOL 163 06/06/2016 0832   TRIG 179 (H) 06/06/2016 0832   CHOLHDL 3.4 08/24/2015 0912   CREATININE 0.60 06/06/2016 0832    Wt Readings from Last 3 Encounters:  11/24/16 140 lb 9.6 oz (63.8 kg)  08/11/16 141 lb 8 oz (64.2 kg)  02/18/16 145 lb (65.8 kg)   A-Fib, follow up: Patient was last seen 6 months ago. No changes were made since last OV. Patient is currently taking 1 tablet (2.5mg ) everyday except 1/2 tablet (1.25mg ) on Wednesday.  She just had her INR checked yesterday (by Cardiology) and it was 2.9.     6CIT Screen 11/24/2016  What Year? 0 points  What month? 0 points  What time? 0 points  Count back from 20 0 points  Months in reverse 0 points  Repeat phrase 2 points  Total Score 2    Depression screen [facility] 2/9 11/24/2016 11/24/2016 02/12/2015  Decreased Interest 0 0 0  Down, Depressed, Hopeless 0 0 0  PHQ - 2 Score 0 0 0  Altered  sleeping 1 - -  Tired, decreased energy 1 - -  Change in appetite 0 - -  Feeling bad or failure about yourself  1 - -  Trouble concentrating 0 - -  Moving slowly or fidgety/restless 0 - -  Suicidal thoughts 0 - -  PHQ-9 Score 3 - -       Allergies  Allergen Reactions  . Ciprofloxacin Nausea And Vomiting  . Cortisone     Pt stated this makes her turn blue  . Nitrofurantoin Monohyd Macro     GI upset & chills.  . Other     Mycins     Current Outpatient Prescriptions:  .  Acetaminophen (TYLENOL PO), Take by mouth as needed., Disp: , Rfl:  .  amLODipine-benazepril (LOTREL) 5-40 MG capsule, Take 1 capsule by mouth daily., Disp: 90 capsule, Rfl: 3 .  atorvastatin (LIPITOR) 10 MG tablet, TAKE 1  TABLET BY MOUTH EVERY DAY, Disp: 90 tablet, Rfl: 3 .  cholecalciferol (VITAMIN D) 1000 UNITS tablet, Take 1,000 Units by mouth daily., Disp: , Rfl:  .  COUMADIN 2.5 MG tablet, TAKE AS DIRECTED BY COUMADIN CLINIC, Disp: 35 tablet, Rfl: 3 .  furosemide (LASIX) 20 MG tablet, TAKE 1 TABLET (20 MG TOTAL) BY MOUTH DAILY., Disp: 90 tablet, Rfl: 3 .  latanoprost (XALATAN) 0.005 % ophthalmic solution, Place into both eyes daily. , Disp: , Rfl:  .  timolol (TIMOPTIC) 0.5 % ophthalmic solution, Place into both eyes daily. , Disp: , Rfl:   Review of Systems  Constitutional: Negative.   HENT: Negative.   Eyes: Negative.   Respiratory: Negative.   Cardiovascular: Negative.   Gastrointestinal: Negative.   Endocrine: Negative.   Genitourinary: Negative.   Musculoskeletal: Negative.   Allergic/Immunologic: Negative.   Neurological: Negative.   Hematological: Negative.   Psychiatric/Behavioral: Negative.     Social History  Substance Use Topics  . Smoking status: Never Smoker  . Smokeless tobacco: Never Used  . Alcohol use No     Comment: red wine   Objective:     BP    146/78 (BP Location: Right Arm)     Pulse  72     Temp  97.8 F (36.6 C) (Oral)     Ht  5\' 6"  (1.676 m)     Wt  140 lb 9.6 oz (63.8 kg)      BMI  22.69 kg/m       Physical Exam  Constitutional: She is oriented to person, place, and time. She appears well-developed and well-nourished.  HENT:  Head: Normocephalic and atraumatic.  Right Ear: External ear normal.  Left Ear: External ear normal.  Nose: Nose normal.  Eyes: Conjunctivae are normal. No scleral icterus.  Neck: Neck supple. No thyromegaly present.  Cardiovascular: Normal rate, regular rhythm and normal heart sounds.   Pulmonary/Chest: Effort normal and breath sounds normal.  Abdominal: Soft.  Neurological: She is alert and oriented to person, place, and time.  Skin: Skin is warm and dry.  Psychiatric: She has a normal mood and affect. Her  behavior is normal. Judgment and thought content normal.        Assessment & Plan:     HTN HLD AFIB All stable.      I have done the exam and reviewed the chart and it is accurate to the best of my knowledge. Development worker, community has been used and  any errors in dictation or transcription are unintentional. Miguel Aschoff M.D. [facility]   Wilhemena Durie, MD  [facility]

## 2016-11-24 NOTE — Progress Notes (Signed)
Subjective:   Latasha Anderson is a 83 y.o. female who presents for Medicare Annual (Subsequent) preventive examination.  Review of Systems:  N/A  Cardiac Risk Factors include: advanced age (>84men, >82 women);dyslipidemia;hypertension     Objective:     Vitals: BP (!) 146/78 (BP Location: Right Arm)   Pulse 72   Temp 97.8 F (36.6 C) (Oral)   Ht 5\' 6"  (1.676 m)   Wt 140 lb 9.6 oz (63.8 kg)   BMI 22.69 kg/m   Body mass index is 22.69 kg/m.   Tobacco History  Smoking Status  . Never Smoker  Smokeless Tobacco  . Never Used     Counseling given: Not Answered   Past Medical History:  Diagnosis Date  . Atrial fibrillation [facility])    New diagnosis, asymptomatic, February, 2014  . Breast cancer ([place]) 1985   RT MASTECTOMY  . Chest pain   . Dyslipidemia   . Ejection fraction    EF 55-65%, January, 2012, question diastolic dysfunction  . Hypertension   . Hypokalemia    February, 2014, potassium started  . Mitral regurgitation    Mild, echo, 2012  . Shingles   . Shortness of breath    January, 9371, normal systolic function, question diastolic dysfunction   Past Surgical History:  Procedure Laterality Date  . ABDOMINAL HYSTERECTOMY     age 83  . AUGMENTATION MAMMAPLASTY Right 1985   RT MASTECTOMY FOR BREAST CA  . EYE SURGERY     cataract  . MASTECTOMY Right 1985   reconstruction inplant as well   Family History  Problem Relation Age of Onset  . Stroke Mother   . Diabetes Father   . Cancer Brother        prostate  . Breast cancer Neg Hx    History  Sexual Activity  . Sexual activity: No    Outpatient Encounter Prescriptions as of 11/24/2016  Medication Sig  . Acetaminophen (TYLENOL PO) Take by mouth as needed.  Marland Kitchen amLODipine-benazepril (LOTREL) 5-40 MG capsule Take 1 capsule by mouth daily.  Marland Kitchen atorvastatin (LIPITOR) 10 MG tablet TAKE 1 TABLET BY MOUTH EVERY DAY  . cholecalciferol (VITAMIN D) 1000 UNITS tablet Take 1,000 Units by mouth daily.  Marland Kitchen  COUMADIN 2.5 MG tablet TAKE AS DIRECTED BY COUMADIN CLINIC  . furosemide (LASIX) 20 MG tablet TAKE 1 TABLET (20 MG TOTAL) BY MOUTH DAILY.  Marland Kitchen latanoprost (XALATAN) 0.005 % ophthalmic solution Place into both eyes daily.   . timolol (TIMOPTIC) 0.5 % ophthalmic solution Place into both eyes daily.    No facility-administered encounter medications on file as of 11/24/2016.     Activities of Daily Living In your present state of health, do you have any difficulty performing the following activities: 11/24/2016 06/02/2016  Hearing? Y N  Vision? N N  Difficulty concentrating or making decisions? N N  Walking or climbing stairs? Y N  Dressing or bathing? N N  Doing errands, shopping? N N  Preparing Food and eating ? N -  Using the Toilet? N -  In the past six months, have you accidently leaked urine? N -  Do you have problems with loss of bowel control? N -  Managing your Medications? N -  Managing your Finances? N -  Housekeeping or managing your Housekeeping? N -  Some recent data might be hidden    Patient Care Team: Jerrol Banana., MD as PCP - General (Unknown Physician Specialty) Estill Cotta, MD as Consulting  Physician (Ophthalmology) Wellington Hampshire, MD as Consulting Physician (Cardiology)    Assessment:     Exercise Activities and Dietary recommendations Current Exercise Habits: Home exercise routine, Type of exercise: Other - see comments (bicycle), Time (Minutes): 10 (5-6 minutes), Frequency (Times/Week): 6, Weekly Exercise (Minutes/Week): 60, Intensity: Mild  Goals    . Increase water intake          Recommend increasing water intake to 4-6 glasses a day.      Fall Risk Fall Risk  11/24/2016 02/12/2015  Falls in the past year? No No   Depression Screen PHQ 2/9 Scores 11/24/2016 11/24/2016 02/12/2015  PHQ - 2 Score 0 0 0  PHQ- 9 Score 3 - -     Cognitive Function     6CIT Screen 11/24/2016  What Year? 0 points  What month? 0 points  What time? 0  points  Count back from 20 0 points  Months in reverse 0 points  Repeat phrase 2 points  Total Score 2    Immunization History  Administered Date(s) Administered  . Influenza, High Dose Seasonal PF 04/23/2015, 04/28/2016  . Pneumococcal Conjugate-13 01/16/2014  . Pneumococcal Polysaccharide-23 05/13/2006  . Td 10/13/2003  . Tdap 01/16/2014   Screening Tests Health Maintenance  Topic Date Due  . DEXA SCAN  07/04/2026 (Originally 11/30/1997)  . INFLUENZA VACCINE  02/01/2017  . TETANUS/TDAP  01/17/2024  . PNA vac Low Risk Adult  Completed      Plan:  I have personally reviewed and addressed the Medicare Annual Wellness questionnaire and have noted the following in the patient's chart:  A. Medical and social history B. Use of alcohol, tobacco or illicit drugs  C. Current medications and supplements D. Functional ability and status E.  Nutritional status F.  Physical activity G. Advance directives H. List of other physicians I.  Hospitalizations, surgeries, and ER visits in previous 12 months J.  [place] such as hearing and vision if needed, cognitive and depression L. Referrals and appointments - none  In addition, I have reviewed and discussed with patient certain preventive protocols, quality metrics, and best practice recommendations. A written personalized care plan for preventive services as well as general preventive health recommendations were provided to patient.  See attached scanned questionnaire for additional information.   Signed,  Fabio Neighbors, LPN Nurse Health Advisor   MD Recommendations: None, pt declined DEXA scan referral today. I have reviewed the health advisors note, was  available for consultation and I agree with documentation and plan. Miguel Aschoff MD [facility]

## 2016-11-24 NOTE — Patient Instructions (Signed)
Latasha Anderson , Thank you for taking time to come for your Medicare Wellness Visit. I appreciate your ongoing commitment to your health goals. Please review the following plan we discussed and let me know if I can assist you in the future.   Screening recommendations/referrals: Colonoscopy: completed 08/24/10 Mammogram: completed 07/05/16, due 07/2017 Bone Density: declined referral today Recommended yearly ophthalmology/optometry visit for glaucoma screening and checkup Recommended yearly dental visit for hygiene and checkup  Vaccinations: Influenza vaccine: up to date, due 03/2017 Pneumococcal vaccine: completed series Tdap vaccine: completed 01/16/14, due 01/2024 Shingles vaccine: declined    Advanced directives: Advance directive discussed with you today. I have provided a copy for you to complete at home and have notarized. Once this is complete please bring a copy in to our office so we can scan it into your chart.  Conditions/risks identified: Recommend increasing water intake to 4-6 glasses a day.  Next appointment: None, need to schedule a 1 year AWV.   Preventive Care 65 Years and Older, Female Preventive care refers to lifestyle choices and visits with your health care provider that can promote health and wellness. What does preventive care include?  A yearly physical exam. This is also called an annual well check.  Dental exams once or twice a year.  Routine eye exams. Ask your health care provider how often you should have your eyes checked.  Personal lifestyle choices, including:  Daily care of your teeth and gums.  Regular physical activity.  Eating a healthy diet.  Avoiding tobacco and drug use.  Limiting alcohol use.  Practicing safe sex.  Taking low-dose aspirin every day.  Taking vitamin and mineral supplements as recommended by your health care provider. What happens during an annual well check? The services and screenings done by your health care  provider during your annual well check will depend on your age, overall health, lifestyle risk factors, and family history of disease. Counseling  Your health care provider may ask you questions about your:  Alcohol use.  Tobacco use.  Drug use.  Emotional well-being.  Home and relationship well-being.  Sexual activity.  Eating habits.  History of falls.  Memory and ability to understand (cognition).  Work and work Statistician.  Reproductive health. Screening  You may have the following tests or measurements:  Height, weight, and BMI.  Blood pressure.  Lipid and cholesterol levels. These may be checked every 5 years, or more frequently if you are over 110 years old.  Skin check.  Lung cancer screening. You may have this screening every year starting at age 39 if you have a 30-pack-year history of smoking and currently smoke or have quit within the past 15 years.  Fecal occult blood test (FOBT) of the stool. You may have this test every year starting at age 69.  Flexible sigmoidoscopy or colonoscopy. You may have a sigmoidoscopy every 5 years or a colonoscopy every 10 years starting at age 89.  Hepatitis C blood test.  Hepatitis B blood test.  Sexually transmitted disease (STD) testing.  Diabetes screening. This is done by checking your blood sugar (glucose) after you have not eaten for a while (fasting). You may have this done every 1-3 years.  Bone density scan. This is done to screen for osteoporosis. You may have this done starting at age 48.  Mammogram. This may be done every 1-2 years. Talk to your health care provider about how often you should have regular mammograms. Talk with your health care provider about  your test results, treatment options, and if necessary, the need for more tests. Vaccines  Your health care provider may recommend certain vaccines, such as:  Influenza vaccine. This is recommended every year.  Tetanus, diphtheria, and acellular  pertussis (Tdap, Td) vaccine. You may need a Td booster every 10 years.  Zoster vaccine. You may need this after age 66.  Pneumococcal 13-valent conjugate (PCV13) vaccine. One dose is recommended after age 50.  Pneumococcal polysaccharide (PPSV23) vaccine. One dose is recommended after age 19. Talk to your health care provider about which screenings and vaccines you need and how often you need them. This information is not intended to replace advice given to you by your health care provider. Make sure you discuss any questions you have with your health care provider. Document Released: 07/17/2015 Document Revised: 03/09/2016 Document Reviewed: 04/21/2015 Elsevier Interactive Patient Education  2017 [place] Prevention in the Home Falls can cause injuries. They can happen to people of all ages. There are many things you can do to make your home safe and to help prevent falls. What can I do on the outside of my home?  Regularly fix the edges of walkways and driveways and fix any cracks.  Remove anything that might make you trip as you walk through a door, such as a raised step or threshold.  Trim any bushes or trees on the path to your home.  Use bright outdoor lighting.  Clear any walking paths of anything that might make someone trip, such as rocks or tools.  Regularly check to see if handrails are loose or broken. Make sure that both sides of any steps have handrails.  Any raised decks and porches should have guardrails on the edges.  Have any leaves, snow, or ice cleared regularly.  Use sand or salt on walking paths during winter.  Clean up any spills in your garage right away. This includes oil or grease spills. What can I do in the bathroom?  Use night lights.  Install grab bars by the toilet and in the tub and shower. Do not use towel bars as grab bars.  Use non-skid mats or decals in the tub or shower.  If you need to sit down in the shower, use a plastic,  non-slip stool.  Keep the floor dry. Clean up any water that spills on the floor as soon as it happens.  Remove soap buildup in the tub or shower regularly.  Attach bath mats securely with double-sided non-slip rug tape.  Do not have throw rugs and other things on the floor that can make you trip. What can I do in the bedroom?  Use night lights.  Make sure that you have a light by your bed that is easy to reach.  Do not use any sheets or blankets that are too big for your bed. They should not hang down onto the floor.  Have a firm chair that has side arms. You can use this for support while you get dressed.  Do not have throw rugs and other things on the floor that can make you trip. What can I do in the kitchen?  Clean up any spills right away.  Avoid walking on wet floors.  Keep items that you use a lot in easy-to-reach places.  If you need to reach something above you, use a strong step stool that has a grab bar.  Keep electrical cords out of the way.  Do not use floor polish or wax  that makes floors slippery. If you must use wax, use non-skid floor wax.  Do not have throw rugs and other things on the floor that can make you trip. What can I do with my stairs?  Do not leave any items on the stairs.  Make sure that there are handrails on both sides of the stairs and use them. Fix handrails that are broken or loose. Make sure that handrails are as long as the stairways.  Check any carpeting to make sure that it is firmly attached to the stairs. Fix any carpet that is loose or worn.  Avoid having throw rugs at the top or bottom of the stairs. If you do have throw rugs, attach them to the floor with carpet tape.  Make sure that you have a light switch at the top of the stairs and the bottom of the stairs. If you do not have them, ask someone to add them for you. What else can I do to help prevent falls?  Wear shoes that:  Do not have high heels.  Have rubber  bottoms.  Are comfortable and fit you well.  Are closed at the toe. Do not wear sandals.  If you use a stepladder:  Make sure that it is fully opened. Do not climb a closed stepladder.  Make sure that both sides of the stepladder are locked into place.  Ask someone to hold it for you, if possible.  Clearly mark and make sure that you can see:  Any grab bars or handrails.  First and last steps.  Where the edge of each step is.  Use tools that help you move around (mobility aids) if they are needed. These include:  Canes.  Walkers.  Scooters.  Crutches.  Turn on the lights when you go into a dark area. Replace any light bulbs as soon as they burn out.  Set up your furniture so you have a clear path. Avoid moving your furniture around.  If any of your floors are uneven, fix them.  If there are any pets around you, be aware of where they are.  Review your medicines with your doctor. Some medicines can make you feel dizzy. This can increase your chance of falling. Ask your doctor what other things that you can do to help prevent falls. This information is not intended to replace advice given to you by your health care provider. Make sure you discuss any questions you have with your health care provider. Document Released: 04/16/2009 Document Revised: 11/26/2015 Document Reviewed: 07/25/2014 Elsevier Interactive Patient Education  2017 [employer].

## 2016-12-01 ENCOUNTER — Ambulatory Visit: Payer: Medicare Other

## 2016-12-01 ENCOUNTER — Ambulatory Visit: Payer: Medicare Other | Admitting: Family Medicine

## 2017-01-11 ENCOUNTER — Ambulatory Visit (INDEPENDENT_AMBULATORY_CARE_PROVIDER_SITE_OTHER): Payer: Medicare Other | Admitting: *Deleted

## 2017-01-11 DIAGNOSIS — Z5181 Encounter for therapeutic drug level monitoring: Secondary | ICD-10-CM | POA: Diagnosis not present

## 2017-01-11 DIAGNOSIS — Z7901 Long term (current) use of anticoagulants: Secondary | ICD-10-CM

## 2017-01-11 DIAGNOSIS — I4891 Unspecified atrial fibrillation: Secondary | ICD-10-CM | POA: Diagnosis not present

## 2017-01-11 LAB — POCT INR: INR: 4

## 2017-01-25 ENCOUNTER — Ambulatory Visit (INDEPENDENT_AMBULATORY_CARE_PROVIDER_SITE_OTHER): Payer: Medicare Other

## 2017-01-25 DIAGNOSIS — I4891 Unspecified atrial fibrillation: Secondary | ICD-10-CM

## 2017-01-25 DIAGNOSIS — Z5181 Encounter for therapeutic drug level monitoring: Secondary | ICD-10-CM

## 2017-01-25 DIAGNOSIS — Z7901 Long term (current) use of anticoagulants: Secondary | ICD-10-CM | POA: Diagnosis not present

## 2017-01-25 LAB — POCT INR: INR: 2.1

## 2017-02-16 ENCOUNTER — Ambulatory Visit (INDEPENDENT_AMBULATORY_CARE_PROVIDER_SITE_OTHER): Payer: Medicare Other | Admitting: Cardiovascular Disease

## 2017-02-16 ENCOUNTER — Encounter: Payer: Self-pay | Admitting: Cardiovascular Disease

## 2017-02-16 VITALS — BP 116/80 | HR 74 | Ht 66.0 in | Wt 136.0 lb

## 2017-02-16 DIAGNOSIS — I482 Chronic atrial fibrillation, unspecified: Secondary | ICD-10-CM

## 2017-02-16 DIAGNOSIS — I5032 Chronic diastolic (congestive) heart failure: Secondary | ICD-10-CM

## 2017-02-16 DIAGNOSIS — I10 Essential (primary) hypertension: Secondary | ICD-10-CM

## 2017-02-16 NOTE — Progress Notes (Signed)
Cardiology Office Note   Date:  02/16/2017   ID:  Latasha, Anderson 01/01/1931, MRN [id number]  PCP:  Jerrol Banana., MD  Cardiologist:   Kathlyn Sacramento, MD   Chief Complaint  Patient presents with  . other    6 month follow up. Patient c/o sob with excertion. Meds reviewed verbally with patient.       History of Present Illness: Latasha Anderson is a 83 y.o. female who presents for a follow-up visit regarding chronic atrial fibrillation and chronic diastolic heart failure.  She has known history of chronic atrial fibrillation on long-term anticoagulation with warfarin, hypertension and hyperlipidemia. She has no history of ischemic heart disease.  Echocardiogram in 06/2015 showed normal LV systolic function, mild mitral regurgitation and moderate pulmonary hypertension with an estimated peak systolic pressure of 51 mmHg with a dilated IVC.  Volume overload improved significantly with small dose furosemide. She is also suspected of having chronic venous insufficiency.  She has been doing well with no chest pain or palpitations. She reports shortness of breath only with significant exertion. No significant leg edema.  Past Medical History:  Diagnosis Date  . Atrial fibrillation [facility])    New diagnosis, asymptomatic, February, 2014  . Breast cancer ([place]) 1985   RT MASTECTOMY  . Chest pain   . Dyslipidemia   . Ejection fraction    EF 55-65%, January, 2012, question diastolic dysfunction  . Hypertension   . Hypokalemia    February, 2014, potassium started  . Mitral regurgitation    Mild, echo, 2012  . Shingles   . Shortness of breath    January, 7341, normal systolic function, question diastolic dysfunction    Past Surgical History:  Procedure Laterality Date  . ABDOMINAL HYSTERECTOMY     age 51  . AUGMENTATION MAMMAPLASTY Right 1985   RT MASTECTOMY FOR BREAST CA  . EYE SURGERY     cataract  . MASTECTOMY Right 1985   reconstruction inplant as well      Current Outpatient Prescriptions  Medication Sig Dispense Refill  . Acetaminophen (TYLENOL PO) Take by mouth as needed.    Marland Kitchen amLODipine-benazepril (LOTREL) 5-40 MG capsule Take 1 capsule by mouth daily. 90 capsule 3  . atorvastatin (LIPITOR) 10 MG tablet TAKE 1 TABLET BY MOUTH EVERY DAY 90 tablet 3  . Biotin 10000 MCG TABS Take 1 tablet by mouth daily.    . cholecalciferol (VITAMIN D) 1000 UNITS tablet Take 1,000 Units by mouth daily.    Marland Kitchen COUMADIN 2.5 MG tablet TAKE AS DIRECTED BY COUMADIN CLINIC 35 tablet 3  . furosemide (LASIX) 20 MG tablet TAKE 1 TABLET (20 MG TOTAL) BY MOUTH DAILY. 90 tablet 3  . latanoprost (XALATAN) 0.005 % ophthalmic solution Place into both eyes daily.     . timolol (TIMOPTIC) 0.5 % ophthalmic solution Place into both eyes daily.      No current facility-administered medications for this visit.     Allergies:   Ciprofloxacin; Cortisone; Nitrofurantoin monohyd macro; and Other    Social History:  The patient  reports that she has never smoked. She has never used smokeless tobacco. She reports that she does not drink alcohol or use drugs.   Family History:  The patient's family history includes Cancer in her brother; Diabetes in her father; Stroke in her mother.      PHYSICAL EXAM: VS:  BP 116/80 (BP Location: Left Arm, Patient Position: Sitting, Cuff Size: Normal)   Pulse 74  Ht 5\' 6"  (1.676 m)   Wt 136 lb (61.7 kg)   BMI 21.95 kg/m  , BMI Body mass index is 21.95 kg/m. GEN: Well nourished, well developed, in no acute distress  HEENT: normal  Neck: no JVD, carotid bruits, or masses Cardiac: Irregularly irregular; no murmurs, rubs, or gallops,no edema  Respiratory:  clear to auscultation bilaterally, normal work of breathing GI: soft, nontender, nondistended, + BS MS: no deformity or atrophy  Skin: warm and dry, no rash Neuro:  Strength and sensation are intact Psych: euthymic mood, full affect   EKG:  EKG is ordered today. The ekg  ordered today demonstrates : Atrial fibrillation with inferolateral T wave changes suggestive of ischemia.   Recent Labs: 06/06/2016: ALT 16; BUN 7; Creatinine, Ser 0.60; Hemoglobin 15.3; Platelets 302; Potassium 4.0; Sodium 141; TSH 3.870    Lipid Panel    Component Value Date/Time   CHOL 163 06/06/2016 0832   TRIG 179 (H) 06/06/2016 0832   HDL 50 06/06/2016 0832   CHOLHDL 3.4 08/24/2015 0912   LDLCALC 77 06/06/2016 0832      Wt Readings from Last 3 Encounters:  02/16/17 136 lb (61.7 kg)  11/24/16 140 lb 9.6 oz (63.8 kg)  08/11/16 141 lb 8 oz (64.2 kg)         ASSESSMENT AND PLAN:  1.  Chronic atrial fibrillation: Ventricular rate is well controlled Without medications. She is tolerating anticoagulation with warfarin with therapeutic INR.  Labs in December of last year were unremarkable.  2.  Chronic diastolic heart failure: She appears to be euvolemic on small dose furosemide  3.  Essential hypertension: Blood pressure is well controlled on current medications.    Disposition:   FU with me  12 months  Signed, Kathlyn Sacramento, MD  02/16/2017 10:55 AM    [place]

## 2017-02-16 NOTE — Patient Instructions (Signed)
Medication Instructions: Continue same medications.   Labwork: None.   Procedures/Testing: None.   Follow-Up: 12 months with Dr. Arida.   Any Additional Special Instructions Will Be Listed Below (If Applicable).     If you need a refill on your cardiac medications before your next appointment, please call your pharmacy.   

## 2017-02-22 ENCOUNTER — Ambulatory Visit (INDEPENDENT_AMBULATORY_CARE_PROVIDER_SITE_OTHER): Payer: Medicare Other | Admitting: *Deleted

## 2017-02-22 DIAGNOSIS — I4891 Unspecified atrial fibrillation: Secondary | ICD-10-CM | POA: Diagnosis not present

## 2017-02-22 DIAGNOSIS — Z5181 Encounter for therapeutic drug level monitoring: Secondary | ICD-10-CM

## 2017-02-22 DIAGNOSIS — Z7901 Long term (current) use of anticoagulants: Secondary | ICD-10-CM | POA: Diagnosis not present

## 2017-02-22 LAB — POCT INR: INR: 2.9

## 2017-03-22 ENCOUNTER — Ambulatory Visit (INDEPENDENT_AMBULATORY_CARE_PROVIDER_SITE_OTHER): Payer: Medicare Other

## 2017-03-22 DIAGNOSIS — I4891 Unspecified atrial fibrillation: Secondary | ICD-10-CM | POA: Diagnosis not present

## 2017-03-22 DIAGNOSIS — Z5181 Encounter for therapeutic drug level monitoring: Secondary | ICD-10-CM

## 2017-03-22 DIAGNOSIS — Z7901 Long term (current) use of anticoagulants: Secondary | ICD-10-CM

## 2017-03-22 LAB — POCT INR: INR: 2.6

## 2017-04-03 ENCOUNTER — Other Ambulatory Visit: Payer: Self-pay | Admitting: Cardiovascular Disease

## 2017-04-03 NOTE — Telephone Encounter (Signed)
Please review for refill, Thanks !  

## 2017-04-20 ENCOUNTER — Other Ambulatory Visit: Payer: Self-pay | Admitting: Family Medicine

## 2017-04-27 ENCOUNTER — Ambulatory Visit (INDEPENDENT_AMBULATORY_CARE_PROVIDER_SITE_OTHER): Payer: Medicare Other | Admitting: Family Medicine

## 2017-04-27 DIAGNOSIS — Z23 Encounter for immunization: Secondary | ICD-10-CM

## 2017-04-29 ENCOUNTER — Ambulatory Visit: Payer: Medicare Other | Admitting: Family Medicine

## 2017-05-10 ENCOUNTER — Ambulatory Visit (INDEPENDENT_AMBULATORY_CARE_PROVIDER_SITE_OTHER): Payer: Medicare Other

## 2017-05-10 DIAGNOSIS — Z5181 Encounter for therapeutic drug level monitoring: Secondary | ICD-10-CM

## 2017-05-10 DIAGNOSIS — Z7901 Long term (current) use of anticoagulants: Secondary | ICD-10-CM | POA: Diagnosis not present

## 2017-05-10 DIAGNOSIS — I4891 Unspecified atrial fibrillation: Secondary | ICD-10-CM

## 2017-05-10 LAB — POCT INR: INR: 2.7

## 2017-05-23 ENCOUNTER — Other Ambulatory Visit: Payer: Self-pay | Admitting: Family Medicine

## 2017-05-23 DIAGNOSIS — Z1231 Encounter for screening mammogram for malignant neoplasm of breast: Secondary | ICD-10-CM

## 2017-06-08 ENCOUNTER — Ambulatory Visit: Payer: Medicare Other | Admitting: Family Medicine

## 2017-06-08 VITALS — BP 118/74 | HR 76 | Temp 97.8°F | Resp 14 | Wt 140.0 lb

## 2017-06-08 DIAGNOSIS — I4821 Permanent atrial fibrillation: Secondary | ICD-10-CM

## 2017-06-08 DIAGNOSIS — E782 Mixed hyperlipidemia: Secondary | ICD-10-CM | POA: Diagnosis not present

## 2017-06-08 DIAGNOSIS — I482 Chronic atrial fibrillation: Secondary | ICD-10-CM | POA: Diagnosis not present

## 2017-06-08 DIAGNOSIS — I10 Essential (primary) hypertension: Secondary | ICD-10-CM

## 2017-06-08 NOTE — Progress Notes (Signed)
Latasha Anderson  MRN: [id number] DOB: 04-Dec-1930  Subjective:  HPI   Patient is here for 6 months follow up. Last office visit was on 11/24/16. HTN: patient does check her b/p sometimes and writes her readings down but she forgot to bring this in today. No chest pain or tightness present. Seen cardiologist for coumadin check ups still. BP Readings from Last 3 Encounters:  06/08/17 118/74  02/16/17 116/80  11/24/16 (!) 146/78   Hyperlipidemia: last lab work was done routine on 06/06/16. Lab Results  Component Value Date   CHOL 163 06/06/2016   HDL 50 06/06/2016   LDLCALC 77 06/06/2016   TRIG 179 (H) 06/06/2016   CHOLHDL 3.4 08/24/2015     Patient Active Problem List   Diagnosis Date Noted  . Chronic atrial fibrillation ([place]) 05/01/2015  . Atrophic vaginitis 12/15/2014  . Breast CA ([place]) 12/15/2014  . Decrease in the ability to hear 12/15/2014  . Essential (primary) hypertension 12/15/2014  . Fatigue 12/15/2014  . Acid reflux 12/15/2014  . Glaucoma 12/15/2014  . Flu vaccine need 12/15/2014  . Blood glucose elevated 12/15/2014  . Arthritis, degenerative 12/15/2014  . OP (osteoporosis) 12/15/2014  . HLD (hyperlipidemia) 12/15/2014  . Pain of upper abdomen 12/15/2014  . Encounter for therapeutic drug monitoring 09/04/2013  . Preop cardiovascular exam 03/07/2013  . Hypokalemia   . Long term (current) use of anticoagulants 08/22/2012  . Atrial fibrillation ([place])   . Dyslipidemia   . Hypertension   . Breast cancer ([place])   . Shortness of breath   . Chest pain   . Ejection fraction   . Mitral regurgitation   . SHINGLES 07/26/2010    Past Medical History:  Diagnosis Date  . Atrial fibrillation [facility])    New diagnosis, asymptomatic, February, 2014  . Breast cancer ([place]) 1985   RT MASTECTOMY  . Chest pain   . Dyslipidemia   . Ejection fraction    EF 55-65%, January, 2012, question diastolic dysfunction  . Hypertension   . Hypokalemia    February, 2014,  potassium started  . Mitral regurgitation    Mild, echo, 2012  . Shingles   . Shortness of breath    January, 6761, normal systolic function, question diastolic dysfunction    Social History   Socioeconomic History  . Marital status: Married    Spouse name: Not on file  . Number of children: Not on file  . Years of education: Not on file  . Highest education level: Not on file  Social Needs  . Financial resource strain: Not on file  . Food insecurity - worry: Not on file  . Food insecurity - inability: Not on file  . Transportation needs - medical: Not on file  . Transportation needs - non-medical: Not on file  Occupational History  . Not on file  Tobacco Use  . Smoking status: Never Smoker  . Smokeless tobacco: Never Used  Substance and Sexual Activity  . Alcohol use: No    Alcohol/week: 0.0 - 0.6 oz    Comment: red wine  . Drug use: No  . Sexual activity: No  Other Topics Concern  . Not on file  Social History Narrative  . Not on file    Outpatient Encounter Medications as of 06/08/2017  Medication Sig  . Acetaminophen (TYLENOL PO) Take by mouth as needed.  Marland Kitchen amLODipine-benazepril (LOTREL) 5-40 MG capsule Take 1 capsule by mouth daily.  Marland Kitchen atorvastatin (LIPITOR) 10 MG tablet TAKE 1 TABLET BY  MOUTH EVERY DAY  . Biotin 10000 MCG TABS Take 1 tablet by mouth daily.  . cholecalciferol (VITAMIN D) 1000 UNITS tablet Take 1,000 Units by mouth daily.  Marland Kitchen COUMADIN 2.5 MG tablet TAKE AS DIRECTED BY COUMADIN CLINIC  . furosemide (LASIX) 20 MG tablet TAKE 1 TABLET (20 MG TOTAL) BY MOUTH DAILY.  Marland Kitchen latanoprost (XALATAN) 0.005 % ophthalmic solution Place into both eyes daily.   . timolol (TIMOPTIC) 0.5 % ophthalmic solution Place into both eyes daily.    No facility-administered encounter medications on file as of 06/08/2017.     Allergies  Allergen Reactions  . Ciprofloxacin Nausea And Vomiting  . Cortisone     Pt stated this makes her turn blue  . Nitrofurantoin Monohyd  Macro     GI upset & chills.  . Other     Mycins    Review of Systems  Constitutional: Negative.   Respiratory: Negative.   Cardiovascular: Negative.   Gastrointestinal: Negative.   Musculoskeletal: Positive for joint pain (left hip.). Negative for falls.  Neurological: Positive for dizziness (sometimes with positional change.).    Objective:  BP 118/74   Pulse 76   Temp 97.8 F (36.6 C)   Resp 14   Wt 140 lb (63.5 kg)   BMI 22.60 kg/m   Physical Exam  Constitutional: She is oriented to person, place, and time and well-developed, well-nourished, and in no distress.  HENT:  Head: Normocephalic and atraumatic.  Eyes: Conjunctivae are normal. Pupils are equal, round, and reactive to light.  Neck: Normal range of motion. Neck supple.  Cardiovascular: Normal rate, regular rhythm, normal heart sounds and intact distal pulses. Exam reveals no gallop.  No murmur heard. Pulmonary/Chest: Effort normal and breath sounds normal. No respiratory distress. She has no wheezes.  Musculoskeletal: She exhibits no edema or tenderness.  Neurological: She is alert and oriented to person, place, and time.    Assessment and Plan :  1. Essential (primary) hypertension Stable. Continue current medication. - CBC with Differential/Platelet - Comprehensive metabolic panel - TSH  2. Mixed hyperlipidemia Check lab work. - Comprehensive metabolic panel - TSH - Lipid Profile  3. Permanent atrial fibrillation (HCC) - TSH 4.h/o Breast Cancer  HPI, Exam and A&P transcribed by Tiffany Kocher, RMA under direction and in the presence of Miguel Aschoff, MD. I have done the exam and reviewed the chart and it is accurate to the best of my knowledge. Development worker, community has been used and  any errors in dictation or transcription are unintentional. Miguel Aschoff M.D. [facility]

## 2017-06-16 ENCOUNTER — Encounter: Payer: Self-pay | Admitting: Family Medicine

## 2017-06-19 LAB — COMPLETE METABOLIC PANEL WITH GFR
AG RATIO: 1.9 (calc) (ref 1.0–2.5)
ALBUMIN MSPROF: 4.1 g/dL (ref 3.6–5.1)
ALKALINE PHOSPHATASE (APISO): 55 U/L (ref 33–130)
ALT: 13 U/L (ref 6–29)
AST: 17 U/L (ref 10–35)
BUN: 8 mg/dL (ref 7–25)
CO2: 36 mmol/L — ABNORMAL HIGH (ref 20–32)
Calcium: 9.6 mg/dL (ref 8.6–10.4)
Chloride: 102 mmol/L (ref 98–110)
Creat: 0.62 mg/dL (ref 0.60–0.88)
GFR, EST NON AFRICAN AMERICAN: 83 mL/min/{1.73_m2} (ref >=60)
GFR, Est African American: 96 mL/min/{1.73_m2} (ref >=60)
GLOBULIN: 2.2 g/dL (ref 1.9–3.7)
Glucose, Bld: 104 mg/dL — ABNORMAL HIGH (ref 65–99)
POTASSIUM: 3.7 mmol/L (ref 3.5–5.3)
SODIUM: 142 mmol/L (ref 135–146)
Total Bilirubin: 1.2 mg/dL (ref 0.2–1.2)
Total Protein: 6.3 g/dL (ref 6.1–8.1)

## 2017-06-19 LAB — LIPID PANEL
CHOL/HDL RATIO: 3.3 (calc) (ref <5.0)
CHOLESTEROL: 167 mg/dL (ref <200)
HDL: 51 mg/dL (ref >50)
LDL CHOLESTEROL (CALC): 89 mg/dL
Non-HDL Cholesterol (Calc): 116 mg/dL (calc) (ref <130)
Triglycerides: 163 mg/dL — ABNORMAL HIGH (ref <150)

## 2017-06-19 LAB — CBC WITH DIFFERENTIAL/PLATELET
Basophils Absolute: 38 cells/uL (ref 0–200)
Basophils Relative: 0.5 %
EOS ABS: 289 {cells}/uL (ref 15–500)
Eosinophils Relative: 3.8 %
HCT: 44.1 % (ref 35.0–45.0)
HEMOGLOBIN: 15.2 g/dL (ref 11.7–15.5)
Lymphs Abs: 2288 cells/uL (ref 850–3900)
MCH: 32.1 pg (ref 27.0–33.0)
MCHC: 34.5 g/dL (ref 32.0–36.0)
MCV: 93 fL (ref 80.0–100.0)
MPV: 10.6 fL (ref 7.5–12.5)
Monocytes Relative: 8.9 %
NEUTROS ABS: 4309 {cells}/uL (ref 1500–7800)
Neutrophils Relative %: 56.7 %
Platelets: 259 10*3/uL (ref 140–400)
RBC: 4.74 10*6/uL (ref 3.80–5.10)
RDW: 11.7 % (ref 11.0–15.0)
Total Lymphocyte: 30.1 %
WBC: 7.6 10*3/uL (ref 3.8–10.8)
WBCMIX: 676 {cells}/uL (ref 200–950)

## 2017-06-19 LAB — TSH: TSH: 3.19 mIU/L (ref 0.40–4.50)

## 2017-06-20 ENCOUNTER — Telehealth: Payer: Self-pay

## 2017-06-20 NOTE — Telephone Encounter (Signed)
Patient advised as below.  

## 2017-06-20 NOTE — Telephone Encounter (Signed)
-----   Message from Jerrol Banana., MD sent at 06/20/2017  9:19 AM EST ----- Labs OK.

## 2017-06-21 ENCOUNTER — Ambulatory Visit (INDEPENDENT_AMBULATORY_CARE_PROVIDER_SITE_OTHER): Payer: Medicare Other

## 2017-06-21 DIAGNOSIS — Z5181 Encounter for therapeutic drug level monitoring: Secondary | ICD-10-CM | POA: Diagnosis not present

## 2017-06-21 DIAGNOSIS — I4891 Unspecified atrial fibrillation: Secondary | ICD-10-CM | POA: Diagnosis not present

## 2017-06-21 DIAGNOSIS — Z7901 Long term (current) use of anticoagulants: Secondary | ICD-10-CM

## 2017-06-21 LAB — POCT INR: INR: 2.7

## 2017-06-21 NOTE — Patient Instructions (Signed)
Continue taking same dosage 1 tablet everyday except 1/2 tablet on Wednesdays.  Recheck in 6 weeks. 

## 2017-07-03 ENCOUNTER — Ambulatory Visit
Admission: RE | Admit: 2017-07-03 | Discharge: 2017-07-03 | Disposition: A | Discharge status: Home / Self Care | Payer: Medicare Other | Source: Ambulatory Visit | Attending: Family Medicine | Admitting: Family Medicine

## 2017-07-03 DIAGNOSIS — Z9011 Acquired absence of right breast and nipple: Secondary | ICD-10-CM | POA: Insufficient documentation

## 2017-07-03 DIAGNOSIS — Z1231 Encounter for screening mammogram for malignant neoplasm of breast: Secondary | ICD-10-CM | POA: Insufficient documentation

## 2017-08-02 ENCOUNTER — Ambulatory Visit (INDEPENDENT_AMBULATORY_CARE_PROVIDER_SITE_OTHER): Payer: Medicare Other

## 2017-08-02 DIAGNOSIS — Z5181 Encounter for therapeutic drug level monitoring: Secondary | ICD-10-CM | POA: Diagnosis not present

## 2017-08-02 DIAGNOSIS — Z7901 Long term (current) use of anticoagulants: Secondary | ICD-10-CM

## 2017-08-02 DIAGNOSIS — I4891 Unspecified atrial fibrillation: Secondary | ICD-10-CM

## 2017-08-02 LAB — POCT INR: INR: 2.4

## 2017-08-02 NOTE — Patient Instructions (Signed)
Continue taking same dosage 1 tablet everyday except 1/2 tablet on Wednesdays.  Recheck in 6 weeks. 

## 2017-08-05 ENCOUNTER — Other Ambulatory Visit: Payer: Self-pay | Admitting: Cardiovascular Disease

## 2017-08-07 NOTE — Telephone Encounter (Signed)
Refill Request.  

## 2017-09-13 ENCOUNTER — Ambulatory Visit (INDEPENDENT_AMBULATORY_CARE_PROVIDER_SITE_OTHER): Payer: Medicare Other

## 2017-09-13 DIAGNOSIS — Z7901 Long term (current) use of anticoagulants: Secondary | ICD-10-CM | POA: Diagnosis not present

## 2017-09-13 DIAGNOSIS — I4891 Unspecified atrial fibrillation: Secondary | ICD-10-CM

## 2017-09-13 DIAGNOSIS — Z5181 Encounter for therapeutic drug level monitoring: Secondary | ICD-10-CM

## 2017-09-13 LAB — POCT INR: INR: 2.7

## 2017-09-13 NOTE — Patient Instructions (Signed)
Continue taking same dosage 1 tablet everyday except 1/2 tablet on Wednesdays.  Recheck in 6 weeks. 

## 2017-10-11 ENCOUNTER — Other Ambulatory Visit: Payer: Self-pay | Admitting: Cardiovascular Disease

## 2017-10-25 ENCOUNTER — Ambulatory Visit (INDEPENDENT_AMBULATORY_CARE_PROVIDER_SITE_OTHER): Payer: Medicare Other

## 2017-10-25 DIAGNOSIS — Z7901 Long term (current) use of anticoagulants: Secondary | ICD-10-CM

## 2017-10-25 DIAGNOSIS — I4891 Unspecified atrial fibrillation: Secondary | ICD-10-CM | POA: Diagnosis not present

## 2017-10-25 DIAGNOSIS — Z5181 Encounter for therapeutic drug level monitoring: Secondary | ICD-10-CM | POA: Diagnosis not present

## 2017-10-25 LAB — POCT INR: INR: 2.6

## 2017-10-25 NOTE — Patient Instructions (Signed)
Continue taking same dosage 1 tablet everyday except 1/2 tablet on Wednesdays.  Recheck in 6 weeks. 

## 2017-11-02 ENCOUNTER — Other Ambulatory Visit: Payer: Self-pay | Admitting: Cardiovascular Disease

## 2017-11-25 ENCOUNTER — Other Ambulatory Visit: Payer: Self-pay | Admitting: Cardiovascular Disease

## 2017-11-28 NOTE — Telephone Encounter (Signed)
Please review for refill, Thanks !  

## 2017-12-06 ENCOUNTER — Ambulatory Visit (INDEPENDENT_AMBULATORY_CARE_PROVIDER_SITE_OTHER): Payer: Medicare Other

## 2017-12-06 DIAGNOSIS — I4891 Unspecified atrial fibrillation: Secondary | ICD-10-CM

## 2017-12-06 DIAGNOSIS — Z7901 Long term (current) use of anticoagulants: Secondary | ICD-10-CM

## 2017-12-06 DIAGNOSIS — Z5181 Encounter for therapeutic drug level monitoring: Secondary | ICD-10-CM

## 2017-12-06 LAB — POCT INR: INR: 2.1 (ref 2.0–3.0)

## 2017-12-06 NOTE — Patient Instructions (Signed)
Continue taking same dosage 1 tablet everyday except 1/2 tablet on Wednesdays.  Recheck in 6 weeks. 

## 2017-12-07 ENCOUNTER — Ambulatory Visit: Payer: Self-pay | Admitting: Family Medicine

## 2017-12-18 ENCOUNTER — Ambulatory Visit: Payer: Self-pay

## 2017-12-18 ENCOUNTER — Encounter: Payer: Self-pay | Admitting: Family Medicine

## 2018-01-17 ENCOUNTER — Ambulatory Visit: Payer: Medicare Other

## 2018-01-17 DIAGNOSIS — Z5181 Encounter for therapeutic drug level monitoring: Secondary | ICD-10-CM | POA: Diagnosis not present

## 2018-01-17 DIAGNOSIS — Z7901 Long term (current) use of anticoagulants: Secondary | ICD-10-CM | POA: Diagnosis not present

## 2018-01-17 DIAGNOSIS — I4891 Unspecified atrial fibrillation: Secondary | ICD-10-CM

## 2018-01-17 LAB — POCT INR: INR: 2.4 (ref 2.0–3.0)

## 2018-01-17 NOTE — Patient Instructions (Signed)
Continue taking same dosage 1 tablet everyday except 1/2 tablet on Wednesdays.  Recheck in 6 weeks. 

## 2018-01-28 ENCOUNTER — Other Ambulatory Visit: Payer: Self-pay | Admitting: Cardiovascular Disease

## 2018-01-29 ENCOUNTER — Telehealth: Payer: Self-pay | Admitting: Cardiovascular Disease

## 2018-01-29 NOTE — Telephone Encounter (Signed)
Attempted to call patient and schedule appointment But number was going busy   Will try again at a later time

## 2018-01-29 NOTE — Telephone Encounter (Signed)
-----   Message from Janan Ridge, [place] sent at 01/29/2018 10:59 AM EDT ----- Regarding: Patient needs an appointment Can you please try to contact patient follow up appointment. Patient was last seen 02/2017.   Thanks!

## 2018-01-30 NOTE — Telephone Encounter (Signed)
Pt scheduled 03/29/18

## 2018-02-25 ENCOUNTER — Other Ambulatory Visit: Payer: Self-pay | Admitting: Cardiovascular Disease

## 2018-02-26 NOTE — Telephone Encounter (Signed)
Please review for refill, Thanks !  

## 2018-02-28 ENCOUNTER — Ambulatory Visit: Payer: Medicare Other

## 2018-02-28 DIAGNOSIS — Z5181 Encounter for therapeutic drug level monitoring: Secondary | ICD-10-CM | POA: Diagnosis not present

## 2018-02-28 DIAGNOSIS — I4891 Unspecified atrial fibrillation: Secondary | ICD-10-CM | POA: Diagnosis not present

## 2018-02-28 DIAGNOSIS — Z7901 Long term (current) use of anticoagulants: Secondary | ICD-10-CM

## 2018-02-28 LAB — POCT INR: INR: 2.6 (ref 2.0–3.0)

## 2018-02-28 NOTE — Patient Instructions (Signed)
Continue taking same dosage 1 tablet everyday except 1/2 tablet on Wednesdays.  Recheck in 6 weeks. 

## 2018-03-01 ENCOUNTER — Ambulatory Visit (INDEPENDENT_AMBULATORY_CARE_PROVIDER_SITE_OTHER): Payer: Medicare Other | Admitting: Family Medicine

## 2018-03-01 ENCOUNTER — Other Ambulatory Visit: Payer: Self-pay | Admitting: Cardiovascular Disease

## 2018-03-01 ENCOUNTER — Ambulatory Visit (INDEPENDENT_AMBULATORY_CARE_PROVIDER_SITE_OTHER): Payer: Medicare Other

## 2018-03-01 VITALS — BP 132/72 | HR 83 | Temp 97.9°F | Ht 66.0 in | Wt 135.4 lb

## 2018-03-01 VITALS — BP 132/72 | HR 83 | Temp 97.9°F | Resp 16 | Wt 135.0 lb

## 2018-03-01 DIAGNOSIS — I10 Essential (primary) hypertension: Secondary | ICD-10-CM | POA: Diagnosis not present

## 2018-03-01 DIAGNOSIS — E782 Mixed hyperlipidemia: Secondary | ICD-10-CM | POA: Diagnosis not present

## 2018-03-01 DIAGNOSIS — H9193 Unspecified hearing loss, bilateral: Secondary | ICD-10-CM

## 2018-03-01 DIAGNOSIS — M810 Age-related osteoporosis without current pathological fracture: Secondary | ICD-10-CM

## 2018-03-01 DIAGNOSIS — Z0000 Encounter for general adult medical examination without abnormal findings: Secondary | ICD-10-CM

## 2018-03-01 DIAGNOSIS — I4891 Unspecified atrial fibrillation: Secondary | ICD-10-CM | POA: Diagnosis not present

## 2018-03-01 NOTE — Progress Notes (Signed)
Subjective:   Latasha Anderson is a 84 y.o. female who presents for Medicare Annual (Subsequent) preventive examination.  Review of Systems:  N/A  Cardiac Risk Factors include: advanced age (>62men, >47 women);dyslipidemia;hypertension     Objective:     Vitals: BP 132/72 (BP Location: Right Arm)   Pulse 83   Temp 97.9 F (36.6 C) (Oral)   Ht 5\' 6"  (1.676 m)   Wt 135 lb 6.4 oz (61.4 kg)   BMI 21.85 kg/m   Body mass index is 21.85 kg/m.  Advanced Directives 03/01/2018 11/24/2016 12/24/2014  Does Patient Have a Medical Advance Directive? No No No  Would patient like information on creating a medical advance directive? No - Patient declined Yes (ED - Information included in AVS) -    Tobacco Social History   Tobacco Use  Smoking Status Never Smoker  Smokeless Tobacco Never Used     Counseling given: Not Answered   Clinical Intake:  Pre-visit preparation completed: Yes  Pain : No/denies pain Pain Score: 0-No pain     Nutritional Status: BMI of 19-24  Normal Nutritional Risks: None Diabetes: No  How often do you need to have someone help you when you read instructions, pamphlets, or other written materials from your doctor or pharmacy?: 1 - Never  Interpreter Needed?: No  Information entered by :: [facility], LPN  Past Medical History:  Diagnosis Date  . Atrial fibrillation [facility])    New diagnosis, asymptomatic, February, 2014  . Breast cancer ([place]) 1985   RT MASTECTOMY  . Chest pain   . Dyslipidemia   . Ejection fraction    EF 55-65%, January, 2012, question diastolic dysfunction  . Hypertension   . Hypokalemia    February, 2014, potassium started  . Mitral regurgitation    Mild, echo, 2012  . Shingles   . Shortness of breath    January, 3810, normal systolic function, question diastolic dysfunction   Past Surgical History:  Procedure Laterality Date  . ABDOMINAL HYSTERECTOMY     age 89  . AUGMENTATION MAMMAPLASTY Right 1985   RT MASTECTOMY  FOR BREAST CA  . EYE SURGERY     cataract  . MASTECTOMY Right 1985   reconstruction inplant as well   Family History  Problem Relation Age of Onset  . Stroke Mother   . Diabetes Father   . Cancer Brother        prostate  . Breast cancer Neg Hx    Social History   Socioeconomic History  . Marital status: Married    Spouse name: Not on file  . Number of children: 1  . Years of education: Not on file  . Highest education level: Bachelor's degree (e.g., BA, AB, BS)  Occupational History  . Not on file  Social Needs  . Financial resource strain: Not hard at all  . Food insecurity:    Worry: Never true    Inability: Never true  . Transportation needs:    Medical: No    Non-medical: No  Tobacco Use  . Smoking status: Never Smoker  . Smokeless tobacco: Never Used  Substance and Sexual Activity  . Alcohol use: No    Alcohol/week: 0.0 standard drinks  . Drug use: No  . Sexual activity: Never  Lifestyle  . Physical activity:    Days per week: Not on file    Minutes per session: Not on file  . Stress: Only a little  Relationships  . Social connections:  Talks on phone: Not on file    Gets together: Not on file    Attends religious service: Not on file    Active member of club or organization: Not on file    Attends meetings of clubs or organizations: Not on file    Relationship status: Not on file  Other Topics Concern  . Not on file  Social History Narrative  . Not on file    Outpatient Encounter Medications as of 03/01/2018  Medication Sig  . Acetaminophen (TYLENOL PO) Take by mouth as needed.  Marland Kitchen amLODipine-benazepril (LOTREL) 5-40 MG capsule TAKE 1 CAPSULE BY MOUTH DAILY.  Marland Kitchen atorvastatin (LIPITOR) 10 MG tablet TAKE 1 TABLET BY MOUTH EVERY DAY  . cholecalciferol (VITAMIN D) 1000 UNITS tablet Take 1,000 Units by mouth daily.  Marland Kitchen COUMADIN 2.5 MG tablet TAKE AS DIRECTED BY COUMADIN CLINIC  . furosemide (LASIX) 20 MG tablet TAKE 1 TABLET (20 MG TOTAL) BY MOUTH  DAILY.  Marland Kitchen latanoprost (XALATAN) 0.005 % ophthalmic solution Place into both eyes daily.   . timolol (TIMOPTIC) 0.5 % ophthalmic solution Place into both eyes daily.   . Biotin 10000 MCG TABS Take 1 tablet by mouth daily.   No facility-administered encounter medications on file as of 03/01/2018.     Activities of Daily Living In your present state of health, do you have any difficulty performing the following activities: 03/01/2018  Hearing? Y  Comment Does not wear hearing aids.   Vision? N  Difficulty concentrating or making decisions? N  Walking or climbing stairs? Y  Comment Do to SOB when climbing steps.   Dressing or bathing? N  Doing errands, shopping? N  Preparing Food and eating ? N  Using the Toilet? N  In the past six months, have you accidently leaked urine? N  Do you have problems with loss of bowel control? N  Managing your Medications? N  Managing your Finances? N  Housekeeping or managing your Housekeeping? N  Some recent data might be hidden    Patient Care Team: Jerrol Banana., MD as PCP - General (Unknown Physician Specialty) Estill Cotta, MD as Consulting Physician (Ophthalmology) Wellington Hampshire, MD as Consulting Physician (Cardiology)    Assessment:   This is a routine wellness examination for Latasha Anderson.  Exercise Activities and Dietary recommendations Current Exercise Habits: Home exercise routine, Type of exercise: walking, Time (Minutes): 30, Frequency (Times/Week): 2, Weekly Exercise (Minutes/Week): 60, Intensity: Mild, Exercise limited by: None identified  Goals    . Increase water intake     Recommend increasing water intake to 4-6 glasses a day.       Fall Risk Fall Risk  03/01/2018 11/24/2016 02/12/2015  Falls in the past year? No No No   Is the patient's home free of loose throw rugs in walkways, pet beds, electrical cords, etc?   yes      Grab bars in the bathroom? yes      Handrails on the stairs?   no      Adequate lighting?    yes  Timed Get Up and Go performed: N/A  Depression Screen PHQ 2/9 Scores 03/01/2018 03/01/2018 11/24/2016 11/24/2016  PHQ - 2 Score 0 0 0 0  PHQ- 9 Score 0 - 3 -     Cognitive Function: Pt declined screening today.     6CIT Screen 11/24/2016  What Year? 0 points  What month? 0 points  What time? 0 points  Count back from 20 0 points  Months in reverse 0 points  Repeat phrase 2 points  Total Score 2    Immunization History  Administered Date(s) Administered  . Influenza, High Dose Seasonal PF 04/23/2015, 04/28/2016, 04/27/2017  . Pneumococcal Conjugate-13 01/16/2014  . Pneumococcal Polysaccharide-23 05/13/2006  . Td 10/13/2003  . Tdap 01/16/2014    Qualifies for Shingles Vaccine? Due for Shingles vaccine. Declined my offer to administer today. Education has been provided regarding the importance of this vaccine. Pt has been advised to call her insurance company to determine her out of pocket expense. Advised she may also receive this vaccine at her local pharmacy or Health Dept. Verbalized acceptance and understanding.  Screening Tests Health Maintenance  Topic Date Due  . INFLUENZA VACCINE  02/01/2018  . TETANUS/TDAP  01/17/2024  . DEXA SCAN  Completed  . PNA vac Low Risk Adult  Completed    Cancer Screenings: Lung: Low Dose CT Chest recommended if Age 1-80 years, 30 pack-year currently smoking OR have quit w/in 15years. Patient does not qualify. Breast:  Up to date on Mammogram? Yes   Up to date of Bone Density/Dexa? Yes Colorectal: Up to date  Additional Screenings:  Hepatitis C Screening: N/A     Plan:  I have personally reviewed and addressed the Medicare Annual Wellness questionnaire and have noted the following in the patient's chart:  A. Medical and social history B. Use of alcohol, tobacco or illicit drugs  C. Current medications and supplements D. Functional ability and status E.  Nutritional status F.  Physical activity G. Advance  directives H. List of other physicians I.  Hospitalizations, surgeries, and ER visits in previous 12 months J.  [place] such as hearing and vision if needed, cognitive and depression L. Referrals and appointments - none  In addition, I have reviewed and discussed with patient certain preventive protocols, quality metrics, and best practice recommendations. A written personalized care plan for preventive services as well as general preventive health recommendations were provided to patient.  See attached scanned questionnaire for additional information.   Signed,  Fabio Neighbors, LPN Nurse Health Advisor   Nurse Recommendations: None.

## 2018-03-01 NOTE — Progress Notes (Signed)
Patient: Latasha Anderson, Female    DOB: 24-Jan-1931, 84 y.o.   MRN: [id number] Visit Date: 03/01/2018  Today's Provider: Wilhemena Durie, MD   Chief Complaint  Patient presents with  . Annual Wellness Exam  . Hypertension  . Hearing Loss   Subjective:    Annual wellness visit Latasha Anderson is a 84 y.o. female. She feels well. She reports exercising not regularly, but she does stay active. She reports she is sleeping well.  Colonoscopy- 2012. Diverticulosis. Otherwise normal.  Mammogram- 07/03/2017. Normal. BMD- 2002. Normal. Tdap- 01/16/2014.    Hypertension, follow-up:  BP Readings from Last 3 Encounters:  03/01/18 132/72  03/01/18 132/72  06/08/17 118/74    She was last seen for hypertension 6 months ago.  BP at that visit was 118/74. Management since that visit includes no changes. She reports good compliance with treatment. She is not having side effects.  She is not exercising. She is adherent to low salt diet.   Outside blood pressures are checked occasionally. She is experiencing none.  Patient denies exertional chest pressure/discomfort, lower extremity edema and palpitations.   Cardiovascular risk factors include dyslipidemia.   Weight trend: stable Wt Readings from Last 3 Encounters:  03/01/18 135 lb (61.2 kg)  03/01/18 135 lb 6.4 oz (61.4 kg)  06/08/17 140 lb (63.5 kg)   Current diet: well balanced  Hearing loss: Patient reports that her hearing is worsening. She is requesting a referral to get a hearing aid.  Review of Systems  Constitutional: Negative for activity change, appetite change, chills, diaphoresis, fatigue, fever and unexpected weight change.  HENT: Positive for hearing loss.   Eyes: Negative.   Respiratory: Negative.   Cardiovascular: Negative.   Gastrointestinal: Negative.   Endocrine: Negative.   Genitourinary: Negative.   Musculoskeletal: Negative.   Allergic/Immunologic: Negative.   Neurological:  Negative.   Hematological: Negative.   Psychiatric/Behavioral: Negative.     Social History   Socioeconomic History  . Marital status: Married    Spouse name: Not on file  . Number of children: 1  . Years of education: Not on file  . Highest education level: Bachelor's degree (e.g., BA, AB, BS)  Occupational History  . Not on file  Social Needs  . Financial resource strain: Not hard at all  . Food insecurity:    Worry: Never true    Inability: Never true  . Transportation needs:    Medical: No    Non-medical: No  Tobacco Use  . Smoking status: Never Smoker  . Smokeless tobacco: Never Used  Substance and Sexual Activity  . Alcohol use: No    Alcohol/week: 0.0 standard drinks  . Drug use: No  . Sexual activity: Never  Lifestyle  . Physical activity:    Days per week: Not on file    Minutes per session: Not on file  . Stress: Only a little  Relationships  . Social connections:    Talks on phone: Not on file    Gets together: Not on file    Attends religious service: Not on file    Active member of club or organization: Not on file    Attends meetings of clubs or organizations: Not on file    Relationship status: Not on file  . Intimate partner violence:    Fear of current or ex partner: Not on file    Emotionally abused: Not on file    Physically abused: Not on file  Forced sexual activity: Not on file  Other Topics Concern  . Not on file  Social History Narrative  . Not on file    Past Medical History:  Diagnosis Date  . Atrial fibrillation [facility])    New diagnosis, asymptomatic, February, 2014  . Breast cancer ([place]) 1985   RT MASTECTOMY  . Chest pain   . Dyslipidemia   . Ejection fraction    EF 55-65%, January, 2012, question diastolic dysfunction  . Hypertension   . Hypokalemia    February, 2014, potassium started  . Mitral regurgitation    Mild, echo, 2012  . Shingles   . Shortness of breath    January, 6283, normal systolic function, question  diastolic dysfunction     Patient Active Problem List   Diagnosis Date Noted  . Chronic atrial fibrillation ([place]) 05/01/2015  . Atrophic vaginitis 12/15/2014  . Breast CA ([place]) 12/15/2014  . Decrease in the ability to hear 12/15/2014  . Essential (primary) hypertension 12/15/2014  . Fatigue 12/15/2014  . Acid reflux 12/15/2014  . Glaucoma 12/15/2014  . Flu vaccine need 12/15/2014  . Blood glucose elevated 12/15/2014  . Arthritis, degenerative 12/15/2014  . OP (osteoporosis) 12/15/2014  . HLD (hyperlipidemia) 12/15/2014  . Pain of upper abdomen 12/15/2014  . Encounter for therapeutic drug monitoring 09/04/2013  . Preop cardiovascular exam 03/07/2013  . Hypokalemia   . Long term (current) use of anticoagulants 08/22/2012  . Atrial fibrillation ([place])   . Dyslipidemia   . Hypertension   . Breast cancer ([place])   . Shortness of breath   . Chest pain   . Ejection fraction   . Mitral regurgitation   . SHINGLES 07/26/2010    Past Surgical History:  Procedure Laterality Date  . ABDOMINAL HYSTERECTOMY     age 19  . AUGMENTATION MAMMAPLASTY Right 1985   RT MASTECTOMY FOR BREAST CA  . EYE SURGERY     cataract  . MASTECTOMY Right 1985   reconstruction inplant as well    Her family history includes Cancer in her brother; Diabetes in her father; Stroke in her mother. There is no history of Breast cancer.      Current Outpatient Medications:  .  Acetaminophen (TYLENOL PO), Take by mouth as needed., Disp: , Rfl:  .  amLODipine-benazepril (LOTREL) 5-40 MG capsule, TAKE 1 CAPSULE BY MOUTH DAILY., Disp: 30 capsule, Rfl: 0 .  atorvastatin (LIPITOR) 10 MG tablet, TAKE 1 TABLET BY MOUTH EVERY DAY, Disp: 90 tablet, Rfl: 3 .  Biotin 10000 MCG TABS, Take 1 tablet by mouth daily., Disp: , Rfl:  .  cholecalciferol (VITAMIN D) 1000 UNITS tablet, Take 1,000 Units by mouth daily., Disp: , Rfl:  .  COUMADIN 2.5 MG tablet, TAKE AS DIRECTED BY COUMADIN CLINIC, Disp: 105 tablet, Rfl: 0 .   furosemide (LASIX) 20 MG tablet, TAKE 1 TABLET (20 MG TOTAL) BY MOUTH DAILY., Disp: 90 tablet, Rfl: 3 .  latanoprost (XALATAN) 0.005 % ophthalmic solution, Place into both eyes daily. , Disp: , Rfl:  .  timolol (TIMOPTIC) 0.5 % ophthalmic solution, Place into both eyes daily. , Disp: , Rfl:   Patient Care Team: Jerrol Banana., MD as PCP - General (Unknown Physician Specialty) Estill Cotta, MD as Consulting Physician (Ophthalmology) Wellington Hampshire, MD as Consulting Physician (Cardiology)     Objective:   Vitals: BP 132/72   Pulse 83   Temp 97.9 F (36.6 C)   Resp 16   Wt 135 lb (61.2 kg)  BMI 21.79 kg/m   Physical Exam  Constitutional: She is oriented to person, place, and time. She appears well-developed and well-nourished.  HENT:  Head: Normocephalic and atraumatic.  Right Ear: External ear normal.  Left Ear: External ear normal.  Nose: Nose normal.  Eyes: Conjunctivae are normal. No scleral icterus.  Neck: No thyromegaly present.  Cardiovascular: Normal rate, regular rhythm and normal heart sounds.  Pulmonary/Chest: Effort normal and breath sounds normal.  Abdominal: Soft.  Musculoskeletal: She exhibits edema.  1+ edema.  Neurological: She is alert and oriented to person, place, and time.  Skin: Skin is warm and dry.  Psychiatric: She has a normal mood and affect. Her behavior is normal. Judgment and thought content normal.    Activities of Daily Living In your present state of health, do you have any difficulty performing the following activities: 03/01/2018  Hearing? Y  Comment Does not wear hearing aids.   Vision? N  Difficulty concentrating or making decisions? N  Walking or climbing stairs? Y  Comment Do to SOB when climbing steps.   Dressing or bathing? N  Doing errands, shopping? N  Preparing Food and eating ? N  Using the Toilet? N  In the past six months, have you accidently leaked urine? N  Do you have problems with loss of bowel  control? N  Managing your Medications? N  Managing your Finances? N  Housekeeping or managing your Housekeeping? N  Some recent data might be hidden    Fall Risk Assessment Fall Risk  03/01/2018 11/24/2016 02/12/2015  Falls in the past year? No No No     Depression Screen PHQ 2/9 Scores 03/01/2018 03/01/2018 11/24/2016 11/24/2016  PHQ - 2 Score 0 0 0 0  PHQ- 9 Score 0 - 3 -    Cognitive Testing - 6-CIT 6CIT Screen 11/24/2016  What Year? 0 points  What month? 0 points  What time? 0 points  Count back from 20 0 points  Months in reverse 0 points  Repeat phrase 2 points  Total Score 2       Assessment & Plan:     Annual Wellness Visit  Reviewed patient's Family Medical History Reviewed and updated list of patient's medical providers Assessment of cognitive impairment was done Assessed patient's functional ability Established a written schedule for health screening [place] Completed and Reviewed  Exercise Activities and Dietary recommendations Goals    . Increase water intake     Recommend increasing water intake to 4-6 glasses a day.       Immunization History  Administered Date(s) Administered  . Influenza, High Dose Seasonal PF 04/23/2015, 04/28/2016, 04/27/2017  . Pneumococcal Conjugate-13 01/16/2014  . Pneumococcal Polysaccharide-23 05/13/2006  . Td 10/13/2003  . Tdap 01/16/2014    Health Maintenance  Topic Date Due  . INFLUENZA VACCINE  02/01/2018  . TETANUS/TDAP  01/17/2024  . DEXA SCAN  Completed  . PNA vac Low Risk Adult  Completed     Discussed health benefits of physical activity, and encouraged her to engage in regular exercise appropriate for her age and condition.  At 84 yo pt will forego pelvic/DRE/colon screening.  1.Annual Physical   2. Essential (primary) hypertension  - CBC with Differential/Platelet - Comprehensive metabolic panel - TSH  3. Mixed hyperlipidemia  - Lipid panel  4. Bilateral hearing  loss, unspecified hearing loss type  - Ambulatory referral to Audiology 5.Afib 6.Osteoporosis Consider repeating BMD.  I have done the exam and reviewed the above chart  and it is accurate to the best of my knowledge. Development worker, community has been used in this note in any air is in the dictation or transcription are unintentional.  Wilhemena Durie, MD  [place]

## 2018-03-01 NOTE — Patient Instructions (Addendum)
Latasha Anderson , Thank you for taking time to come for your Medicare Wellness Visit. I appreciate your ongoing commitment to your health goals. Please review the following plan we discussed and let me know if I can assist you in the future.   Screening recommendations/referrals: Colonoscopy: Up to date Mammogram: Up to date Bone Density: Up to date Recommended yearly ophthalmology/optometry visit for glaucoma screening and checkup Recommended yearly dental visit for hygiene and checkup  Vaccinations: Influenza vaccine: Up to date Pneumococcal vaccine: Up to date Tdap vaccine: Up to date Shingles vaccine: Pt declines today.     Advanced directives: Advance directive discussed with you today. Even though you declined this today please call our office should you change your mind and we can give you the proper paperwork for you to fill out.  Conditions/risks identified: Recommend to continue trying to increase water intake to at least 3 glasses ever day.   Next appointment: 9:40 AM today with Dr Rosanna Randy.    Preventive Care 19 Years and Older, Female Preventive care refers to lifestyle choices and visits with your health care provider that can promote health and wellness. What does preventive care include?  A yearly physical exam. This is also called an annual well check.  Dental exams once or twice a year.  Routine eye exams. Ask your health care provider how often you should have your eyes checked.  Personal lifestyle choices, including:  Daily care of your teeth and gums.  Regular physical activity.  Eating a healthy diet.  Avoiding tobacco and drug use.  Limiting alcohol use.  Practicing safe sex.  Taking low-dose aspirin every day.  Taking vitamin and mineral supplements as recommended by your health care provider. What happens during an annual well check? The services and screenings done by your health care provider during your annual well check will depend on your  age, overall health, lifestyle risk factors, and family history of disease. Counseling  Your health care provider may ask you questions about your:  Alcohol use.  Tobacco use.  Drug use.  Emotional well-being.  Home and relationship well-being.  Sexual activity.  Eating habits.  History of falls.  Memory and ability to understand (cognition).  Work and work Statistician.  Reproductive health. Screening  You may have the following tests or measurements:  Height, weight, and BMI.  Blood pressure.  Lipid and cholesterol levels. These may be checked every 5 years, or more frequently if you are over 74 years old.  Skin check.  Lung cancer screening. You may have this screening every year starting at age 48 if you have a 30-pack-year history of smoking and currently smoke or have quit within the past 15 years.  Fecal occult blood test (FOBT) of the stool. You may have this test every year starting at age 67.  Flexible sigmoidoscopy or colonoscopy. You may have a sigmoidoscopy every 5 years or a colonoscopy every 10 years starting at age 34.  Hepatitis C blood test.  Hepatitis B blood test.  Sexually transmitted disease (STD) testing.  Diabetes screening. This is done by checking your blood sugar (glucose) after you have not eaten for a while (fasting). You may have this done every 1-3 years.  Bone density scan. This is done to screen for osteoporosis. You may have this done starting at age 13.  Mammogram. This may be done every 1-2 years. Talk to your health care provider about how often you should have regular mammograms. Talk with your health care provider  about your test results, treatment options, and if necessary, the need for more tests. Vaccines  Your health care provider may recommend certain vaccines, such as:  Influenza vaccine. This is recommended every year.  Tetanus, diphtheria, and acellular pertussis (Tdap, Td) vaccine. You may need a Td booster  every 10 years.  Zoster vaccine. You may need this after age 38.  Pneumococcal 13-valent conjugate (PCV13) vaccine. One dose is recommended after age 35.  Pneumococcal polysaccharide (PPSV23) vaccine. One dose is recommended after age 67. Talk to your health care provider about which screenings and vaccines you need and how often you need them. This information is not intended to replace advice given to you by your health care provider. Make sure you discuss any questions you have with your health care provider. Document Released: 07/17/2015 Document Revised: 03/09/2016 Document Reviewed: 04/21/2015 Elsevier Interactive Patient Education  2017 [place] Prevention in the Home Falls can cause injuries. They can happen to people of all ages. There are many things you can do to make your home safe and to help prevent falls. What can I do on the outside of my home?  Regularly fix the edges of walkways and driveways and fix any cracks.  Remove anything that might make you trip as you walk through a door, such as a raised step or threshold.  Trim any bushes or trees on the path to your home.  Use bright outdoor lighting.  Clear any walking paths of anything that might make someone trip, such as rocks or tools.  Regularly check to see if handrails are loose or broken. Make sure that both sides of any steps have handrails.  Any raised decks and porches should have guardrails on the edges.  Have any leaves, snow, or ice cleared regularly.  Use sand or salt on walking paths during winter.  Clean up any spills in your garage right away. This includes oil or grease spills. What can I do in the bathroom?  Use night lights.  Install grab bars by the toilet and in the tub and shower. Do not use towel bars as grab bars.  Use non-skid mats or decals in the tub or shower.  If you need to sit down in the shower, use a plastic, non-slip stool.  Keep the floor dry. Clean up any  water that spills on the floor as soon as it happens.  Remove soap buildup in the tub or shower regularly.  Attach bath mats securely with double-sided non-slip rug tape.  Do not have throw rugs and other things on the floor that can make you trip. What can I do in the bedroom?  Use night lights.  Make sure that you have a light by your bed that is easy to reach.  Do not use any sheets or blankets that are too big for your bed. They should not hang down onto the floor.  Have a firm chair that has side arms. You can use this for support while you get dressed.  Do not have throw rugs and other things on the floor that can make you trip. What can I do in the kitchen?  Clean up any spills right away.  Avoid walking on wet floors.  Keep items that you use a lot in easy-to-reach places.  If you need to reach something above you, use a strong step stool that has a grab bar.  Keep electrical cords out of the way.  Do not use floor polish or  wax that makes floors slippery. If you must use wax, use non-skid floor wax.  Do not have throw rugs and other things on the floor that can make you trip. What can I do with my stairs?  Do not leave any items on the stairs.  Make sure that there are handrails on both sides of the stairs and use them. Fix handrails that are broken or loose. Make sure that handrails are as long as the stairways.  Check any carpeting to make sure that it is firmly attached to the stairs. Fix any carpet that is loose or worn.  Avoid having throw rugs at the top or bottom of the stairs. If you do have throw rugs, attach them to the floor with carpet tape.  Make sure that you have a light switch at the top of the stairs and the bottom of the stairs. If you do not have them, ask someone to add them for you. What else can I do to help prevent falls?  Wear shoes that:  Do not have high heels.  Have rubber bottoms.  Are comfortable and fit you well.  Are closed  at the toe. Do not wear sandals.  If you use a stepladder:  Make sure that it is fully opened. Do not climb a closed stepladder.  Make sure that both sides of the stepladder are locked into place.  Ask someone to hold it for you, if possible.  Clearly mark and make sure that you can see:  Any grab bars or handrails.  First and last steps.  Where the edge of each step is.  Use tools that help you move around (mobility aids) if they are needed. These include:  Canes.  Walkers.  Scooters.  Crutches.  Turn on the lights when you go into a dark area. Replace any light bulbs as soon as they burn out.  Set up your furniture so you have a clear path. Avoid moving your furniture around.  If any of your floors are uneven, fix them.  If there are any pets around you, be aware of where they are.  Review your medicines with your doctor. Some medicines can make you feel dizzy. This can increase your chance of falling. Ask your doctor what other things that you can do to help prevent falls. This information is not intended to replace advice given to you by your health care provider. Make sure you discuss any questions you have with your health care provider. Document Released: 04/16/2009 Document Revised: 11/26/2015 Document Reviewed: 07/25/2014 Elsevier Interactive Patient Education  2017 [employer].

## 2018-03-01 NOTE — Patient Instructions (Signed)
We have flu clinic on Wednesday afternoon and Saturday mornings.

## 2018-03-07 ENCOUNTER — Telehealth: Payer: Self-pay

## 2018-03-07 LAB — COMPREHENSIVE METABOLIC PANEL
ALBUMIN: 4.2 g/dL (ref 3.5–4.7)
ALK PHOS: 60 IU/L (ref 39–117)
ALT: 15 IU/L (ref 0–32)
AST: 20 IU/L (ref 0–40)
Albumin/Globulin Ratio: 2 (ref 1.2–2.2)
BILIRUBIN TOTAL: 0.8 mg/dL (ref 0.0–1.2)
BUN/Creatinine Ratio: 11 — ABNORMAL LOW (ref 12–28)
BUN: 7 mg/dL — ABNORMAL LOW (ref 8–27)
CHLORIDE: 100 mmol/L (ref 96–106)
CO2: 27 mmol/L (ref 20–29)
Calcium: 9.3 mg/dL (ref 8.7–10.3)
Creatinine, Ser: 0.66 mg/dL (ref 0.57–1.00)
GFR calc Af Amer: 93 mL/min/{1.73_m2} (ref >59)
GFR calc non Af Amer: 81 mL/min/{1.73_m2} (ref >59)
GLOBULIN, TOTAL: 2.1 g/dL (ref 1.5–4.5)
GLUCOSE: 101 mg/dL — AB (ref 65–99)
Potassium: 3.7 mmol/L (ref 3.5–5.2)
Sodium: 143 mmol/L (ref 134–144)
Total Protein: 6.3 g/dL (ref 6.0–8.5)

## 2018-03-07 LAB — CBC WITH DIFFERENTIAL/PLATELET
BASOS: 0 %
Basophils Absolute: 0 10*3/uL (ref 0.0–0.2)
EOS (ABSOLUTE): 0.3 10*3/uL (ref 0.0–0.4)
EOS: 4 %
HEMATOCRIT: 46.3 % (ref 34.0–46.6)
HEMOGLOBIN: 15.3 g/dL (ref 11.1–15.9)
IMMATURE GRANS (ABS): 0 10*3/uL (ref 0.0–0.1)
Immature Granulocytes: 0 %
LYMPHS ABS: 2 10*3/uL (ref 0.7–3.1)
LYMPHS: 27 %
MCH: 31.2 pg (ref 26.6–33.0)
MCHC: 33 g/dL (ref 31.5–35.7)
MCV: 95 fL (ref 79–97)
MONOCYTES: 8 %
Monocytes Absolute: 0.6 10*3/uL (ref 0.1–0.9)
NEUTROS ABS: 4.4 10*3/uL (ref 1.4–7.0)
Neutrophils: 61 %
Platelets: 263 10*3/uL (ref 150–450)
RBC: 4.9 x10E6/uL (ref 3.77–5.28)
RDW: 12.2 % — ABNORMAL LOW (ref 12.3–15.4)
WBC: 7.3 10*3/uL (ref 3.4–10.8)

## 2018-03-07 LAB — LIPID PANEL
CHOLESTEROL TOTAL: 162 mg/dL (ref 100–199)
Chol/HDL Ratio: 3.4 ratio (ref 0.0–4.4)
HDL: 47 mg/dL (ref >39)
LDL CALC: 76 mg/dL (ref 0–99)
TRIGLYCERIDES: 196 mg/dL — AB (ref 0–149)
VLDL CHOLESTEROL CAL: 39 mg/dL (ref 5–40)

## 2018-03-07 LAB — TSH: TSH: 4.47 u[IU]/mL (ref 0.450–4.500)

## 2018-03-07 NOTE — Telephone Encounter (Signed)
LMTCB 03/07/2018  Thanks,   -Mickel Baas

## 2018-03-07 NOTE — Telephone Encounter (Signed)
-----   Message from Jerrol Banana., MD sent at 03/07/2018 10:12 AM EDT ----- Labs OK

## 2018-03-08 NOTE — Telephone Encounter (Signed)
Patient advised as directed below.  Thanks,  -Evalina Tabak 

## 2018-03-29 ENCOUNTER — Ambulatory Visit: Payer: Medicare Other | Admitting: Cardiovascular Disease

## 2018-03-29 ENCOUNTER — Encounter: Payer: Self-pay | Admitting: Cardiovascular Disease

## 2018-03-29 VITALS — BP 130/80 | HR 73 | Ht 66.0 in | Wt 133.5 lb

## 2018-03-29 DIAGNOSIS — I482 Chronic atrial fibrillation, unspecified: Secondary | ICD-10-CM

## 2018-03-29 DIAGNOSIS — R0602 Shortness of breath: Secondary | ICD-10-CM

## 2018-03-29 DIAGNOSIS — I5032 Chronic diastolic (congestive) heart failure: Secondary | ICD-10-CM

## 2018-03-29 DIAGNOSIS — I10 Essential (primary) hypertension: Secondary | ICD-10-CM

## 2018-03-29 NOTE — Patient Instructions (Signed)
Medication Instructions: Your physician recommends that you continue on your current medications as directed. Please refer to the Current Medication list given to you today.  If you need a refill on your cardiac medications before your next appointment, please call your pharmacy.   Procedures/Testing: Your physician has requested that you have an echocardiogram. Echocardiography is a painless test that uses sound waves to create images of your heart. It provides your doctor with information about the size and shape of your heart and how well your heart's chambers and valves are working. You may receive an ultrasound enhancing agent through an IV if needed to better visualize your heart during the echo.This procedure takes approximately one hour. There are no restrictions for this procedure. This will take place at the [facility] clinic.    Follow-Up: Your physician wants you to follow-up in 12 months with Dr. Fletcher Anon. You will receive a reminder letter in the mail two months in advance. If you don't receive a letter, please call our office at [phone] to schedule this follow-up appointment.   Thank you for choosing [facility] at [facility]!

## 2018-03-29 NOTE — Progress Notes (Signed)
Cardiology Office Note   Date:  03/29/2018   ID:  Cobi, Aldape 09/15/1930, MRN [id number]  PCP:  Jerrol Banana., MD  Cardiologist:   Kathlyn Sacramento, MD   Chief Complaint  Patient presents with  . Other    12 month follow up. Meds reviewed by the pt.'s med list. Pt. c/o shortness of breath when walking up an incline.       History of Present Illness: Latasha Anderson is a 84 y.o. female who presents for a follow-up visit regarding chronic atrial fibrillation and chronic diastolic heart failure.  She has known history of chronic atrial fibrillation on long-term anticoagulation with warfarin, hypertension and hyperlipidemia. She has no history of ischemic heart disease.  Echocardiogram in 06/2015 showed normal LV systolic function, mild mitral regurgitation and moderate pulmonary hypertension with an estimated peak systolic pressure of 51 mmHg with a dilated IVC.  Volume overload improved significantly with small dose furosemide. She is also suspected of having chronic venous insufficiency.  She has been doing reasonably well with no recent chest pain or palpitations.  However, she reports worsening exertional dyspnea and fatigue.  According to her husband, she is not able to do the things she used to do in the past.  Past Medical History:  Diagnosis Date  . Atrial fibrillation [facility])    New diagnosis, asymptomatic, February, 2014  . Breast cancer ([place]) 1985   RT MASTECTOMY  . Chest pain   . Dyslipidemia   . Ejection fraction    EF 55-65%, January, 2012, question diastolic dysfunction  . Hypertension   . Hypokalemia    February, 2014, potassium started  . Mitral regurgitation    Mild, echo, 2012  . Shingles   . Shortness of breath    January, 0254, normal systolic function, question diastolic dysfunction    Past Surgical History:  Procedure Laterality Date  . ABDOMINAL HYSTERECTOMY     age 51  . AUGMENTATION MAMMAPLASTY Right 1985   RT MASTECTOMY  FOR BREAST CA  . EYE SURGERY     cataract  . MASTECTOMY Right 1985   reconstruction inplant as well     Current Outpatient Medications  Medication Sig Dispense Refill  . Acetaminophen (TYLENOL PO) Take by mouth as needed.    Marland Kitchen amLODipine-benazepril (LOTREL) 5-40 MG capsule TAKE 1 CAPSULE BY MOUTH EVERY DAY 30 capsule 1  . atorvastatin (LIPITOR) 10 MG tablet TAKE 1 TABLET BY MOUTH EVERY DAY 90 tablet 3  . Biotin 10000 MCG TABS Take 1 tablet by mouth daily.    . cholecalciferol (VITAMIN D) 1000 UNITS tablet Take 1,000 Units by mouth daily.    Marland Kitchen COUMADIN 2.5 MG tablet TAKE AS DIRECTED BY COUMADIN CLINIC 105 tablet 0  . furosemide (LASIX) 20 MG tablet TAKE 1 TABLET (20 MG TOTAL) BY MOUTH DAILY. 90 tablet 3  . latanoprost (XALATAN) 0.005 % ophthalmic solution Place into both eyes daily.     . timolol (TIMOPTIC) 0.5 % ophthalmic solution Place into both eyes daily.      No current facility-administered medications for this visit.     Allergies:   Ciprofloxacin; Cortisone; Nitrofurantoin monohyd macro; and Other    Social History:  The patient  reports that she has never smoked. She has never used smokeless tobacco. She reports that she does not drink alcohol or use drugs.   Family History:  The patient's family history includes Cancer in her brother; Diabetes in her father; Stroke in her  mother.      PHYSICAL EXAM: VS:  BP 130/80 (BP Location: Left Arm, Patient Position: Sitting, Cuff Size: Normal)   Pulse 73   Ht 5\' 6"  (1.676 m)   Wt 133 lb 8 oz (60.6 kg)   BMI 21.55 kg/m  , BMI Body mass index is 21.55 kg/m. GEN: Well nourished, well developed, in no acute distress  HEENT: normal  Neck: no JVD, carotid bruits, or masses Cardiac: Irregularly irregular; no murmurs, rubs, or gallops,no edema  Respiratory:  clear to auscultation bilaterally, normal work of breathing GI: soft, nontender, nondistended, + BS MS: no deformity or atrophy  Skin: warm and dry, no rash Neuro:   Strength and sensation are intact Psych: euthymic mood, full affect   EKG:  EKG is ordered today. The ekg ordered today demonstrates : Atrial fibrillation with nonspecific ST or T wave changes.   Recent Labs: 03/06/2018: ALT 15; BUN 7; Creatinine, Ser 0.66; Hemoglobin 15.3; Platelets 263; Potassium 3.7; Sodium 143; TSH 4.470    Lipid Panel    Component Value Date/Time   CHOL 162 03/06/2018 0830   TRIG 196 (H) 03/06/2018 0830   HDL 47 03/06/2018 0830   CHOLHDL 3.4 03/06/2018 0830   CHOLHDL 3.3 06/19/2017 0821   LDLCALC 76 03/06/2018 0830   LDLCALC 89 06/19/2017 0821      Wt Readings from Last 3 Encounters:  03/29/18 133 lb 8 oz (60.6 kg)  03/01/18 135 lb (61.2 kg)  03/01/18 135 lb 6.4 oz (61.4 kg)         ASSESSMENT AND PLAN:  1.  Chronic atrial fibrillation: Ventricular rate is well controlled Without medications. She is tolerating anticoagulation with warfarin with therapeutic INR.  Recent labs showed normal CBC and renal function.  2.  Chronic diastolic heart failure: She appears to be euvolemic on small dose furosemide.  Given worsening exertional dyspnea and fatigue, I requested a follow-up echocardiogram.  3.  Essential hypertension: Blood pressure is well controlled on current medications.    Disposition:   FU with me  12 months  Signed, Kathlyn Sacramento, MD  03/29/2018 11:29 AM    [place]

## 2018-04-06 ENCOUNTER — Ambulatory Visit (INDEPENDENT_AMBULATORY_CARE_PROVIDER_SITE_OTHER): Payer: Medicare Other

## 2018-04-06 DIAGNOSIS — R0602 Shortness of breath: Secondary | ICD-10-CM | POA: Diagnosis not present

## 2018-04-06 DIAGNOSIS — I482 Chronic atrial fibrillation, unspecified: Secondary | ICD-10-CM

## 2018-04-11 ENCOUNTER — Ambulatory Visit: Payer: Medicare Other

## 2018-04-11 DIAGNOSIS — Z5181 Encounter for therapeutic drug level monitoring: Secondary | ICD-10-CM | POA: Diagnosis not present

## 2018-04-11 DIAGNOSIS — Z7901 Long term (current) use of anticoagulants: Secondary | ICD-10-CM

## 2018-04-11 DIAGNOSIS — I4891 Unspecified atrial fibrillation: Secondary | ICD-10-CM

## 2018-04-11 LAB — POCT INR: INR: 2.7 (ref 2.0–3.0)

## 2018-04-11 NOTE — Patient Instructions (Signed)
Continue taking same dosage 1 tablet everyday except 1/2 tablet on Wednesdays.  Recheck in 6 weeks. 

## 2018-04-22 ENCOUNTER — Other Ambulatory Visit: Payer: Self-pay | Admitting: Cardiovascular Disease

## 2018-05-07 ENCOUNTER — Other Ambulatory Visit: Payer: Self-pay | Admitting: Cardiovascular Disease

## 2018-05-16 ENCOUNTER — Ambulatory Visit (INDEPENDENT_AMBULATORY_CARE_PROVIDER_SITE_OTHER): Payer: Medicare Other

## 2018-05-16 DIAGNOSIS — Z23 Encounter for immunization: Secondary | ICD-10-CM | POA: Diagnosis not present

## 2018-05-19 ENCOUNTER — Other Ambulatory Visit: Payer: Self-pay | Admitting: Family Medicine

## 2018-05-23 ENCOUNTER — Ambulatory Visit: Payer: Medicare Other

## 2018-05-23 DIAGNOSIS — Z7901 Long term (current) use of anticoagulants: Secondary | ICD-10-CM

## 2018-05-23 DIAGNOSIS — Z5181 Encounter for therapeutic drug level monitoring: Secondary | ICD-10-CM | POA: Diagnosis not present

## 2018-05-23 DIAGNOSIS — I4891 Unspecified atrial fibrillation: Secondary | ICD-10-CM | POA: Diagnosis not present

## 2018-05-23 LAB — POCT INR: INR: 3 (ref 2.0–3.0)

## 2018-05-23 NOTE — Patient Instructions (Signed)
Continue taking same dosage 1 tablet everyday except 1/2 tablet on Wednesdays.  Recheck in 6 weeks. 

## 2018-05-28 ENCOUNTER — Other Ambulatory Visit: Payer: Self-pay | Admitting: Family Medicine

## 2018-05-28 DIAGNOSIS — Z1231 Encounter for screening mammogram for malignant neoplasm of breast: Secondary | ICD-10-CM

## 2018-07-02 ENCOUNTER — Ambulatory Visit: Payer: Medicare Other

## 2018-07-02 DIAGNOSIS — I4891 Unspecified atrial fibrillation: Secondary | ICD-10-CM

## 2018-07-02 DIAGNOSIS — Z5181 Encounter for therapeutic drug level monitoring: Secondary | ICD-10-CM | POA: Diagnosis not present

## 2018-07-02 DIAGNOSIS — Z7901 Long term (current) use of anticoagulants: Secondary | ICD-10-CM | POA: Diagnosis not present

## 2018-07-02 LAB — POCT INR: INR: 2.2 (ref 2.0–3.0)

## 2018-07-02 NOTE — Patient Instructions (Signed)
Continue taking same dosage 1 tablet everyday except 1/2 tablet on Wednesdays.  Recheck in 6 weeks. 

## 2018-07-05 ENCOUNTER — Ambulatory Visit
Admission: RE | Admit: 2018-07-05 | Discharge: 2018-07-05 | Disposition: A | Discharge status: Home / Self Care | Payer: Medicare Other | Source: Ambulatory Visit | Attending: Family Medicine | Admitting: Family Medicine

## 2018-07-05 DIAGNOSIS — Z1231 Encounter for screening mammogram for malignant neoplasm of breast: Secondary | ICD-10-CM | POA: Diagnosis present

## 2018-07-21 ENCOUNTER — Other Ambulatory Visit: Payer: Self-pay | Admitting: Cardiovascular Disease

## 2018-07-23 NOTE — Telephone Encounter (Signed)
Please review for refill.  

## 2018-08-15 ENCOUNTER — Ambulatory Visit: Payer: Medicare Other

## 2018-08-15 DIAGNOSIS — Z7901 Long term (current) use of anticoagulants: Secondary | ICD-10-CM

## 2018-08-15 DIAGNOSIS — I4891 Unspecified atrial fibrillation: Secondary | ICD-10-CM

## 2018-08-15 DIAGNOSIS — Z5181 Encounter for therapeutic drug level monitoring: Secondary | ICD-10-CM | POA: Diagnosis not present

## 2018-08-15 LAB — POCT INR: INR: 2.4 (ref 2.0–3.0)

## 2018-08-15 NOTE — Patient Instructions (Signed)
Continue taking same dosage 1 tablet everyday except 1/2 tablet on Wednesdays.  Recheck in 6 weeks. 

## 2018-09-07 ENCOUNTER — Other Ambulatory Visit: Payer: Self-pay | Admitting: *Deleted

## 2018-09-07 MED ORDER — COUMADIN 2.5 MG PO TABS: ORAL_TABLET | ORAL | 0 refills | Status: DC

## 2018-09-24 ENCOUNTER — Telehealth: Payer: Self-pay | Admitting: Pharmacist

## 2018-09-24 NOTE — Telephone Encounter (Signed)
1. Do you currently have a fever? no (yes = cancel and refer to pcp for e-visit) 2. Have you recently travelled on a cruise, internationally, or to [place], [place], [place], [place], [place], or [place], [place] [employer]) ? no (yes = cancel, stay home, monitor symptoms, and contact pcp or initiate e-visit if symptoms develop) 3. Have you been in contact with someone that is currently pending confirmation of Covid19 testing or has been confirmed to have the [place] virus?  no (yes = cancel, stay home, away from tested individual, monitor symptoms, and contact pcp or initiate e-visit if symptoms develop) 4. Are you currently experiencing fatigue or cough? no (yes = pt should be prepared to have a mask placed at the time of their visit).  Pt. Advised that we are restricting visitors at this time and anyone present in the vehicle should meet the above criteria as well. Advised that visit will be at curbside for finger stick ONLY and will receive call with instructions. Pt also advised to please bring own pen for signature of arrival document.   Patient will need her husband to drive her. He was neg for all questions above

## 2018-09-26 ENCOUNTER — Other Ambulatory Visit: Payer: Self-pay

## 2018-09-26 ENCOUNTER — Ambulatory Visit (INDEPENDENT_AMBULATORY_CARE_PROVIDER_SITE_OTHER): Payer: Medicare Other

## 2018-09-26 DIAGNOSIS — Z7901 Long term (current) use of anticoagulants: Secondary | ICD-10-CM | POA: Diagnosis not present

## 2018-09-26 DIAGNOSIS — Z5181 Encounter for therapeutic drug level monitoring: Secondary | ICD-10-CM | POA: Diagnosis not present

## 2018-09-26 DIAGNOSIS — I4891 Unspecified atrial fibrillation: Secondary | ICD-10-CM | POA: Diagnosis not present

## 2018-09-26 LAB — POCT INR: INR: 2.4 (ref 2.0–3.0)

## 2018-09-26 NOTE — Patient Instructions (Signed)
Continue taking same dosage 1 tablet everyday except 1/2 tablet on Wednesdays.  Recheck in 8 weeks.

## 2018-10-18 ENCOUNTER — Other Ambulatory Visit: Payer: Self-pay | Admitting: Cardiovascular Disease

## 2018-11-19 ENCOUNTER — Telehealth: Payer: Self-pay

## 2018-11-19 NOTE — Telephone Encounter (Signed)

## 2018-11-21 ENCOUNTER — Ambulatory Visit (INDEPENDENT_AMBULATORY_CARE_PROVIDER_SITE_OTHER): Payer: Medicare Other

## 2018-11-21 ENCOUNTER — Other Ambulatory Visit: Payer: Self-pay

## 2018-11-21 DIAGNOSIS — Z5181 Encounter for therapeutic drug level monitoring: Secondary | ICD-10-CM

## 2018-11-21 DIAGNOSIS — I4891 Unspecified atrial fibrillation: Secondary | ICD-10-CM | POA: Diagnosis not present

## 2018-11-21 DIAGNOSIS — Z7901 Long term (current) use of anticoagulants: Secondary | ICD-10-CM

## 2018-11-21 LAB — POCT INR: INR: 2.5 (ref 2.0–3.0)

## 2018-11-21 NOTE — Patient Instructions (Signed)
Continue taking same dosage 1 tablet everyday except 1/2 tablet on Wednesdays.  Recheck in 6 weeks.

## 2018-12-02 ENCOUNTER — Other Ambulatory Visit: Payer: Self-pay | Admitting: Cardiovascular Disease

## 2018-12-03 NOTE — Telephone Encounter (Signed)
Please review for refill.  

## 2018-12-31 ENCOUNTER — Telehealth: Payer: Self-pay

## 2018-12-31 NOTE — Telephone Encounter (Signed)

## 2019-01-02 ENCOUNTER — Ambulatory Visit (INDEPENDENT_AMBULATORY_CARE_PROVIDER_SITE_OTHER): Payer: Medicare Other

## 2019-01-02 ENCOUNTER — Other Ambulatory Visit: Payer: Self-pay

## 2019-01-02 DIAGNOSIS — Z7901 Long term (current) use of anticoagulants: Secondary | ICD-10-CM

## 2019-01-02 DIAGNOSIS — I4891 Unspecified atrial fibrillation: Secondary | ICD-10-CM

## 2019-01-02 DIAGNOSIS — Z5181 Encounter for therapeutic drug level monitoring: Secondary | ICD-10-CM | POA: Diagnosis not present

## 2019-01-02 LAB — POCT INR: INR: 2.5 (ref 2.0–3.0)

## 2019-01-02 NOTE — Patient Instructions (Signed)
Continue taking same dosage 1 tablet everyday except 1/2 tablet on Wednesdays.  Recheck in 6 weeks.

## 2019-01-21 ENCOUNTER — Other Ambulatory Visit: Payer: Self-pay | Admitting: Cardiovascular Disease

## 2019-02-13 ENCOUNTER — Ambulatory Visit (INDEPENDENT_AMBULATORY_CARE_PROVIDER_SITE_OTHER): Payer: Medicare Other

## 2019-02-13 ENCOUNTER — Other Ambulatory Visit: Payer: Self-pay

## 2019-02-13 DIAGNOSIS — Z7901 Long term (current) use of anticoagulants: Secondary | ICD-10-CM

## 2019-02-13 DIAGNOSIS — I4891 Unspecified atrial fibrillation: Secondary | ICD-10-CM | POA: Diagnosis not present

## 2019-02-13 DIAGNOSIS — Z5181 Encounter for therapeutic drug level monitoring: Secondary | ICD-10-CM

## 2019-02-13 LAB — POCT INR: INR: 2.3 (ref 2.0–3.0)

## 2019-02-13 NOTE — Patient Instructions (Signed)
Continue taking same dosage 1 tablet everyday except 1/2 tablet on Wednesdays.  Recheck in 6 weeks.

## 2019-02-21 ENCOUNTER — Telehealth: Payer: Self-pay

## 2019-02-21 MED ORDER — AMLODIPINE BESY-BENAZEPRIL HCL 5-40 MG PO CAPS: ORAL_CAPSULE | ORAL | 1 refills | Status: DC

## 2019-02-21 NOTE — Telephone Encounter (Signed)
Requested Prescriptions   Signed Prescriptions Disp Refills  . amLODipine-benazepril (LOTREL) 5-40 MG capsule 30 capsule 1    Sig: TAKE 1 CAPSULE BY MOUTH EVERY DAY    Authorizing Provider: Kathlyn Sacramento A    Ordering User: Raelene Bott, BRANDY L

## 2019-02-23 ENCOUNTER — Other Ambulatory Visit: Payer: Self-pay | Admitting: Cardiovascular Disease

## 2019-02-25 ENCOUNTER — Other Ambulatory Visit: Payer: Self-pay

## 2019-02-25 MED ORDER — WARFARIN SODIUM 2.5 MG PO TABS: 2.5000 mg | ORAL_TABLET | ORAL | 0 refills | Status: DC

## 2019-02-25 NOTE — Telephone Encounter (Signed)
Please review for refill. Thanks!  

## 2019-03-01 ENCOUNTER — Other Ambulatory Visit: Payer: Self-pay | Admitting: Cardiovascular Disease

## 2019-03-06 ENCOUNTER — Ambulatory Visit (INDEPENDENT_AMBULATORY_CARE_PROVIDER_SITE_OTHER): Payer: Medicare Other

## 2019-03-06 ENCOUNTER — Other Ambulatory Visit: Payer: Self-pay

## 2019-03-06 DIAGNOSIS — Z13820 Encounter for screening for osteoporosis: Secondary | ICD-10-CM | POA: Diagnosis not present

## 2019-03-06 DIAGNOSIS — Z0000 Encounter for general adult medical examination without abnormal findings: Secondary | ICD-10-CM | POA: Diagnosis not present

## 2019-03-06 NOTE — Patient Instructions (Signed)
Latasha Anderson , Thank you for taking time to come for your Medicare Wellness Visit. I appreciate your ongoing commitment to your health goals. Please review the following plan we discussed and let me know if I can assist you in the future.   Screening recommendations/referrals: Colonoscopy: No longer required.  Mammogram: No longer required.  Bone Density: Ordered today. Pt provided with contact info and advised to call to schedule appt. Pt aware the office will call re: appt. checkup Recommended yearly dental visit for hygiene and checkup  Vaccinations: Influenza vaccine: Currently due Pneumococcal vaccine: Completed series Tdap vaccine: Up to date, due 01/2024 Shingles vaccine: Pt declines today.     Advanced directives: Advance directive discussed with you today. Even though you declined this today please call our office should you change your mind and we can give you the proper paperwork for you to fill out.  Conditions/risks identified: Fall risk prevention discussed today. Continue to increase water intake to 6-8 8 oz glasses a day.   Next appointment: 03/07/19 @ 9:40 AM with Dr Rosanna Randy.    Preventive Care 11 Years and Older, Female Preventive care refers to lifestyle choices and visits with your health care provider that can promote health and wellness. What does preventive care include?  A yearly physical exam. This is also called an annual well check.  Dental exams once or twice a year.  Routine eye exams. Ask your health care provider how often you should have your eyes checked.  Personal lifestyle choices, including:  Daily care of your teeth and gums.  Regular physical activity.  Eating a healthy diet.  Avoiding tobacco and drug use.  Limiting alcohol use.  Practicing safe sex.  Taking low-dose aspirin every day.  Taking vitamin and mineral supplements as recommended by your health care provider. What happens during an annual well check? The services and  screenings done by your health care provider during your annual well check will depend on your age, overall health, lifestyle risk factors, and family history of disease. Counseling  Your health care provider may ask you questions about your:  Alcohol use.  Tobacco use.  Drug use.  Emotional well-being.  Home and relationship well-being.  Sexual activity.  Eating habits.  History of falls.  Memory and ability to understand (cognition).  Work and work Statistician.  Reproductive health. Screening  You may have the following tests or measurements:  Height, weight, and BMI.  Blood pressure.  Lipid and cholesterol levels. These may be checked every 5 years, or more frequently if you are over 65 years old.  Skin check.  Lung cancer screening. You may have this screening every year starting at age 42 if you have a 30-pack-year history of smoking and currently smoke or have quit within the past 15 years.  Fecal occult blood test (FOBT) of the stool. You may have this test every year starting at age 64.  Flexible sigmoidoscopy or colonoscopy. You may have a sigmoidoscopy every 5 years or a colonoscopy every 10 years starting at age 1.  Hepatitis C blood test.  Hepatitis B blood test.  Sexually transmitted disease (STD) testing.  Diabetes screening. This is done by checking your blood sugar (glucose) after you have not eaten for a while (fasting). You may have this done every 1-3 years.  Bone density scan. This is done to screen for osteoporosis. You may have this done starting at age 20.  Mammogram. This may be done every 1-2 years. Talk to your health  care provider about how often you should have regular mammograms. Talk with your health care provider about your test results, treatment options, and if necessary, the need for more tests. Vaccines  Your health care provider may recommend certain vaccines, such as:  Influenza vaccine. This is recommended every year.   Tetanus, diphtheria, and acellular pertussis (Tdap, Td) vaccine. You may need a Td booster every 10 years.  Zoster vaccine. You may need this after age 96.  Pneumococcal 13-valent conjugate (PCV13) vaccine. One dose is recommended after age 35.  Pneumococcal polysaccharide (PPSV23) vaccine. One dose is recommended after age 60. Talk to your health care provider about which screenings and vaccines you need and how often you need them. This information is not intended to replace advice given to you by your health care provider. Make sure you discuss any questions you have with your health care provider. Document Released: 07/17/2015 Document Revised: 03/09/2016 Document Reviewed: 04/21/2015 Elsevier Interactive Patient Education  2017 [place] Prevention in the Home Falls can cause injuries. They can happen to people of all ages. There are many things you can do to make your home safe and to help prevent falls. What can I do on the outside of my home?  Regularly fix the edges of walkways and driveways and fix any cracks.  Remove anything that might make you trip as you walk through a door, such as a raised step or threshold.  Trim any bushes or trees on the path to your home.  Use bright outdoor lighting.  Clear any walking paths of anything that might make someone trip, such as rocks or tools.  Regularly check to see if handrails are loose or broken. Make sure that both sides of any steps have handrails.  Any raised decks and porches should have guardrails on the edges.  Have any leaves, snow, or ice cleared regularly.  Use sand or salt on walking paths during winter.  Clean up any spills in your garage right away. This includes oil or grease spills. What can I do in the bathroom?  Use night lights.  Install grab bars by the toilet and in the tub and shower. Do not use towel bars as grab bars.  Use non-skid mats or decals in the tub or shower.  If you need to sit  down in the shower, use a plastic, non-slip stool.  Keep the floor dry. Clean up any water that spills on the floor as soon as it happens.  Remove soap buildup in the tub or shower regularly.  Attach bath mats securely with double-sided non-slip rug tape.  Do not have throw rugs and other things on the floor that can make you trip. What can I do in the bedroom?  Use night lights.  Make sure that you have a light by your bed that is easy to reach.  Do not use any sheets or blankets that are too big for your bed. They should not hang down onto the floor.  Have a firm chair that has side arms. You can use this for support while you get dressed.  Do not have throw rugs and other things on the floor that can make you trip. What can I do in the kitchen?  Clean up any spills right away.  Avoid walking on wet floors.  Keep items that you use a lot in easy-to-reach places.  If you need to reach something above you, use a strong step stool that has a grab  bar.  Keep electrical cords out of the way.  Do not use floor polish or wax that makes floors slippery. If you must use wax, use non-skid floor wax.  Do not have throw rugs and other things on the floor that can make you trip. What can I do with my stairs?  Do not leave any items on the stairs.  Make sure that there are handrails on both sides of the stairs and use them. Fix handrails that are broken or loose. Make sure that handrails are as long as the stairways.  Check any carpeting to make sure that it is firmly attached to the stairs. Fix any carpet that is loose or worn.  Avoid having throw rugs at the top or bottom of the stairs. If you do have throw rugs, attach them to the floor with carpet tape.  Make sure that you have a light switch at the top of the stairs and the bottom of the stairs. If you do not have them, ask someone to add them for you. What else can I do to help prevent falls?  Wear shoes that:  Do not have  high heels.  Have rubber bottoms.  Are comfortable and fit you well.  Are closed at the toe. Do not wear sandals.  If you use a stepladder:  Make sure that it is fully opened. Do not climb a closed stepladder.  Make sure that both sides of the stepladder are locked into place.  Ask someone to hold it for you, if possible.  Clearly mark and make sure that you can see:  Any grab bars or handrails.  First and last steps.  Where the edge of each step is.  Use tools that help you move around (mobility aids) if they are needed. These include:  Canes.  Walkers.  Scooters.  Crutches.  Turn on the lights when you go into a dark area. Replace any light bulbs as soon as they burn out.  Set up your furniture so you have a clear path. Avoid moving your furniture around.  If any of your floors are uneven, fix them.  If there are any pets around you, be aware of where they are.  Review your medicines with your doctor. Some medicines can make you feel dizzy. This can increase your chance of falling. Ask your doctor what other things that you can do to help prevent falls. This information is not intended to replace advice given to you by your health care provider. Make sure you discuss any questions you have with your health care provider. Document Released: 04/16/2009 Document Revised: 11/26/2015 Document Reviewed: 07/25/2014 Elsevier Interactive Patient Education  2017 [employer].

## 2019-03-06 NOTE — Progress Notes (Signed)
Subjective:   Latasha Anderson is a 85 y.o. female who presents for Medicare Annual (Subsequent) preventive examination.    This visit is being conducted through telemedicine due to the COVID-19 pandemic. This patient has given me verbal consent via doximity to conduct this visit, patient states they are participating from their home address. Some vital signs may be absent or patient reported.    Patient identification: identified by name, DOB, and current address  Review of Systems:  N/A  Cardiac Risk Factors include: advanced age (>55men, >12 women);dyslipidemia;hypertension;obesity (BMI >30kg/m2)     Objective:     Vitals: There were no vitals taken for this visit.  There is no height or weight on file to calculate BMI. Unable to obtain vitals due to visit being conducted via telephonically.   Advanced Directives 03/06/2019 03/01/2018 11/24/2016 12/24/2014  Does Patient Have a Medical Advance Directive? No No No No  Would patient like information on creating a medical advance directive? No - Patient declined No - Patient declined Yes (ED - Information included in AVS) -    Tobacco Social History   Tobacco Use  Smoking Status Never Smoker  Smokeless Tobacco Never Used     Counseling given: Not Answered   Clinical Intake:  Pre-visit preparation completed: Yes  Pain : No/denies pain Pain Score: 0-No pain     Nutritional Risks: None Diabetes: No  How often do you need to have someone help you when you read instructions, pamphlets, or other written materials from your doctor or pharmacy?: 1 - Never  Interpreter Needed?: No  Information entered by :: [facility], LPN  Past Medical History:  Diagnosis Date  . Atrial fibrillation [facility])    New diagnosis, asymptomatic, February, 2014  . Breast cancer ([place]) 1985   RT MASTECTOMY  . Chest pain   . Dyslipidemia   . Ejection fraction    EF 55-65%, January, 2012, question diastolic dysfunction  . Hypertension   .  Hypokalemia    February, 2014, potassium started  . Mitral regurgitation    Mild, echo, 2012  . Shingles   . Shortness of breath    January, 0000000, normal systolic function, question diastolic dysfunction   Past Surgical History:  Procedure Laterality Date  . ABDOMINAL HYSTERECTOMY     age 59  . AUGMENTATION MAMMAPLASTY Right 1985   RT MASTECTOMY FOR BREAST CA  . EYE SURGERY     cataract  . MASTECTOMY Right 1985   reconstruction inplant as well   Family History  Problem Relation Age of Onset  . Stroke Mother   . Diabetes Father   . Cancer Brother        prostate  . Breast cancer Neg Hx    Social History   Socioeconomic History  . Marital status: Married    Spouse name: Not on file  . Number of children: 1  . Years of education: Not on file  . Highest education level: Bachelor's degree (e.g., BA, AB, BS)  Occupational History  . Not on file  Social Needs  . Financial resource strain: Not hard at all  . Food insecurity    Worry: Never true    Inability: Never true  . Transportation needs    Medical: No    Non-medical: No  Tobacco Use  . Smoking status: Never Smoker  . Smokeless tobacco: Never Used  Substance and Sexual Activity  . Alcohol use: No    Alcohol/week: 0.0 standard drinks  . Drug use: No  .  Sexual activity: Never  Lifestyle  . Physical activity    Days per week: 0 days    Minutes per session: 0 min  . Stress: To some extent  Relationships  . Social Herbalist on phone: Patient refused    Gets together: Patient refused    Attends religious service: Patient refused    Active member of club or organization: Patient refused    Attends meetings of clubs or organizations: Patient refused    Relationship status: Patient refused  Other Topics Concern  . Not on file  Social History Narrative  . Not on file    Outpatient Encounter Medications as of 03/06/2019  Medication Sig  . Acetaminophen (TYLENOL PO) Take by mouth as needed.  Marland Kitchen  amLODipine-benazepril (LOTREL) 5-40 MG capsule TAKE 1 CAPSULE BY MOUTH EVERY DAY  . atorvastatin (LIPITOR) 10 MG tablet TAKE 1 TABLET BY MOUTH EVERY DAY  . Biotin 10000 MCG TABS Take 1 tablet by mouth daily.  . cholecalciferol (VITAMIN D) 1000 UNITS tablet Take 1,000 Units by mouth daily.  . furosemide (LASIX) 20 MG tablet TAKE 1 TABLET BY MOUTH EVERY DAY  . latanoprost (XALATAN) 0.005 % ophthalmic solution Place into both eyes daily.   . timolol (TIMOPTIC) 0.5 % ophthalmic solution Place into both eyes daily.   Marland Kitchen warfarin (COUMADIN) 2.5 MG tablet Take 1 tablet (2.5 mg total) by mouth as directed.   No facility-administered encounter medications on file as of 03/06/2019.     Activities of Daily Living In your present state of health, do you have any difficulty performing the following activities: 03/06/2019  Hearing? Y  Comment Wears hearing aids.  Vision? N  Difficulty concentrating or making decisions? N  Walking or climbing stairs? N  Dressing or bathing? N  Doing errands, shopping? N  Preparing Food and eating ? N  Using the Toilet? N  In the past six months, have you accidently leaked urine? N  Do you have problems with loss of bowel control? N  Managing your Medications? N  Managing your Finances? N  Housekeeping or managing your Housekeeping? N  Some recent data might be hidden    Patient Care Team: Jerrol Banana., MD as PCP - General (Unknown Physician Specialty) Dingeldein, Remo Lipps, MD as Consulting Physician (Ophthalmology) Wellington Hampshire, MD as Consulting Physician (Cardiology) Rolm Bookbinder, MD as Consulting Physician (Dermatology)    Assessment:   This is a routine wellness examination for Latasha Anderson.  Exercise Activities and Dietary recommendations Current Exercise Habits: The patient does not participate in regular exercise at present, Exercise limited by: None identified  Goals    . Increase water intake     Recommend increasing water intake to 4-6 glasses  a day.    Marland Kitchen LIFESTYLE - DECREASE FALLS RISK     Recommend to remove any items from the home that may cause slips or trips.       Fall Risk: Fall Risk  03/06/2019 03/01/2018 11/24/2016 02/12/2015  Falls in the past year? 1 No No No  Number falls in past yr: 0 - - -  Injury with Fall? 0 - - -  Follow up Falls prevention discussed - - -    FALL RISK PREVENTION PERTAINING TO THE HOME:  Any stairs in or around the home? No  If so, are there any without handrails? N/A  Home free of loose throw rugs in walkways, pet beds, electrical cords, etc? Yes  Adequate lighting in your home  to reduce risk of falls? Yes   ASSISTIVE DEVICES UTILIZED TO PREVENT FALLS:  Life alert? No  Use of a cane, walker or w/c? No  Grab bars in the bathroom? No  Shower chair or bench in shower? No  Elevated toilet seat or a handicapped toilet? No    TIMED UP AND GO:  Was the test performed? No .    Depression Screen PHQ 2/9 Scores 03/06/2019 03/01/2018 03/01/2018 11/24/2016  PHQ - 2 Score 1 0 0 0  PHQ- 9 Score - 0 - 3     Cognitive Function: Declined today.      6CIT Screen 11/24/2016  What Year? 0 points  What month? 0 points  What time? 0 points  Count back from 20 0 points  Months in reverse 0 points  Repeat phrase 2 points  Total Score 2    Immunization History  Administered Date(s) Administered  . Influenza, High Dose Seasonal PF 04/23/2015, 04/28/2016, 04/27/2017, 05/16/2018  . Pneumococcal Conjugate-13 01/16/2014  . Pneumococcal Polysaccharide-23 05/13/2006  . Td 10/13/2003  . Tdap 01/16/2014    Qualifies for Shingles Vaccine? Yes . Due for Shingrix. Education has been provided regarding the importance of this vaccine. Pt has been advised to call insurance company to determine out of pocket expense. Advised may also receive vaccine at local pharmacy or Health Dept. Verbalized acceptance and understanding.  Tdap: Up to date  Flu Vaccine: Due for Flu vaccine. Does the patient want to  receive this vaccine today?  No .   Pneumococcal Vaccine: Completed series  Screening Tests Health Maintenance  Topic Date Due  . INFLUENZA VACCINE  02/02/2019  . DEXA SCAN  07/04/2026 (Originally 05/14/2006)  . TETANUS/TDAP  01/17/2024  . PNA vac Low Risk Adult  Completed    Cancer Screenings:  Colorectal Screening: No longer required.   Mammogram: No longer required.   Bone Density: Completed 05/14/01. Results reflect OSTEOPENIA. Repeat every 2 years. Ordered today. Pt provided with contact info and advised to call to schedule appt. Pt aware the office will call re: appt.  Lung Cancer Screening: (Low Dose CT Chest recommended if Age 69-80 years, 30 pack-year currently smoking OR have quit w/in 15years.) does not qualify.   Additional Screening:  Vision Screening: Recommended annual ophthalmology exams for early detection of glaucoma and other disorders of the eye.  Dental Screening: Recommended annual dental exams for proper oral hygiene  Community Resource Referral:  CRR required this visit?  No       Plan:  I have personally reviewed and addressed the Medicare Annual Wellness questionnaire and have noted the following in the patient's chart:  A. Medical and social history B. Use of alcohol, tobacco or illicit drugs  C. Current medications and supplements D. Functional ability and status E.  Nutritional status F.  Physical activity G. Advance directives H. List of other physicians I.  Hospitalizations, surgeries, and ER visits in previous 12 months J.  [place] such as hearing and vision if needed, cognitive and depression L. Referrals and appointments   In addition, I have reviewed and discussed with patient certain preventive protocols, quality metrics, and best practice recommendations. A written personalized care plan for preventive services as well as general preventive health recommendations were provided to patient. Nurse Health Advisor   Signed,    Ephram Kornegay [place], [place]  [id number] Nurse Health Advisor   Nurse Notes: Declined receiving an influenza vaccine at [place]. Pt would like to wait until later in  the season.

## 2019-03-07 ENCOUNTER — Ambulatory Visit: Payer: Medicare Other

## 2019-03-07 ENCOUNTER — Other Ambulatory Visit: Payer: Self-pay

## 2019-03-07 ENCOUNTER — Encounter: Payer: Self-pay | Admitting: Family Medicine

## 2019-03-07 ENCOUNTER — Ambulatory Visit (INDEPENDENT_AMBULATORY_CARE_PROVIDER_SITE_OTHER): Payer: Medicare Other | Admitting: Family Medicine

## 2019-03-07 VITALS — BP 128/80 | HR 70 | Temp 98.2°F | Resp 16 | Ht 66.0 in | Wt 133.0 lb

## 2019-03-07 DIAGNOSIS — I10 Essential (primary) hypertension: Secondary | ICD-10-CM | POA: Diagnosis not present

## 2019-03-07 DIAGNOSIS — E782 Mixed hyperlipidemia: Secondary | ICD-10-CM | POA: Diagnosis not present

## 2019-03-07 DIAGNOSIS — Z0000 Encounter for general adult medical examination without abnormal findings: Secondary | ICD-10-CM | POA: Diagnosis not present

## 2019-03-07 DIAGNOSIS — H9193 Unspecified hearing loss, bilateral: Secondary | ICD-10-CM | POA: Diagnosis not present

## 2019-03-07 DIAGNOSIS — R5383 Other fatigue: Secondary | ICD-10-CM

## 2019-03-07 NOTE — Progress Notes (Signed)
Patient: Latasha Anderson, Female    DOB: 09-17-1930, 85 y.o.   MRN: [id number] Visit Date: 03/07/2019  Today's Provider: Wilhemena Durie, MD   Chief Complaint  Patient presents with  . Annual Exam   Subjective:   Patient had AWV on 03/06/2019.   Annual physical visit Latasha Anderson is a 85 y.o. female. She feels well. She reports exercising not regularly, but she does stay active. She reports she is sleeping well.  Mammogram- 07/05/2018. Normal. Repeat 1 year.   Immunization History  Administered Date(s) Administered  . Influenza, High Dose Seasonal PF 04/23/2015, 04/28/2016, 04/27/2017, 05/16/2018  . Pneumococcal Conjugate-13 01/16/2014  . Pneumococcal Polysaccharide-23 05/13/2006  . Td 10/13/2003  . Tdap 01/16/2014      Review of Systems  Constitutional: Negative.   HENT: Positive for hearing loss.        Has chronic hearing loss.  Eyes: Negative.   Respiratory: Negative.   Cardiovascular: Negative.   Gastrointestinal: Negative.   Endocrine: Negative.   Genitourinary: Negative.   Musculoskeletal: Negative.   Skin: Negative.   Allergic/Immunologic: Negative.   Neurological: Negative.   Hematological: Negative.   Psychiatric/Behavioral: Negative.     Social History   Socioeconomic History  . Marital status: Married    Spouse name: Not on file  . Number of children: 1  . Years of education: Not on file  . Highest education level: Bachelor's degree (e.g., BA, AB, BS)  Occupational History  . Not on file  Social Needs  . Financial resource strain: Not hard at all  . Food insecurity    Worry: Never true    Inability: Never true  . Transportation needs    Medical: No    Non-medical: No  Tobacco Use  . Smoking status: Never Smoker  . Smokeless tobacco: Never Used  Substance and Sexual Activity  . Alcohol use: No    Alcohol/week: 0.0 standard drinks  . Drug use: No  . Sexual activity: Never  Lifestyle  . Physical activity    Days per  week: 0 days    Minutes per session: 0 min  . Stress: To some extent  Relationships  . Social Herbalist on phone: Patient refused    Gets together: Patient refused    Attends religious service: Patient refused    Active member of club or organization: Patient refused    Attends meetings of clubs or organizations: Patient refused    Relationship status: Patient refused  . Intimate partner violence    Fear of current or ex partner: Patient refused    Emotionally abused: Patient refused    Physically abused: Patient refused    Forced sexual activity: Patient refused  Other Topics Concern  . Not on file  Social History Narrative  . Not on file    Past Medical History:  Diagnosis Date  . Atrial fibrillation [facility])    New diagnosis, asymptomatic, February, 2014  . Breast cancer ([facility]) 1985   RT MASTECTOMY  . Chest pain   . Dyslipidemia   . Ejection fraction    EF 55-65%, January, 2012, question diastolic dysfunction  . Hypertension   . Hypokalemia    February, 2014, potassium started  . Mitral regurgitation    Mild, echo, 2012  . Shingles   . Shortness of breath    January, 0000000, normal systolic function, question diastolic dysfunction     Patient Active Problem List   Diagnosis Date Noted  .  Chronic atrial fibrillation 05/01/2015  . Atrophic vaginitis 12/15/2014  . Breast CA ([place]) 12/15/2014  . Decrease in the ability to hear 12/15/2014  . Essential (primary) hypertension 12/15/2014  . Fatigue 12/15/2014  . Acid reflux 12/15/2014  . Glaucoma 12/15/2014  . Flu vaccine need 12/15/2014  . Blood glucose elevated 12/15/2014  . Arthritis, degenerative 12/15/2014  . OP (osteoporosis) 12/15/2014  . HLD (hyperlipidemia) 12/15/2014  . Pain of upper abdomen 12/15/2014  . Encounter for therapeutic drug monitoring 09/04/2013  . Preop cardiovascular exam 03/07/2013  . Hypokalemia   . Long term (current) use of anticoagulants 08/22/2012  . Atrial fibrillation ([place])    . Dyslipidemia   . Hypertension   . Breast cancer ([place])   . Shortness of breath   . Chest pain   . Ejection fraction   . Mitral regurgitation   . SHINGLES 07/26/2010    Past Surgical History:  Procedure Laterality Date  . ABDOMINAL HYSTERECTOMY     age 15  . AUGMENTATION MAMMAPLASTY Right 1985   RT MASTECTOMY FOR BREAST CA  . EYE SURGERY     cataract  . MASTECTOMY Right 1985   reconstruction inplant as well    Her family history includes Cancer in her brother; Diabetes in her father; Stroke in her mother. There is no history of Breast cancer.   Current Outpatient Medications:  .  Acetaminophen (TYLENOL PO), Take by mouth as needed., Disp: , Rfl:  .  amLODipine-benazepril (LOTREL) 5-40 MG capsule, TAKE 1 CAPSULE BY MOUTH EVERY DAY, Disp: 30 capsule, Rfl: 1 .  atorvastatin (LIPITOR) 10 MG tablet, TAKE 1 TABLET BY MOUTH EVERY DAY, Disp: 90 tablet, Rfl: 3 .  Biotin 10000 MCG TABS, Take 1 tablet by mouth daily., Disp: , Rfl:  .  cholecalciferol (VITAMIN D) 1000 UNITS tablet, Take 1,000 Units by mouth daily., Disp: , Rfl:  .  furosemide (LASIX) 20 MG tablet, TAKE 1 TABLET BY MOUTH EVERY DAY, Disp: 90 tablet, Rfl: 3 .  latanoprost (XALATAN) 0.005 % ophthalmic solution, Place into both eyes daily. , Disp: , Rfl:  .  timolol (TIMOPTIC) 0.5 % ophthalmic solution, Place into both eyes daily. , Disp: , Rfl:  .  warfarin (COUMADIN) 2.5 MG tablet, Take 1 tablet (2.5 mg total) by mouth as directed., Disp: 100 tablet, Rfl: 0  Patient Care Team: Jerrol Banana., MD as PCP - General (Unknown Physician Specialty) Dingeldein, Remo Lipps, MD as Consulting Physician (Ophthalmology) Wellington Hampshire, MD as Consulting Physician (Cardiology) Rolm Bookbinder, MD as Consulting Physician (Dermatology)    Objective:    Vitals: BP 128/80   Pulse 70   Temp 98.2 F (36.8 C)   Resp 16   Ht 5\' 6"  (1.676 m)   Wt 133 lb (60.3 kg)   SpO2 99%   BMI 21.47 kg/m   Physical Exam Vitals signs  reviewed. Exam conducted with a chaperone present (Right breast implant in place.).  Constitutional:      Appearance: She is well-developed.  HENT:     Head: Normocephalic and atraumatic.     Right Ear: External ear normal.     Left Ear: External ear normal.     Nose: Nose normal.  Eyes:     General: No scleral icterus.    Conjunctiva/sclera: Conjunctivae normal.  Neck:     Thyroid: No thyromegaly.  Cardiovascular:     Rate and Rhythm: Normal rate and regular rhythm.     Heart sounds: Normal heart sounds.  Pulmonary:  Effort: Pulmonary effort is normal.     Breath sounds: Normal breath sounds.  Chest:     Breasts:        Left: Normal.  Abdominal:     Palpations: Abdomen is soft.  Musculoskeletal:     Comments: 1+ edema.  Skin:    General: Skin is warm and dry.  Neurological:     Mental Status: She is alert and oriented to person, place, and time.  Psychiatric:        Behavior: Behavior normal.        Thought Content: Thought content normal.        Judgment: Judgment normal.     Activities of Daily Living In your present state of health, do you have any difficulty performing the following activities: 03/06/2019  Hearing? Y  Comment Wears hearing aids.  Vision? N  Difficulty concentrating or making decisions? N  Walking or climbing stairs? N  Dressing or bathing? N  Doing errands, shopping? N  Preparing Food and eating ? N  Using the Toilet? N  In the past six months, have you accidently leaked urine? N  Do you have problems with loss of bowel control? N  Managing your Medications? N  Managing your Finances? N  Housekeeping or managing your Housekeeping? N  Some recent data might be hidden    Fall Risk Assessment Fall Risk  03/06/2019 03/01/2018 11/24/2016 02/12/2015  Falls in the past year? 1 No No No  Number falls in past yr: 0 - - -  Injury with Fall? 0 - - -  Follow up Falls prevention discussed - - -     Depression Screen PHQ 2/9 Scores 03/06/2019  03/01/2018 03/01/2018 11/24/2016  PHQ - 2 Score 1 0 0 0  PHQ- 9 Score - 0 - 3    6CIT Screen 11/24/2016  What Year? 0 points  What month? 0 points  What time? 0 points  Count back from 20 0 points  Months in reverse 0 points  Repeat phrase 2 points  Total Score 2      Assessment & Plan:     Annual Wellness Visit  Reviewed patient's Family Medical History Reviewed and updated list of patient's medical providers Assessment of cognitive impairment was done Assessed patient's functional ability Established a written schedule for health screening [place] Completed and Reviewed  Exercise Activities and Dietary recommendations Goals    . Increase water intake     Recommend increasing water intake to 4-6 glasses a day.    Marland Kitchen LIFESTYLE - DECREASE FALLS RISK     Recommend to remove any items from the home that may cause slips or trips.       Immunization History  Administered Date(s) Administered  . Influenza, High Dose Seasonal PF 04/23/2015, 04/28/2016, 04/27/2017, 05/16/2018  . Pneumococcal Conjugate-13 01/16/2014  . Pneumococcal Polysaccharide-23 05/13/2006  . Td 10/13/2003  . Tdap 01/16/2014    Health Maintenance  Topic Date Due  . INFLUENZA VACCINE  02/02/2019  . DEXA SCAN  07/04/2026 (Originally 05/14/2006)  . TETANUS/TDAP  01/17/2024  . PNA vac Low Risk Adult  Completed     Discussed health benefits of physical activity, and encouraged her to engage in regular exercise appropriate for her age and condition.  1. Annual physical exam   2. Essential (primary) hypertension  - CBC with Differential/Platelet - Comprehensive metabolic panel  3. Mixed hyperlipidemia  - Lipid panel  4. Decreased hearing of both ears   5.  Other fatigue  - TSH    I, Rachelle L. Presley, CMA, am acting as a Education administrator for [employer]. Rosanna Randy, [place]    Wilhemena Durie, MD  [facility]

## 2019-03-08 LAB — CBC WITH DIFFERENTIAL/PLATELET
Basophils Absolute: 0 10*3/uL (ref 0.0–0.2)
Basos: 0 %
EOS (ABSOLUTE): 0.2 10*3/uL (ref 0.0–0.4)
Eos: 3 %
Hematocrit: 47 % — ABNORMAL HIGH (ref 34.0–46.6)
Hemoglobin: 15.6 g/dL (ref 11.1–15.9)
Immature Grans (Abs): 0 10*3/uL (ref 0.0–0.1)
Immature Granulocytes: 0 %
Lymphocytes Absolute: 2 10*3/uL (ref 0.7–3.1)
Lymphs: 26 %
MCH: 31.1 pg (ref 26.6–33.0)
MCHC: 33.2 g/dL (ref 31.5–35.7)
MCV: 94 fL (ref 79–97)
Monocytes Absolute: 0.6 10*3/uL (ref 0.1–0.9)
Monocytes: 7 %
Neutrophils Absolute: 5.1 10*3/uL (ref 1.4–7.0)
Neutrophils: 64 %
Platelets: 274 10*3/uL (ref 150–450)
RBC: 5.02 x10E6/uL (ref 3.77–5.28)
RDW: 12.3 % (ref 11.7–15.4)
WBC: 8 10*3/uL (ref 3.4–10.8)

## 2019-03-08 LAB — COMPREHENSIVE METABOLIC PANEL
ALT: 12 IU/L (ref 0–32)
AST: 17 IU/L (ref 0–40)
Albumin/Globulin Ratio: 2.1 (ref 1.2–2.2)
Albumin: 4.5 g/dL (ref 3.6–4.6)
Alkaline Phosphatase: 78 IU/L (ref 39–117)
BUN/Creatinine Ratio: 13 (ref 12–28)
BUN: 8 mg/dL (ref 8–27)
Bilirubin Total: 1 mg/dL (ref 0.0–1.2)
CO2: 25 mmol/L (ref 20–29)
Calcium: 9.5 mg/dL (ref 8.7–10.3)
Chloride: 97 mmol/L (ref 96–106)
Creatinine, Ser: 0.64 mg/dL (ref 0.57–1.00)
GFR calc Af Amer: 93 mL/min/{1.73_m2} (ref >59)
GFR calc non Af Amer: 81 mL/min/{1.73_m2} (ref >59)
Globulin, Total: 2.1 g/dL (ref 1.5–4.5)
Glucose: 95 mg/dL (ref 65–99)
Potassium: 3.9 mmol/L (ref 3.5–5.2)
Sodium: 137 mmol/L (ref 134–144)
Total Protein: 6.6 g/dL (ref 6.0–8.5)

## 2019-03-08 LAB — TSH: TSH: 2.65 u[IU]/mL (ref 0.450–4.500)

## 2019-03-08 LAB — LIPID PANEL
Chol/HDL Ratio: 2.8 ratio (ref 0.0–4.4)
Cholesterol, Total: 165 mg/dL (ref 100–199)
HDL: 59 mg/dL (ref >39)
LDL Chol Calc (NIH): 84 mg/dL (ref 0–99)
Triglycerides: 126 mg/dL (ref 0–149)
VLDL Cholesterol Cal: 22 mg/dL (ref 5–40)

## 2019-03-12 ENCOUNTER — Telehealth: Payer: Self-pay

## 2019-03-12 NOTE — Telephone Encounter (Signed)
-----   Message from Jerrol Banana., MD sent at 03/08/2019  8:52 AM EDT ----- Labs good.

## 2019-03-12 NOTE — Telephone Encounter (Signed)
Patient advised as below.  

## 2019-03-23 ENCOUNTER — Other Ambulatory Visit: Payer: Self-pay | Admitting: Cardiovascular Disease

## 2019-03-25 NOTE — Telephone Encounter (Signed)
scheduled with Arida on 11/6, refused an APP

## 2019-03-25 NOTE — Telephone Encounter (Signed)
Please review for refill.  

## 2019-03-25 NOTE — Telephone Encounter (Signed)
Please schedule 12 mo F/U with Dr. Fletcher Anon.

## 2019-03-27 ENCOUNTER — Other Ambulatory Visit: Payer: Self-pay

## 2019-03-27 ENCOUNTER — Ambulatory Visit (INDEPENDENT_AMBULATORY_CARE_PROVIDER_SITE_OTHER): Payer: Medicare Other

## 2019-03-27 DIAGNOSIS — I4891 Unspecified atrial fibrillation: Secondary | ICD-10-CM | POA: Diagnosis not present

## 2019-03-27 DIAGNOSIS — Z7901 Long term (current) use of anticoagulants: Secondary | ICD-10-CM | POA: Diagnosis not present

## 2019-03-27 DIAGNOSIS — Z5181 Encounter for therapeutic drug level monitoring: Secondary | ICD-10-CM

## 2019-03-27 LAB — POCT INR: INR: 2.8 (ref 2.0–3.0)

## 2019-03-27 NOTE — Patient Instructions (Signed)
Continue taking same dosage 1 tablet everyday except 1/2 tablet on Wednesdays.  Recheck in 6 weeks. 

## 2019-04-01 ENCOUNTER — Ambulatory Visit
Admission: RE | Admit: 2019-04-01 | Discharge: 2019-04-01 | Disposition: A | Discharge status: Home / Self Care | Payer: Medicare Other | Source: Ambulatory Visit | Attending: Family Medicine | Admitting: Family Medicine

## 2019-04-01 DIAGNOSIS — Z853 Personal history of malignant neoplasm of breast: Secondary | ICD-10-CM | POA: Diagnosis not present

## 2019-04-01 DIAGNOSIS — Z13820 Encounter for screening for osteoporosis: Secondary | ICD-10-CM | POA: Diagnosis not present

## 2019-04-01 DIAGNOSIS — Z780 Asymptomatic menopausal state: Secondary | ICD-10-CM | POA: Diagnosis not present

## 2019-04-02 ENCOUNTER — Telehealth: Payer: Self-pay

## 2019-04-02 MED ORDER — ALENDRONATE SODIUM 70 MG PO TABS: 70.0000 mg | ORAL_TABLET | ORAL | 11 refills | Status: DC

## 2019-04-02 NOTE — Telephone Encounter (Signed)
-----   Message from Jerrol Banana., MD sent at 04/02/2019  1:11 PM EDT ----- Osteoporotic.  Has patient never been on Fosamax/alendronate?  If not would start at 70 mg weekly.

## 2019-04-02 NOTE — Telephone Encounter (Signed)
Patient advised. She agrees to try Fosamax. RX sent to [facility] pharmacy.

## 2019-04-19 ENCOUNTER — Other Ambulatory Visit: Payer: Self-pay | Admitting: Family Medicine

## 2019-04-19 DIAGNOSIS — Z1231 Encounter for screening mammogram for malignant neoplasm of breast: Secondary | ICD-10-CM

## 2019-04-25 ENCOUNTER — Other Ambulatory Visit: Payer: Self-pay | Admitting: Cardiovascular Disease

## 2019-05-08 ENCOUNTER — Ambulatory Visit (INDEPENDENT_AMBULATORY_CARE_PROVIDER_SITE_OTHER): Payer: Medicare Other

## 2019-05-08 ENCOUNTER — Other Ambulatory Visit: Payer: Self-pay

## 2019-05-08 DIAGNOSIS — Z5181 Encounter for therapeutic drug level monitoring: Secondary | ICD-10-CM

## 2019-05-08 DIAGNOSIS — I4891 Unspecified atrial fibrillation: Secondary | ICD-10-CM

## 2019-05-08 DIAGNOSIS — Z7901 Long term (current) use of anticoagulants: Secondary | ICD-10-CM | POA: Diagnosis not present

## 2019-05-08 LAB — POCT INR: INR: 2.4 (ref 2.0–3.0)

## 2019-05-08 NOTE — Patient Instructions (Signed)
Continue taking same dosage 1 tablet everyday except 1/2 tablet on Wednesdays.  Recheck in 6 weeks. 

## 2019-05-10 ENCOUNTER — Encounter: Payer: Self-pay | Admitting: Cardiovascular Disease

## 2019-05-10 ENCOUNTER — Other Ambulatory Visit: Payer: Self-pay

## 2019-05-10 ENCOUNTER — Ambulatory Visit (INDEPENDENT_AMBULATORY_CARE_PROVIDER_SITE_OTHER): Payer: Medicare Other | Admitting: Cardiovascular Disease

## 2019-05-10 VITALS — BP 110/70 | Ht 66.0 in | Wt 136.0 lb

## 2019-05-10 DIAGNOSIS — I5032 Chronic diastolic (congestive) heart failure: Secondary | ICD-10-CM | POA: Diagnosis not present

## 2019-05-10 DIAGNOSIS — I482 Chronic atrial fibrillation, unspecified: Secondary | ICD-10-CM | POA: Diagnosis not present

## 2019-05-10 DIAGNOSIS — I10 Essential (primary) hypertension: Secondary | ICD-10-CM | POA: Diagnosis not present

## 2019-05-10 DIAGNOSIS — E782 Mixed hyperlipidemia: Secondary | ICD-10-CM

## 2019-05-10 NOTE — Progress Notes (Signed)
Cardiology Office Note   Date:  05/10/2019   ID:  Latasha Anderson, Latasha Anderson Jun 07, 1931, MRN [id number]  PCP:  Jerrol Banana., MD  Cardiologist:   Kathlyn Sacramento, MD   Chief Complaint  Patient presents with  . Other    12 month follow up. patient c/o some swelling in Right leg. Meds reviewed verbally with patient.       History of Present Illness: Latasha Anderson is a 85 y.o. female who presents for a follow-up visit regarding chronic atrial fibrillation and chronic diastolic heart failure.  She has known history of chronic atrial fibrillation on long-term anticoagulation with warfarin, hypertension and hyperlipidemia. She has no history of ischemic heart disease.  Most recent echocardiogram in October 2019 showed normal LV systolic function with mildly dilated left atrium and mild pulmonary hypertension.  She has been doing well with no recent chest pain, shortness of breath or palpitations.  She has mild bilateral leg edema. The patient did not remember seeing me in this clinic and she has been my patient for a while.  Her husband reports that she has been having some memory issues as well as visual hallucinations.    Past Medical History:  Diagnosis Date  . Atrial fibrillation [facility])    New diagnosis, asymptomatic, February, 2014  . Breast cancer ([place]) 1985   RT MASTECTOMY  . Chest pain   . Dyslipidemia   . Ejection fraction    EF 55-65%, January, 2012, question diastolic dysfunction  . Hypertension   . Hypokalemia    February, 2014, potassium started  . Mitral regurgitation    Mild, echo, 2012  . Shingles   . Shortness of breath    January, 0000000, normal systolic function, question diastolic dysfunction    Past Surgical History:  Procedure Laterality Date  . ABDOMINAL HYSTERECTOMY     age 32  . AUGMENTATION MAMMAPLASTY Right 1985   RT MASTECTOMY FOR BREAST CA  . EYE SURGERY     cataract  . MASTECTOMY Right 1985   reconstruction inplant as well      Current Outpatient Medications  Medication Sig Dispense Refill  . Acetaminophen (TYLENOL PO) Take by mouth as needed.    Marland Kitchen alendronate (FOSAMAX) 70 MG tablet Take 1 tablet (70 mg total) by mouth every 7 (seven) days. Take with a full glass of water on an empty stomach. 4 tablet 11  . amLODipine-benazepril (LOTREL) 5-40 MG capsule TAKE 1 CAPSULE BY MOUTH EVERY DAY 30 capsule 0  . atorvastatin (LIPITOR) 10 MG tablet TAKE 1 TABLET BY MOUTH EVERY DAY 90 tablet 3  . Biotin 10000 MCG TABS Take 1 tablet by mouth daily.    . cholecalciferol (VITAMIN D) 1000 UNITS tablet Take 1,000 Units by mouth daily.    . furosemide (LASIX) 20 MG tablet TAKE 1 TABLET BY MOUTH EVERY DAY 90 tablet 3  . latanoprost (XALATAN) 0.005 % ophthalmic solution Place into both eyes daily.     . timolol (TIMOPTIC) 0.5 % ophthalmic solution Place into both eyes daily.     Marland Kitchen warfarin (COUMADIN) 2.5 MG tablet Take 1 tablet (2.5 mg total) by mouth as directed. 100 tablet 0   No current facility-administered medications for this visit.     Allergies:   Ciprofloxacin, Cortisone, Nitrofurantoin monohyd macro, and Other    Social History:  The patient  reports that she has never smoked. She has never used smokeless tobacco. She reports that she does not drink alcohol or  use drugs.   Family History:  The patient's family history includes Cancer in her brother; Diabetes in her father; Stroke in her mother.      PHYSICAL EXAM: VS:  BP 110/70 (BP Location: Left Arm, Patient Position: Sitting, Cuff Size: Normal)   Ht 5\' 6"  (1.676 m)   Wt 136 lb (61.7 kg)   BMI 21.95 kg/m  , BMI Body mass index is 21.95 kg/m. GEN: Well nourished, well developed, in no acute distress  HEENT: normal  Neck: no JVD, carotid bruits, or masses Cardiac: Irregularly irregular; no murmurs, rubs, or gallops,no edema  Respiratory:  clear to auscultation bilaterally, normal work of breathing GI: soft, nontender, nondistended, + BS MS: no deformity  or atrophy  Skin: warm and dry, no rash Neuro:  Strength and sensation are intact Psych: euthymic mood, full affect   EKG:  EKG is ordered today. The ekg ordered today demonstrates : Atrial fibrillation with nonspecific ST or T wave changes.  Ventricular rate of 74 bpm.   Recent Labs: 03/07/2019: ALT 12; BUN 8; Creatinine, Ser 0.64; Hemoglobin 15.6; Platelets 274; Potassium 3.9; Sodium 137; TSH 2.650    Lipid Panel    Component Value Date/Time   CHOL 165 03/07/2019 1058   TRIG 126 03/07/2019 1058   HDL 59 03/07/2019 1058   CHOLHDL 2.8 03/07/2019 1058   CHOLHDL 3.3 06/19/2017 0821   LDLCALC 84 03/07/2019 1058   LDLCALC 89 06/19/2017 0821      Wt Readings from Last 3 Encounters:  05/10/19 136 lb (61.7 kg)  03/07/19 133 lb (60.3 kg)  03/29/18 133 lb 8 oz (60.6 kg)         ASSESSMENT AND PLAN:  1.  Chronic atrial fibrillation: Ventricular rate is well controlled without medications. She is tolerating anticoagulation with warfarin with therapeutic INR.  Recent labs in September showed normal CBC and renal function.  2.  Chronic diastolic heart failure: She appears to be euvolemic on small dose furosemide.  Echocardiogram last year showed normal LV systolic function  3.  Essential hypertension: Blood pressure is well controlled on current medications.  4.  Memory decline: Suspect early dementia.  I will forward this to Dr. Rosanna Randy.    Disposition:   FU with me  6 months  Signed, Kathlyn Sacramento, MD  05/10/2019 10:19 AM    [place]

## 2019-05-10 NOTE — Patient Instructions (Signed)
Medication Instructions:  Your physician recommends that you continue on your current medications as directed. Please refer to the Current Medication list given to you today.  *If you need a refill on your cardiac medications before your next appointment, please call your pharmacy*  Lab Work: None ordered If you have labs (blood work) drawn today and your tests are completely normal, you will receive your results only by: . MyChart Message (if you have MyChart) OR . A paper copy in the mail If you have any lab test that is abnormal or we need to change your treatment, we will call you to review the results.  Testing/Procedures: None ordered  Follow-Up: At [facility], you and your health needs are our priority.  As part of our continuing mission to provide you with exceptional heart care, we have created designated Provider Care Teams.  These Care Teams include your primary Cardiologist (physician) and Advanced Practice Providers (APPs -  Physician Assistants and Nurse Practitioners) who all work together to provide you with the care you need, when you need it.  Your next appointment:   6 month(s)  The format for your next appointment:   In Person  Provider:    You may see Dr. Arida or one of the following Advanced Practice Providers on your designated Care Team:    Christopher Berge, NP  Ryan Dunn, PA-C  Jacquelyn Visser, PA-C   Other Instructions N/A  

## 2019-05-20 ENCOUNTER — Other Ambulatory Visit: Payer: Self-pay | Admitting: Family Medicine

## 2019-05-20 ENCOUNTER — Other Ambulatory Visit: Payer: Self-pay | Admitting: Cardiovascular Disease

## 2019-05-20 NOTE — Telephone Encounter (Signed)
Please review for refill on Warfarin. Thanks!  

## 2019-06-14 ENCOUNTER — Other Ambulatory Visit: Payer: Self-pay | Admitting: Cardiovascular Disease

## 2019-06-14 NOTE — Telephone Encounter (Signed)
Refill Request.  

## 2019-06-19 ENCOUNTER — Other Ambulatory Visit: Payer: Self-pay

## 2019-06-19 ENCOUNTER — Ambulatory Visit (INDEPENDENT_AMBULATORY_CARE_PROVIDER_SITE_OTHER): Payer: Medicare Other

## 2019-06-19 DIAGNOSIS — Z7901 Long term (current) use of anticoagulants: Secondary | ICD-10-CM

## 2019-06-19 DIAGNOSIS — Z5181 Encounter for therapeutic drug level monitoring: Secondary | ICD-10-CM

## 2019-06-19 DIAGNOSIS — I4891 Unspecified atrial fibrillation: Secondary | ICD-10-CM | POA: Diagnosis not present

## 2019-06-19 LAB — POCT INR: INR: 2 (ref 2.0–3.0)

## 2019-06-19 NOTE — Patient Instructions (Signed)
Please continue current dosage of warfarin 2.5 mg every day EXCEPT 1.25 mg on WEDNESDAYS. Recheck in 6 weeks. 

## 2019-06-20 ENCOUNTER — Ambulatory Visit (INDEPENDENT_AMBULATORY_CARE_PROVIDER_SITE_OTHER): Payer: Medicare Other | Admitting: Family Medicine

## 2019-06-20 DIAGNOSIS — Z23 Encounter for immunization: Secondary | ICD-10-CM

## 2019-06-20 NOTE — Progress Notes (Addendum)
Patient received her Flu Vacc only. Husband says pt starting to have memory issues and some visual hallucination. Consider Lewy body dementia on next visit.

## 2019-06-20 NOTE — Addendum Note (Signed)
Addended by: Eulas Post on: 06/20/2019 06:17 PM   Modules accepted: Level of Service

## 2019-07-11 ENCOUNTER — Ambulatory Visit
Admission: RE | Admit: 2019-07-11 | Discharge: 2019-07-11 | Disposition: A | Discharge status: Home / Self Care | Payer: Medicare PPO | Source: Ambulatory Visit | Attending: Family Medicine | Admitting: Family Medicine

## 2019-07-11 DIAGNOSIS — Z1231 Encounter for screening mammogram for malignant neoplasm of breast: Secondary | ICD-10-CM | POA: Diagnosis present

## 2019-07-31 ENCOUNTER — Other Ambulatory Visit: Payer: Self-pay

## 2019-07-31 ENCOUNTER — Ambulatory Visit (INDEPENDENT_AMBULATORY_CARE_PROVIDER_SITE_OTHER): Payer: Medicare PPO

## 2019-07-31 DIAGNOSIS — Z7901 Long term (current) use of anticoagulants: Secondary | ICD-10-CM

## 2019-07-31 DIAGNOSIS — Z5181 Encounter for therapeutic drug level monitoring: Secondary | ICD-10-CM | POA: Diagnosis not present

## 2019-07-31 DIAGNOSIS — I4891 Unspecified atrial fibrillation: Secondary | ICD-10-CM | POA: Diagnosis not present

## 2019-07-31 LAB — POCT INR: INR: 2.2 (ref 2.0–3.0)

## 2019-07-31 NOTE — Patient Instructions (Signed)
Please continue current dosage of warfarin 2.5 mg every day EXCEPT 1.25 mg on WEDNESDAYS. Recheck in 6 weeks. 

## 2019-09-11 ENCOUNTER — Other Ambulatory Visit: Payer: Self-pay

## 2019-09-11 ENCOUNTER — Ambulatory Visit (INDEPENDENT_AMBULATORY_CARE_PROVIDER_SITE_OTHER): Payer: Medicare PPO

## 2019-09-11 DIAGNOSIS — Z7901 Long term (current) use of anticoagulants: Secondary | ICD-10-CM | POA: Diagnosis not present

## 2019-09-11 DIAGNOSIS — I4891 Unspecified atrial fibrillation: Secondary | ICD-10-CM | POA: Diagnosis not present

## 2019-09-11 DIAGNOSIS — Z5181 Encounter for therapeutic drug level monitoring: Secondary | ICD-10-CM

## 2019-09-11 LAB — POCT INR: INR: 2.2 (ref 2.0–3.0)

## 2019-09-11 NOTE — Patient Instructions (Signed)
Please continue current dosage of warfarin 2.5 mg every day EXCEPT 1.25 mg on WEDNESDAYS. Recheck in 6 weeks. 

## 2019-10-23 ENCOUNTER — Ambulatory Visit (INDEPENDENT_AMBULATORY_CARE_PROVIDER_SITE_OTHER): Payer: Medicare PPO

## 2019-10-23 ENCOUNTER — Other Ambulatory Visit: Payer: Self-pay

## 2019-10-23 DIAGNOSIS — Z7901 Long term (current) use of anticoagulants: Secondary | ICD-10-CM

## 2019-10-23 DIAGNOSIS — I4891 Unspecified atrial fibrillation: Secondary | ICD-10-CM

## 2019-10-23 DIAGNOSIS — Z5181 Encounter for therapeutic drug level monitoring: Secondary | ICD-10-CM | POA: Diagnosis not present

## 2019-10-23 LAB — POCT INR: INR: 2.5 (ref 2.0–3.0)

## 2019-10-23 NOTE — Patient Instructions (Signed)
Please continue current dosage of warfarin 2.5 mg every day EXCEPT 1.25 mg on WEDNESDAYS. Recheck in 6 weeks. 

## 2019-11-25 ENCOUNTER — Telehealth: Payer: Self-pay | Admitting: Cardiovascular Disease

## 2019-11-25 ENCOUNTER — Other Ambulatory Visit: Payer: Self-pay | Admitting: Cardiovascular Disease

## 2019-11-25 NOTE — Telephone Encounter (Signed)
   [facility] Pre-operative Risk Assessment    HEARTCARE STAFF: - Please ensure there is not already an duplicate clearance open for this procedure. - Under Visit Info/Reason for Call, type in Other and utilize the format Clearance MM/DD/YY or Clearance TBD. Do not use dashes or single digits. - If request is for dental extraction, please clarify the # of teeth to be extracted.  Request for surgical clearance:  1. What type of surgery is being performed? Root canal /possible extraction , 1 tooth  2. When is this surgery scheduled?TBD  3. What type of clearance is required (medical clearance vs. Pharmacy clearance to hold med vs. Both)? both  4. Are there any medications that need to be held prior to surgery and how long? coumadin  5. Practice name and name of physician performing surgery? Dr. Anna Genre  6. What is the office phone number? [phone]   7.   What is the office fax number? [phone] as phone number)  8.   Anesthesia type (None, local, MAC, general) ? No anesthesia   Latasha Anderson 11/25/2019, 11:33 AM  _________________________________________________________________   (provider comments below)

## 2019-11-25 NOTE — Telephone Encounter (Signed)
I s/w Shae with Dr. Cristie Hem Poole's office and confirmed the procedure is a root canal. At this point not teeth to be extracted, though if unable to do root canal the tooth to be done then this may require the  tootht be extracted.

## 2019-11-26 NOTE — Telephone Encounter (Signed)
Recommend continuing warfarin for single root canal and/or extraction.

## 2019-11-26 NOTE — Telephone Encounter (Signed)
   Primary Cardiologist: Kathlyn Sacramento, MD  Chart reviewed as part of pre-operative protocol coverage. Simple dental extractions are considered low risk procedures per guidelines and generally do not require any specific cardiac clearance. It is also generally accepted that for simple extractions and dental cleanings, there is no need to interrupt blood thinner therapy.   SBE prophylaxis is not required for the patient.  I will route this recommendation to the requesting party via Epic fax function and remove from pre-op pool.  Please call with questions.  Deberah Pelton, NP 11/26/2019, 1:05 PM

## 2019-12-04 ENCOUNTER — Other Ambulatory Visit: Payer: Self-pay

## 2019-12-04 ENCOUNTER — Ambulatory Visit (INDEPENDENT_AMBULATORY_CARE_PROVIDER_SITE_OTHER): Payer: Medicare PPO

## 2019-12-04 DIAGNOSIS — Z7901 Long term (current) use of anticoagulants: Secondary | ICD-10-CM

## 2019-12-04 DIAGNOSIS — Z5181 Encounter for therapeutic drug level monitoring: Secondary | ICD-10-CM

## 2019-12-04 DIAGNOSIS — I4891 Unspecified atrial fibrillation: Secondary | ICD-10-CM

## 2019-12-04 LAB — POCT INR: INR: 2 (ref 2.0–3.0)

## 2019-12-04 NOTE — Patient Instructions (Signed)
Please continue current dosage of warfarin 2.5 mg every day EXCEPT 1.25 mg on WEDNESDAYS. Recheck in 6 weeks. 

## 2019-12-05 ENCOUNTER — Ambulatory Visit (INDEPENDENT_AMBULATORY_CARE_PROVIDER_SITE_OTHER): Payer: Medicare PPO | Admitting: Nurse Practitioner

## 2019-12-05 ENCOUNTER — Encounter: Payer: Self-pay | Admitting: Nurse Practitioner

## 2019-12-05 VITALS — BP 122/64 | HR 83 | Ht 66.0 in | Wt 136.2 lb

## 2019-12-05 DIAGNOSIS — I4821 Permanent atrial fibrillation: Secondary | ICD-10-CM

## 2019-12-05 DIAGNOSIS — I5032 Chronic diastolic (congestive) heart failure: Secondary | ICD-10-CM

## 2019-12-05 DIAGNOSIS — E782 Mixed hyperlipidemia: Secondary | ICD-10-CM

## 2019-12-05 DIAGNOSIS — I10 Essential (primary) hypertension: Secondary | ICD-10-CM | POA: Diagnosis not present

## 2019-12-05 NOTE — Progress Notes (Signed)
Office Visit    Patient Name: Latasha Anderson Date of Encounter: 12/05/2019  Primary Care Provider:  Jerrol Banana., MD Primary Cardiologist:  Kathlyn Sacramento, MD  Chief Complaint    86 year old female with a history of permanent atrial fibrillation, HFpEF, hypertension, and hyperlipidemia, who presents for follow-up of atrial fibrillation.  Past Medical History    Past Medical History:  Diagnosis Date  . (HFpEF) heart failure with preserved ejection fraction ([place])    a. EF 55-65%, January, 2012, question diastolic dysfunction; b. [id number] Echo: EF 55-60%, no rwma. Mild MR. Mildly dil LA. Nl RV fxn. PASP 77mmHg.  . Breast cancer ([place]) 1985   RT MASTECTOMY  . Chest pain   . Dyslipidemia   . Hypertension   . Hypokalemia    February, 2014, potassium started  . Mitral regurgitation    Mild, echo, 2012  . Permanent atrial fibrillation ([place])    a. Dx 08/2012. [id number] = 4-5-->warfarin.  . Shingles   . Shortness of breath    January, 0000000, normal systolic function, question diastolic dysfunction   Past Surgical History:  Procedure Laterality Date  . ABDOMINAL HYSTERECTOMY     age 63  . AUGMENTATION MAMMAPLASTY Right 1985   RT MASTECTOMY FOR BREAST CA  . EYE SURGERY     cataract  . MASTECTOMY Right 1985   reconstruction inplant as well   Allergies  Allergies  Allergen Reactions  . Ciprofloxacin Nausea And Vomiting  . Cortisone     Pt stated this makes her turn blue  . Nitrofurantoin Monohyd Macro     GI upset & chills.  . Other     Mycins    History of Present Illness    86 year old female with above past medical history including permanent atrial fibrillation initially diagnosed in February 2014, on chronic warfarin, HFpEF, hypertension, and hyperlipidemia.  Previous echocardiogram in October 2019 showed normal LV function with mild delayed left atrium, and mild pulmonary hypertension.  She was last seen in clinic in November 2020, at which time  she noted mild bilateral lower extremity swelling though was overall felt to be euvolemic on examination and continued on low-dose Lasix.  Since then, she has done reasonably well.  She has had persistent mild to moderate right lower extremity swelling as well as dyspnea on exertion.  She does not think that dyspnea has changed very much.  Her husband notes, that she has been relatively sedentary, sitting for large portions of the day.  She does wear socks but these only come up to her mid calf and result in swelling above the sock line.  She notes that she was previously advised to wear compression sock but has not purchased any.  She denies chest pain, palpitations, PND, orthopnea, dizziness, syncope, or early satiety.  Home Medications    Prior to Admission medications   Medication Sig Start Date End Date Taking? Authorizing Provider  Acetaminophen (TYLENOL PO) Take by mouth as needed.    [provider]  alendronate (FOSAMAX) 70 MG tablet Take 1 tablet (70 mg total) by mouth every 7 (seven) days. Take with a full glass of water on an empty stomach. 04/02/19   Jerrol Banana., MD  amLODipine-benazepril (LOTREL) 5-40 MG capsule TAKE 1 CAPSULE BY MOUTH EVERY DAY 11/25/19   Wellington Hampshire, MD  atorvastatin (LIPITOR) 10 MG tablet TAKE 1 TABLET BY MOUTH EVERY DAY 05/20/19   Jerrol Banana., MD  Biotin 10000 MCG TABS  Take 1 tablet by mouth daily.    [provider]  cholecalciferol (VITAMIN D) 1000 UNITS tablet Take 1,000 Units by mouth daily.    [provider]  furosemide (LASIX) 20 MG tablet TAKE 1 TABLET BY MOUTH EVERY DAY 02/25/19   Wellington Hampshire, MD  latanoprost (XALATAN) 0.005 % ophthalmic solution Place into both eyes daily.  08/01/12   [provider]  timolol (TIMOPTIC) 0.5 % ophthalmic solution Place into both eyes daily.  08/05/12   [provider]  warfarin (COUMADIN) 2.5 MG tablet TAKE 1 TABLET (2.5 MG TOTAL) BY MOUTH AS DIRECTED.  06/14/19   Wellington Hampshire, MD    Review of Systems    Chronic, stable dyspnea on exertion as well as right greater than left lower extremity swelling.  She denies chest pain, palpitations, PND, orthopnea, dizziness, syncope, or early satiety.  All other systems reviewed and are otherwise negative except as noted above.  Physical Exam    VS:  BP 122/64 (BP Location: Left Arm, Patient Position: Sitting, Cuff Size: Normal)   Pulse 83   Ht 5\' 6"  (1.676 m)   Wt 136 lb 4 oz (61.8 kg)   SpO2 98%   BMI 21.99 kg/m  , BMI Body mass index is 21.99 kg/m. GEN: Well nourished, well developed, in no acute distress. HEENT: normal. Neck: Supple, no JVD, carotid bruits, or masses. Cardiac: Irregularly irregular, no murmurs, rubs, or gallops. No clubbing, cyanosis.  1+ right lower extremity edema to the mid calf, trace left lower extremity edema.  Radials/PT 1+ and equal bilaterally.  Respiratory:  Respirations regular and unlabored, clear to auscultation bilaterally. GI: Soft, nontender, nondistended, BS + x 4. MS: no deformity or atrophy. Skin: warm and dry, no rash. Neuro:  Strength and sensation are intact. Psych: Normal affect.  Accessory Clinical Findings    ECG personally reviewed by me today -atrial fibrillation, 83, lateral ST and T abnormalities - no acute changes.  Lab Results  Component Value Date   WBC 8.0 03/07/2019   HGB 15.6 03/07/2019   HCT 47.0 (H) 03/07/2019   MCV 94 03/07/2019   PLT 274 03/07/2019   Lab Results  Component Value Date   CREATININE 0.64 03/07/2019   BUN 8 03/07/2019   NA 137 03/07/2019   K 3.9 03/07/2019   CL 97 03/07/2019   CO2 25 03/07/2019   Lab Results  Component Value Date   ALT 12 03/07/2019   AST 17 03/07/2019   ALKPHOS 78 03/07/2019   BILITOT 1.0 03/07/2019   Lab Results  Component Value Date   CHOL 165 03/07/2019   HDL 59 03/07/2019   LDLCALC 84 03/07/2019   TRIG 126 03/07/2019   CHOLHDL 2.8 03/07/2019    Lab Results   Component Value Date   HGBA1C 5.6 06/06/2016   Lab Results  Component Value Date   INR 2.0 12/04/2019   INR 2.5 10/23/2019   INR 2.2 09/11/2019    Assessment & Plan    1.  HFpEF: EF of 55-60% by echo in October 2019.  She is chronic, stable dyspnea on exertion and also chronic, stable right greater than left lower extremity swelling.  She is using furosemide 20 mg daily.  We discussed the importance of keeping her legs elevated when possible and also wearing a compression stockings.  She does plan to purchase these and her husband said he will encourage her to keep her legs elevated more frequently.  We also discussed the importance  of limiting exogenous salt intake.  They do partake in processed foods relatively frequently given ease of preparation.  Heart rate and blood pressure stable.  2.  Right lower extremity swelling: Right greater than left lower extremity swelling in the setting of above.  I suspect a component of venous insufficiency.  Patient husband say the right side has always been a little bit more swollen than the left.  Compression socks and elevation as outlined above.  3.  Permanent atrial fibrillation: Well rate controlled in the absence of AV nodal blocking agent.  She is anticoagulated with warfarin with recent therapeutic INR.  4.  Essential hypertension: Stable on amlodipine/benazepril.  5.  Hyperlipidemia: LDL of 84 last September.  She remains on statin therapy.  6.  Memory decline: Husband has noticed memory decline though overall this appears to be stable since her last visit in November.  7.  Disposition: Follow-up in clinic in 6 months or sooner if necessary.  Murray Hodgkins, NP 12/05/2019, 5:51 PM

## 2019-12-05 NOTE — Patient Instructions (Signed)
Medication Instructions:  Your physician recommends that you continue on your current medications as directed. Please refer to the Current Medication list given to you today.  *If you need a refill on your cardiac medications before your next appointment, please call your pharmacy*   Lab Work: None ordered  If you have labs (blood work) drawn today and your tests are completely normal, you will receive your results only by: Marland Kitchen MyChart Message (if you have MyChart) OR . A paper copy in the mail If you have any lab test that is abnormal or we need to change your treatment, we will call you to review the results.   Testing/Procedures: None ordered    Follow-Up: At [facility], you and your health needs are our priority.  As part of our continuing mission to provide you with exceptional heart care, we have created designated Provider Care Teams.  These Care Teams include your primary Cardiologist (physician) and Advanced Practice Providers (APPs -  Physician Assistants and Nurse Practitioners) who all work together to provide you with the care you need, when you need it.  We recommend signing up for the patient portal called "MyChart".  Sign up information is provided on this After Visit Summary.  MyChart is used to connect with patients for Virtual Visits (Telemedicine).  Patients are able to view lab/test results, encounter notes, upcoming appointments, etc.  Non-urgent messages can be sent to your provider as well.   To learn more about what you can do with MyChart, go to [email or web address].    Your next appointment:   6 month(s)  The format for your next appointment:   In Person  Provider:    You may see Kathlyn Sacramento, MD or Murray Hodgkins, NP  * Please go to your pharmacy store and get knee high compression stockings (over the counter)

## 2020-01-15 ENCOUNTER — Ambulatory Visit (INDEPENDENT_AMBULATORY_CARE_PROVIDER_SITE_OTHER): Payer: Medicare PPO

## 2020-01-15 ENCOUNTER — Other Ambulatory Visit: Payer: Self-pay

## 2020-01-15 DIAGNOSIS — I4821 Permanent atrial fibrillation: Secondary | ICD-10-CM | POA: Diagnosis not present

## 2020-01-15 DIAGNOSIS — Z7901 Long term (current) use of anticoagulants: Secondary | ICD-10-CM | POA: Diagnosis not present

## 2020-01-15 DIAGNOSIS — Z5181 Encounter for therapeutic drug level monitoring: Secondary | ICD-10-CM | POA: Diagnosis not present

## 2020-01-15 DIAGNOSIS — I4891 Unspecified atrial fibrillation: Secondary | ICD-10-CM | POA: Diagnosis not present

## 2020-01-15 LAB — POCT INR: INR: 2.8 (ref 2.0–3.0)

## 2020-01-15 NOTE — Patient Instructions (Signed)
Please continue current dosage of warfarin 2.5 mg every day EXCEPT 1.25 mg on [facility]. Recheck in 6 weeks.

## 2020-02-13 ENCOUNTER — Other Ambulatory Visit: Payer: Self-pay | Admitting: Family Medicine

## 2020-02-13 NOTE — Telephone Encounter (Signed)
Requested  medications are  due for refill today yes  Requested medications are on active medication list yes  Last refill 11/20/19  Last visit 03/2019  Future visit scheduled 03/2020  Notes to clinic Has upcoming appt, tried to renew this Rx until appt aand it has a stop on it stating must be overridden by MD due to being placed on hold during hospitalization.

## 2020-02-14 ENCOUNTER — Other Ambulatory Visit: Payer: Self-pay | Admitting: Family Medicine

## 2020-02-19 ENCOUNTER — Other Ambulatory Visit: Payer: Self-pay | Admitting: Cardiovascular Disease

## 2020-02-26 ENCOUNTER — Other Ambulatory Visit: Payer: Self-pay

## 2020-02-26 ENCOUNTER — Ambulatory Visit (INDEPENDENT_AMBULATORY_CARE_PROVIDER_SITE_OTHER): Payer: Medicare PPO

## 2020-02-26 DIAGNOSIS — I4891 Unspecified atrial fibrillation: Secondary | ICD-10-CM

## 2020-02-26 DIAGNOSIS — I4821 Permanent atrial fibrillation: Secondary | ICD-10-CM

## 2020-02-26 DIAGNOSIS — Z7901 Long term (current) use of anticoagulants: Secondary | ICD-10-CM | POA: Diagnosis not present

## 2020-02-26 DIAGNOSIS — Z5181 Encounter for therapeutic drug level monitoring: Secondary | ICD-10-CM | POA: Diagnosis not present

## 2020-02-26 LAB — POCT INR: INR: 4.3 — AB (ref 2.0–3.0)

## 2020-02-26 NOTE — Patient Instructions (Signed)
-   skip warfarin today & tomorrow,  - then  continue current dosage of warfarin 2.5 mg every day EXCEPT 1.25 mg on [facility]. - Recheck in 3 weeks

## 2020-02-27 ENCOUNTER — Other Ambulatory Visit: Payer: Self-pay | Admitting: Family Medicine

## 2020-02-27 NOTE — Telephone Encounter (Signed)
Requested Prescriptions  Pending Prescriptions Disp Refills  . atorvastatin (LIPITOR) 10 MG tablet [Pharmacy Med Name: ATORVASTATIN 10 MG TABLET] 90 tablet 0    Sig: TAKE 1 TABLET BY MOUTH EVERY DAY     Cardiovascular:  Antilipid - Statins Failed - 02/27/2020  1:16 AM      Failed - LDL in normal range and within 360 days    LDL Cholesterol (Calc)  Date Value Ref Range Status  06/19/2017 89 mg/dL (calc) Final    Comment:    Reference range: <100 . Desirable range <100 mg/dL for primary prevention;   <70 mg/dL for patients with CHD or diabetic patients  with > or = 2 CHD risk factors. Marland Kitchen LDL-C is now calculated using the Martin-Hopkins  calculation, which is a validated novel method providing  better accuracy than the Friedewald equation in the  estimation of LDL-C.  Cresenciano Genre et al. Annamaria Helling. 1610;960(45): 2061-2068  (http://education.[employer].com/faq/FAQ164)    LDL Chol Calc (NIH)  Date Value Ref Range Status  03/07/2019 84 0 - 99 mg/dL Final         Passed - Total Cholesterol in normal range and within 360 days    Cholesterol, Total  Date Value Ref Range Status  03/07/2019 165 100 - 199 mg/dL Final         Passed - HDL in normal range and within 360 days    HDL  Date Value Ref Range Status  03/07/2019 59 >39 mg/dL Final         Passed - Triglycerides in normal range and within 360 days    Triglycerides  Date Value Ref Range Status  03/07/2019 126 0 - 149 mg/dL Final         Passed - Patient is not pregnant      Passed - Valid encounter within last 12 months    Recent Outpatient Visits          11 months ago Annual physical exam   [facility] Jerrol Banana., MD   1 year ago Encounter for annual physical exam   [facility] Jerrol Banana., MD   2 years ago Essential (primary) hypertension   [facility] Jerrol Banana., MD   3 years ago Permanent atrial fibrillation [facility])   [facility] Jerrol Banana., MD   3 years ago Essential hypertension   [facility] Jerrol Banana., MD             Reminder for patient to schedule an appt for an office visit/physical.

## 2020-02-28 ENCOUNTER — Other Ambulatory Visit: Payer: Self-pay | Admitting: Cardiovascular Disease

## 2020-03-05 ENCOUNTER — Other Ambulatory Visit: Payer: Self-pay | Admitting: Family Medicine

## 2020-03-05 NOTE — Telephone Encounter (Signed)
Requested medication (s) are due for refill today: no  Requested medication (s) are on the active medication list: yes   Last refill:  02/13/2020  Future visit scheduled: no  Notes to clinic:  overdue for follow up     Requested Prescriptions  Pending Prescriptions Disp Refills   alendronate (FOSAMAX) 70 MG tablet [Pharmacy Med Name: ALENDRONATE SODIUM 70 MG TAB] 4 tablet 0    Sig: TAKE 1 TABLET (70 MG TOTAL) BY MOUTH EVERY 7 (SEVEN) DAYS. TAKE WITH A FULL GLASS OF WATER ON AN EMPTY STOMACH.      Endocrinology:  Bisphosphonates Failed - 03/05/2020  9:32 AM      Failed - Ca in normal range and within 360 days    Calcium  Date Value Ref Range Status  03/07/2019 9.5 8.7 - 10.3 mg/dL Final          Failed - Vitamin D in normal range and within 360 days    No results found for: VI1537HK3, EX6147WL2, [id number], 25OHVITD3, 25OHVITD2, 25OHVITD3, 25OHVITD2, 25OHVITD1, 25OHVITD2, 25OHVITD3, VD25OH        Failed - Valid encounter within last 12 months    Recent Outpatient Visits           12 months ago Annual physical exam   [facility] Jerrol Banana., MD   2 years ago Encounter for annual physical exam   [facility] Jerrol Banana., MD   2 years ago Essential (primary) hypertension   [facility] Jerrol Banana., MD   3 years ago Permanent atrial fibrillation [facility])   [facility] Jerrol Banana., MD   3 years ago Essential hypertension   [facility] Jerrol Banana., MD

## 2020-03-08 ENCOUNTER — Other Ambulatory Visit: Payer: Self-pay | Admitting: Cardiovascular Disease

## 2020-03-10 NOTE — Progress Notes (Signed)
This encounter was created in error - please disregard.

## 2020-03-10 NOTE — Telephone Encounter (Signed)
Refill request

## 2020-03-11 ENCOUNTER — Other Ambulatory Visit: Payer: Self-pay

## 2020-03-11 ENCOUNTER — Ambulatory Visit (INDEPENDENT_AMBULATORY_CARE_PROVIDER_SITE_OTHER): Payer: Medicare PPO | Admitting: Family Medicine

## 2020-03-11 DIAGNOSIS — G308 Other Alzheimer's disease: Secondary | ICD-10-CM

## 2020-03-11 DIAGNOSIS — F02818 Dementia in other diseases classified elsewhere, unspecified severity, with other behavioral disturbance: Secondary | ICD-10-CM

## 2020-03-11 DIAGNOSIS — F0281 Dementia in other diseases classified elsewhere with behavioral disturbance: Secondary | ICD-10-CM
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
## 2020-03-12 ENCOUNTER — Telehealth: Payer: Self-pay | Admitting: *Deleted

## 2020-03-12 NOTE — Chronic Care Management (AMB) (Signed)
  Chronic Care Management   Note  03/12/2020 Name: Latasha Anderson MRN: [id number] DOB: 05/17/1931  Latasha Anderson is a 86 y.o. year old female who is a primary care patient of Jerrol Banana., MD. I reached out to [place] by phone today in response to a referral sent by Latasha Anderson's PCP, Jerrol Banana., MD.     Latasha Anderson was given information about Chronic Care Management services today including:  1. CCM service includes personalized support from designated clinical staff supervised by her physician, including individualized plan of care and coordination with other care providers 2. 24/7 contact phone numbers for assistance for urgent and routine care needs. 3. Service will only be billed when office clinical staff spend 20 minutes or more in a month to coordinate care. 4. Only one practitioner may furnish and bill the service in a calendar month. 5. The patient may stop CCM services at any time (effective at the end of the month) by phone call to the office staff. 6. The patient will be responsible for cost sharing (co-pay) of up to 20% of the service fee (after annual deductible is met).  Patient agreed to services and verbal consent obtained.   Follow up plan: Telephone appointment with care management team member scheduled for: 03/19/2020  [place] [facility]

## 2020-03-18 ENCOUNTER — Other Ambulatory Visit: Payer: Self-pay

## 2020-03-18 ENCOUNTER — Ambulatory Visit (INDEPENDENT_AMBULATORY_CARE_PROVIDER_SITE_OTHER): Payer: Medicare PPO

## 2020-03-18 DIAGNOSIS — I4891 Unspecified atrial fibrillation: Secondary | ICD-10-CM

## 2020-03-18 DIAGNOSIS — I4821 Permanent atrial fibrillation: Secondary | ICD-10-CM | POA: Diagnosis not present

## 2020-03-18 DIAGNOSIS — Z5181 Encounter for therapeutic drug level monitoring: Secondary | ICD-10-CM

## 2020-03-18 DIAGNOSIS — Z7901 Long term (current) use of anticoagulants: Secondary | ICD-10-CM | POA: Diagnosis not present

## 2020-03-18 LAB — POCT INR: INR: 3.2 — AB (ref 2.0–3.0)

## 2020-03-18 NOTE — Patient Instructions (Addendum)
-   skip warfarin today  - then START NEW DOSAGE of warfarin 1 tablet (2.5 mg) every day EXCEPT 1/2 TABLET ON MONDAYS & FRIDAYS - Recheck in 3 weeks

## 2020-03-19 ENCOUNTER — Ambulatory Visit: Payer: Medicare PPO | Admitting: *Deleted

## 2020-03-19 NOTE — Chronic Care Management (AMB) (Signed)
  Chronic Care Management   Social Work Note  03/19/2020 Name: Maybree Riling MRN: [id number] DOB: 01-05-1931  Jaeley Wiker is a 86 y.o. year old female who sees Jerrol Banana., MD for primary care. The CCM team was consulted for assistance with [employer] .   Phone call to patient's spouse to assess for community resource needs. Per patient's spouse, he is the primary caregiver of patient and understands that patient's conditions is progressive. Per patient's spouse, patient is refusing to attend medical appointments, has difficulty completing her ADL's, hides and loses things and sees things that are not there. Per patient's spouse, he feels that he can handle patient's care at this time but understands that her condition will progress. He has declined assistance with facility care, however he wil consider a in home aid coming in a few hours a day for respite. This Education officer, museum provided support and encouragement to patient's spouse but also discussed the importance of self care and need for respite. This Education officer, museum further emphasized the importance of keeping dangerous items free from patient's reach. Patient's spouse confirmed that patient does not wander. Patient's spouse interested in home care, resources to be mailed to patient along with information for caregiver support. Patient's spouse declined any further follow up, however expressed continued interest in receiving resources to assist in the future. Patient discussed having a daughter who will be coming over this weekend to discuss how she could assist with care as well.  SDOH (Social Determinants of Health) assessments performed: No     Outpatient Encounter Medications as of 03/19/2020  Medication Sig  . Acetaminophen (TYLENOL PO) Take by mouth as needed.  Marland Kitchen alendronate (FOSAMAX) 70 MG tablet TAKE 1 TABLET (70 MG TOTAL) BY MOUTH EVERY 7 (SEVEN) DAYS. TAKE WITH A FULL GLASS OF WATER ON AN EMPTY STOMACH.  Marland Kitchen  amLODipine-benazepril (LOTREL) 5-40 MG capsule TAKE 1 CAPSULE BY MOUTH EVERY DAY  . atorvastatin (LIPITOR) 10 MG tablet TAKE 1 TABLET BY MOUTH EVERY DAY  . Biotin 10000 MCG TABS Take 1 tablet by mouth daily.  . cholecalciferol (VITAMIN D) 1000 UNITS tablet Take 1,000 Units by mouth daily.  . furosemide (LASIX) 20 MG tablet TAKE 1 TABLET BY MOUTH EVERY DAY  . latanoprost (XALATAN) 0.005 % ophthalmic solution Place into both eyes daily.   . timolol (TIMOPTIC) 0.5 % ophthalmic solution Place into both eyes daily.   Marland Kitchen warfarin (COUMADIN) 2.5 MG tablet TAKE 1 TABLET (2.5 MG TOTAL) BY MOUTH AS DIRECTED.   No facility-administered encounter medications on file as of 03/19/2020.    Goals Addressed   None     Follow Up Plan: This social worker will send patient's spouse resources for in home care and caregiver support. Patient's spouse to call  this Education officer, museum with any additonal resource needs   Elliot Gurney, [place] Worker  [facility] [phone]

## 2020-03-27 ENCOUNTER — Other Ambulatory Visit: Payer: Self-pay | Admitting: Family Medicine

## 2020-04-08 ENCOUNTER — Ambulatory Visit (INDEPENDENT_AMBULATORY_CARE_PROVIDER_SITE_OTHER): Payer: Medicare PPO

## 2020-04-08 ENCOUNTER — Other Ambulatory Visit: Payer: Self-pay

## 2020-04-08 DIAGNOSIS — I4821 Permanent atrial fibrillation: Secondary | ICD-10-CM | POA: Diagnosis not present

## 2020-04-08 DIAGNOSIS — I4891 Unspecified atrial fibrillation: Secondary | ICD-10-CM

## 2020-04-08 DIAGNOSIS — Z5181 Encounter for therapeutic drug level monitoring: Secondary | ICD-10-CM

## 2020-04-08 DIAGNOSIS — Z7901 Long term (current) use of anticoagulants: Secondary | ICD-10-CM | POA: Diagnosis not present

## 2020-04-08 LAB — POCT INR: INR: 1.8 — AB (ref 2.0–3.0)

## 2020-04-08 NOTE — Patient Instructions (Signed)
-   take 2 tablets warfarin today, then  - continue dosage of warfarin 1 tablet (2.5 mg) every day EXCEPT 1/2 [place] in 3 weeks

## 2020-04-16 ENCOUNTER — Other Ambulatory Visit: Payer: Self-pay | Admitting: Family Medicine

## 2020-04-23 ENCOUNTER — Ambulatory Visit: Payer: Self-pay | Admitting: *Deleted

## 2020-04-23 NOTE — Chronic Care Management (AMB) (Signed)
CCM status changed to previously enrolled,     [place], [place] Worker  [place] Management [phone]

## 2020-04-29 ENCOUNTER — Other Ambulatory Visit: Payer: Self-pay

## 2020-04-29 ENCOUNTER — Ambulatory Visit (INDEPENDENT_AMBULATORY_CARE_PROVIDER_SITE_OTHER): Payer: Medicare PPO

## 2020-04-29 DIAGNOSIS — I4891 Unspecified atrial fibrillation: Secondary | ICD-10-CM | POA: Diagnosis not present

## 2020-04-29 DIAGNOSIS — I4821 Permanent atrial fibrillation: Secondary | ICD-10-CM

## 2020-04-29 DIAGNOSIS — Z5181 Encounter for therapeutic drug level monitoring: Secondary | ICD-10-CM | POA: Diagnosis not present

## 2020-04-29 DIAGNOSIS — Z7901 Long term (current) use of anticoagulants: Secondary | ICD-10-CM

## 2020-04-29 LAB — POCT INR: INR: 1.9 — AB (ref 2.0–3.0)

## 2020-04-29 NOTE — Patient Instructions (Signed)
-   take 2 tablets warfarin today, then  - START NEW DOSAGE of warfarin 1 tablet (2.5 mg) every day EXCEPT 1/2 TABLET ON MONDAYS. - Recheck in 3 weeks

## 2020-05-06 ENCOUNTER — Other Ambulatory Visit: Payer: Self-pay | Admitting: Family Medicine

## 2020-05-06 NOTE — Telephone Encounter (Signed)
Requested medication (s) are due for refill today: yes  Requested medication (s) are on the active medication list: yes  Last refill:  03/05/20#4 0 refills   Future visit scheduled: no  Notes to clinic:  last labs 04/03/2019 ; no labs noted for Vit D. Do you want to continue refill?     Requested Prescriptions  Pending Prescriptions Disp Refills   alendronate (FOSAMAX) 70 MG tablet [Pharmacy Med Name: ALENDRONATE SODIUM 70 MG TAB] 4 tablet 0    Sig: TAKE 1 TABLET (70 MG TOTAL) BY MOUTH EVERY 7 (SEVEN) DAYS. TAKE WITH A FULL GLASS OF WATER ON AN EMPTY STOMACH.      Endocrinology:  Bisphosphonates Failed - 05/06/2020 10:32 AM      Failed - Ca in normal range and within 360 days    Calcium  Date Value Ref Range Status  03/07/2019 9.5 8.7 - 10.3 mg/dL Final          Failed - Vitamin D in normal range and within 360 days    No results found for: [id number], UP1031RX4, [id number], 25OHVITD3, 25OHVITD2, 25OHVITD3, 25OHVITD2, 25OHVITD1, 25OHVITD2, 25OHVITD3, VD25OH        Passed - Valid encounter within last 12 months    Recent Outpatient Visits           1 month ago Alzheimer's disease of other onset with behavioral disturbance [facility])   [facility] Jerrol Banana., MD   1 year ago Annual physical exam   [facility] Jerrol Banana., MD   2 years ago Encounter for annual physical exam   [facility] Jerrol Banana., MD   2 years ago Essential (primary) hypertension   [facility] Jerrol Banana., MD   3 years ago Permanent atrial fibrillation [facility])   [facility] Jerrol Banana., MD

## 2020-05-20 ENCOUNTER — Other Ambulatory Visit: Payer: Self-pay

## 2020-05-20 ENCOUNTER — Ambulatory Visit (INDEPENDENT_AMBULATORY_CARE_PROVIDER_SITE_OTHER): Payer: Medicare PPO

## 2020-05-20 DIAGNOSIS — I4891 Unspecified atrial fibrillation: Secondary | ICD-10-CM | POA: Diagnosis not present

## 2020-05-20 DIAGNOSIS — Z5181 Encounter for therapeutic drug level monitoring: Secondary | ICD-10-CM

## 2020-05-20 DIAGNOSIS — Z7901 Long term (current) use of anticoagulants: Secondary | ICD-10-CM

## 2020-05-20 LAB — POCT INR: INR: 2.3 (ref 2.0–3.0)

## 2020-05-20 NOTE — Patient Instructions (Signed)
-   continue dosage of warfarin 1 tablet (2.5 mg) every day EXCEPT 1/2 TABLET ON MONDAYS. - Recheck in 4 weeks

## 2020-05-21 ENCOUNTER — Ambulatory Visit (INDEPENDENT_AMBULATORY_CARE_PROVIDER_SITE_OTHER): Payer: Medicare PPO

## 2020-05-21 DIAGNOSIS — Z23 Encounter for immunization: Secondary | ICD-10-CM | POA: Diagnosis not present

## 2020-05-27 ENCOUNTER — Ambulatory Visit: Payer: Medicare PPO | Admitting: Nurse Practitioner

## 2020-06-17 ENCOUNTER — Ambulatory Visit (INDEPENDENT_AMBULATORY_CARE_PROVIDER_SITE_OTHER): Payer: Medicare PPO

## 2020-06-17 ENCOUNTER — Other Ambulatory Visit: Payer: Self-pay

## 2020-06-17 DIAGNOSIS — Z5181 Encounter for therapeutic drug level monitoring: Secondary | ICD-10-CM

## 2020-06-17 DIAGNOSIS — I4821 Permanent atrial fibrillation: Secondary | ICD-10-CM

## 2020-06-17 DIAGNOSIS — Z7901 Long term (current) use of anticoagulants: Secondary | ICD-10-CM

## 2020-06-17 DIAGNOSIS — I4891 Unspecified atrial fibrillation: Secondary | ICD-10-CM

## 2020-06-17 LAB — POCT INR: INR: 2.3 (ref 2.0–3.0)

## 2020-06-17 NOTE — Patient Instructions (Signed)
-   continue dosage of warfarin 1 tablet (2.5 mg) every day EXCEPT 1/2 TABLET ON MONDAYS. - Recheck in 5 weeks

## 2020-06-21 ENCOUNTER — Other Ambulatory Visit: Payer: Self-pay | Admitting: Cardiovascular Disease

## 2020-07-29 ENCOUNTER — Ambulatory Visit (INDEPENDENT_AMBULATORY_CARE_PROVIDER_SITE_OTHER): Payer: Medicare PPO

## 2020-07-29 ENCOUNTER — Other Ambulatory Visit: Payer: Self-pay

## 2020-07-29 DIAGNOSIS — I4891 Unspecified atrial fibrillation: Secondary | ICD-10-CM | POA: Diagnosis not present

## 2020-07-29 DIAGNOSIS — I4821 Permanent atrial fibrillation: Secondary | ICD-10-CM

## 2020-07-29 DIAGNOSIS — Z5181 Encounter for therapeutic drug level monitoring: Secondary | ICD-10-CM

## 2020-07-29 DIAGNOSIS — Z7901 Long term (current) use of anticoagulants: Secondary | ICD-10-CM

## 2020-07-29 LAB — POCT INR: INR: 2.1 (ref 2.0–3.0)

## 2020-07-29 NOTE — Patient Instructions (Signed)
-   continue dosage of warfarin 1 tablet (2.5 mg) every day EXCEPT 1/2 TABLET ON MONDAYS. - Recheck in 6 weeks

## 2020-08-20 ENCOUNTER — Other Ambulatory Visit: Payer: Self-pay | Admitting: Cardiovascular Disease

## 2020-08-27 ENCOUNTER — Other Ambulatory Visit: Payer: Self-pay

## 2020-08-27 ENCOUNTER — Ambulatory Visit: Payer: Medicare PPO | Admitting: Cardiovascular Disease

## 2020-08-27 ENCOUNTER — Encounter: Payer: Self-pay | Admitting: Cardiovascular Disease

## 2020-08-27 VITALS — BP 138/80 | HR 71 | Ht 66.0 in | Wt 122.1 lb

## 2020-08-27 DIAGNOSIS — I10 Essential (primary) hypertension: Secondary | ICD-10-CM

## 2020-08-27 DIAGNOSIS — I482 Chronic atrial fibrillation, unspecified: Secondary | ICD-10-CM

## 2020-08-27 DIAGNOSIS — I5032 Chronic diastolic (congestive) heart failure: Secondary | ICD-10-CM | POA: Diagnosis not present

## 2020-08-27 NOTE — Patient Instructions (Addendum)
Medication Instructions:  Your physician recommends that you continue on your current medications as directed. Please refer to the Current Medication list given to you today.  *If you need a refill on your cardiac medications before your next appointment, please call your pharmacy*   Lab Work: None ordered If you have labs (blood work) drawn today and your tests are completely normal, you will receive your results only by: Marland Kitchen MyChart Message (if you have MyChart) OR . A paper copy in the mail If you have any lab test that is abnormal or we need to change your treatment, we will call you to review the results.   Testing/Procedures: None ordered   Follow-Up: At [facility], you and your health needs are our priority.  As part of our continuing mission to provide you with exceptional heart care, we have created designated Provider Care Teams.  These Care Teams include your primary Cardiologist (physician) and Advanced Practice Providers (APPs -  Physician Assistants and Nurse Practitioners) who all work together to provide you with the care you need, when you need it.  We recommend signing up for the patient portal called "MyChart".  Sign up information is provided on this After Visit Summary.  MyChart is used to connect with patients for Virtual Visits (Telemedicine).  Patients are able to view lab/test results, encounter notes, upcoming appointments, etc.  Non-urgent messages can be sent to your provider as well.   To learn more about what you can do with MyChart, go to [email or web address].    Your next appointment:   12 month(s)  The format for your next appointment:   In Person  Provider:   You may see Kathlyn Sacramento, MD or one of the following Advanced Practice Providers on your designated Care Team:    Murray Hodgkins, NP  Christell Faith, PA-C  Marrianne Mood, PA-C  Cadence Newport, [place]  Laurann Montana, NP    Other Instructions N/A

## 2020-08-27 NOTE — Progress Notes (Signed)
Cardiology Office Note   Date:  08/27/2020   ID:  [name], [name] Apr 10, 1931, MRN [id number]  PCP:  Jerrol Banana., MD  Cardiologist:   Kathlyn Sacramento, MD   Chief Complaint  Patient presents with  . 6 month follow up     "doing well." Medications reviewed by the patient verbally.       History of Present Illness: Latasha Anderson is a 87 y.o. female who presents for a follow-up visit regarding chronic atrial fibrillation and chronic diastolic heart failure.  She has known history of chronic atrial fibrillation on long-term anticoagulation with warfarin, hypertension and hyperlipidemia. She has no history of ischemic heart disease.  Most recent echocardiogram in October 2019 showed normal LV systolic function with mildly dilated left atrium and mild pulmonary hypertension.  She has been doing well with no recent chest pain, shortness of breath or palpitations.   She was diagnosed with dementia and her memory continues to deteriorate.  She continues to have hallucinations.   Past Medical History:  Diagnosis Date  . (HFpEF) heart failure with preserved ejection fraction ([place])    a. EF 55-65%, January, 2012, question diastolic dysfunction; b. 88/8916 Echo: EF 55-60%, no rwma. Mild MR. Mildly dil LA. Nl RV fxn. PASP 57mmHg.  . Breast cancer ([place]) 1985   RT MASTECTOMY  . Chest pain   . Dyslipidemia   . Hypertension   . Hypokalemia    February, 2014, potassium started  . Mitral regurgitation    Mild, echo, 2012  . Permanent atrial fibrillation ([place])    a. Dx 08/2012. CHA2DSD2VASc = 4-5-->warfarin.  . Shingles   . Shortness of breath    January, 9450, normal systolic function, question diastolic dysfunction    Past Surgical History:  Procedure Laterality Date  . ABDOMINAL HYSTERECTOMY     age 68  . AUGMENTATION MAMMAPLASTY Right 1985   RT MASTECTOMY FOR BREAST CA  . EYE SURGERY     cataract  . MASTECTOMY Right 1985   reconstruction inplant as well      Current Outpatient Medications  Medication Sig Dispense Refill  . Acetaminophen (TYLENOL PO) Take by mouth as needed.    Marland Kitchen alendronate (FOSAMAX) 70 MG tablet TAKE 1 TABLET (70 MG TOTAL) BY MOUTH EVERY 7 (SEVEN) DAYS. TAKE WITH A FULL GLASS OF WATER ON AN EMPTY STOMACH. 4 tablet 5  . amLODipine-benazepril (LOTREL) 5-40 MG capsule TAKE 1 CAPSULE BY MOUTH EVERY DAY 90 capsule 3  . atorvastatin (LIPITOR) 10 MG tablet TAKE 1 TABLET BY MOUTH EVERY DAY 90 tablet 1  . Biotin 10000 MCG TABS Take 1 tablet by mouth daily.    . cholecalciferol (VITAMIN D) 1000 UNITS tablet Take 1,000 Units by mouth daily.    . furosemide (LASIX) 20 MG tablet TAKE 1 TABLET BY MOUTH EVERY DAY 90 tablet 1  . latanoprost (XALATAN) 0.005 % ophthalmic solution Place into both eyes daily.     . timolol (TIMOPTIC) 0.5 % ophthalmic solution Place into both eyes daily.     Marland Kitchen warfarin (COUMADIN) 2.5 MG tablet TAKE 1 TABLET (2.5 MG TOTAL) BY MOUTH AS DIRECTED. 100 tablet 0   No current facility-administered medications for this visit.    Allergies:   Ciprofloxacin, Cortisone, Nitrofurantoin monohyd macro, and Other    Social History:  The patient  reports that she has never smoked. She has never used smokeless tobacco. She reports that she does not drink alcohol and does not use drugs.  Family History:  The patient's family history includes Cancer in her brother; Diabetes in her father; Stroke in her mother.      PHYSICAL EXAM: VS:  BP 138/80 (BP Location: Left Arm, Patient Position: Sitting, Cuff Size: Normal)   Pulse 71   Ht 5\' 6"  (1.676 m)   Wt 122 lb 2 oz (55.4 kg)   SpO2 96%   BMI 19.71 kg/m  , BMI Body mass index is 19.71 kg/m. GEN: Well nourished, well developed, in no acute distress  HEENT: normal  Neck: no JVD, carotid bruits, or masses Cardiac: Irregularly irregular; no murmurs, rubs, or gallops,no edema  Respiratory:  clear to auscultation bilaterally, normal work of breathing GI: soft, nontender,  nondistended, + BS MS: no deformity or atrophy  Skin: warm and dry, no rash Neuro:  Strength and sensation are intact Psych: euthymic mood, full affect   EKG:  EKG is ordered today. The ekg ordered today demonstrates : Atrial fibrillation with nonspecific ST or T wave changes.  Ventricular rate of 71 bpm.   Recent Labs: No results found for requested labs within last 8760 hours.    Lipid Panel    Component Value Date/Time   CHOL 165 03/07/2019 1058   TRIG 126 03/07/2019 1058   HDL 59 03/07/2019 1058   CHOLHDL 2.8 03/07/2019 1058   CHOLHDL 3.3 06/19/2017 0821   LDLCALC 84 03/07/2019 1058   LDLCALC 89 06/19/2017 0821      Wt Readings from Last 3 Encounters:  08/27/20 122 lb 2 oz (55.4 kg)  12/05/19 136 lb 4 oz (61.8 kg)  05/10/19 136 lb (61.7 kg)         ASSESSMENT AND PLAN:  1.  Chronic atrial fibrillation: Ventricular rate is well controlled without medications. She is tolerating anticoagulation with warfarin with therapeutic INR.  The patient will require routine labs this year.  2.  Chronic diastolic heart failure: She appears to be euvolemic on small dose furosemide.    3.  Essential hypertension: Blood pressure is well controlled on current medications.  4.  Dementia: Managed by Dr. Rosanna Randy.    Disposition:   FU with me  6 months  Signed, Kathlyn Sacramento, MD  08/27/2020 1:46 PM    [facility]

## 2020-08-31 ENCOUNTER — Other Ambulatory Visit: Payer: Self-pay | Admitting: Family Medicine

## 2020-08-31 DIAGNOSIS — Z1231 Encounter for screening mammogram for malignant neoplasm of breast: Secondary | ICD-10-CM

## 2020-09-08 ENCOUNTER — Telehealth: Payer: Self-pay

## 2020-09-08 ENCOUNTER — Ambulatory Visit: Payer: Self-pay | Admitting: Family Medicine

## 2020-09-08 NOTE — Telephone Encounter (Signed)
Copied from [place] [phone]. Topic: General - Other >> Sep 08, 2020  1:04 PM Leward Quan A wrote: Reason for CRM: Patient husband called in to say that due to her dementia she ran away from home so he was not able to bring her in for her visit. Please be advised

## 2020-09-08 NOTE — Progress Notes (Deleted)
      Established patient visit   Patient: Latasha Anderson   DOB: 1931/01/02   87 y.o. Female  MRN: [id number] Visit Date: 09/08/2020  Today's healthcare provider: Wilhemena Durie, MD   No chief complaint on file.  Subjective    HPI  Hypertension, follow-up  BP Readings from Last 3 Encounters:  08/27/20 138/80  12/05/19 122/64  05/10/19 110/70   Wt Readings from Last 3 Encounters:  08/27/20 122 lb 2 oz (55.4 kg)  12/05/19 136 lb 4 oz (61.8 kg)  05/10/19 136 lb (61.7 kg)     She was last seen for hypertension 6 months ago.  Management since that visit includes; on amlodipine. She reports {excellent/good/fair/poor:19665} compliance with treatment. She {is/is not:9024} having side effects. {document side effects if present:1} She {is/is not:9024} exercising. She {is/is not:9024} adherent to low salt diet.   Outside blood pressures are {enter patient reported home BP, or 'not being checked':1}.  She {does/does not:200015} smoke.  Use of agents associated with hypertension: {bp agents assoc with hypertension:511::"none"}.   ---------------------------------------------------------------------------------------------------  Alzheimer's disease of other onset with behavioral disturbance ([place]) From 03/11/2020-Hopefully she will keep future appointments here.  Will obtain CCM consult for help with Alzheimer's is disease with behavioral disturbance.  She may actually have Lewy body dementia with hallucinations. Consider neurology referral but I think she might need placement in the future due to her inability or unwillingness to cooperate with her husband.  He is actually 46 years old and taking care of her.   {Show patient history (optional):23778::" "}   Medications: Outpatient Medications Prior to Visit  Medication Sig  . Acetaminophen (TYLENOL PO) Take by mouth as needed.  Marland Kitchen alendronate (FOSAMAX) 70 MG tablet TAKE 1 TABLET (70 MG TOTAL) BY MOUTH EVERY 7 (SEVEN) DAYS.  TAKE WITH A FULL GLASS OF WATER ON AN EMPTY STOMACH.  Marland Kitchen amLODipine-benazepril (LOTREL) 5-40 MG capsule TAKE 1 CAPSULE BY MOUTH EVERY DAY  . atorvastatin (LIPITOR) 10 MG tablet TAKE 1 TABLET BY MOUTH EVERY DAY  . Biotin 10000 MCG TABS Take 1 tablet by mouth daily.  . cholecalciferol (VITAMIN D) 1000 UNITS tablet Take 1,000 Units by mouth daily.  . furosemide (LASIX) 20 MG tablet TAKE 1 TABLET BY MOUTH EVERY DAY  . latanoprost (XALATAN) 0.005 % ophthalmic solution Place into both eyes daily.   . timolol (TIMOPTIC) 0.5 % ophthalmic solution Place into both eyes daily.   Marland Kitchen warfarin (COUMADIN) 2.5 MG tablet TAKE 1 TABLET (2.5 MG TOTAL) BY MOUTH AS DIRECTED.   No facility-administered medications prior to visit.    Review of Systems  Constitutional: Negative for appetite change, chills, fatigue and fever.  Respiratory: Negative for chest tightness and shortness of breath.   Cardiovascular: Negative for chest pain and palpitations.  Gastrointestinal: Negative for abdominal pain, nausea and vomiting.  Neurological: Negative for dizziness and weakness.    {Labs  Heme  Chem  Endocrine  Serology  Results Review (optional):23779::" "}   Objective    There were no vitals taken for this visit. {Show previous vital signs (optional):23777::" "}   Physical Exam  ***  No results found for any visits on 09/08/20.  Assessment & Plan     ***  No follow-ups on file.      {provider attestation***:1}   Wilhemena Durie, MD  [facility] [phone] (phone) [phone] (fax)  [place]

## 2020-09-09 ENCOUNTER — Other Ambulatory Visit: Payer: Self-pay

## 2020-09-09 ENCOUNTER — Ambulatory Visit (INDEPENDENT_AMBULATORY_CARE_PROVIDER_SITE_OTHER): Payer: Medicare PPO

## 2020-09-09 DIAGNOSIS — Z5181 Encounter for therapeutic drug level monitoring: Secondary | ICD-10-CM

## 2020-09-09 DIAGNOSIS — I4821 Permanent atrial fibrillation: Secondary | ICD-10-CM | POA: Diagnosis not present

## 2020-09-09 DIAGNOSIS — I4891 Unspecified atrial fibrillation: Secondary | ICD-10-CM | POA: Diagnosis not present

## 2020-09-09 DIAGNOSIS — Z7901 Long term (current) use of anticoagulants: Secondary | ICD-10-CM

## 2020-09-09 LAB — POCT INR: INR: 1.9 — AB (ref 2.0–3.0)

## 2020-09-09 NOTE — Patient Instructions (Signed)
-   START NEW DOSAGE of warfarin 1 tablet every day - Recheck in 6 weeks

## 2020-09-14 ENCOUNTER — Other Ambulatory Visit: Payer: Self-pay | Admitting: Cardiovascular Disease

## 2020-09-14 NOTE — Telephone Encounter (Signed)
Please review for refill, Thanks !  

## 2020-09-17 ENCOUNTER — Ambulatory Visit
Admission: RE | Admit: 2020-09-17 | Discharge: 2020-09-17 | Disposition: A | Discharge status: Home / Self Care | Payer: Medicare PPO | Source: Ambulatory Visit | Attending: Family Medicine | Admitting: Family Medicine

## 2020-09-17 ENCOUNTER — Other Ambulatory Visit: Payer: Self-pay

## 2020-09-17 DIAGNOSIS — Z1231 Encounter for screening mammogram for malignant neoplasm of breast: Secondary | ICD-10-CM | POA: Diagnosis present

## 2020-10-12 ENCOUNTER — Other Ambulatory Visit: Payer: Self-pay | Admitting: Family Medicine

## 2020-10-12 NOTE — Telephone Encounter (Signed)
Requested medication (s) are due for refill today: yes  Requested medication (s) are on the active medication list: yes  Last refill:  09/09/2020  Future visit scheduled: no  Notes to clinic:  needs labs  Last appt was a no show    Requested Prescriptions  Pending Prescriptions Disp Refills   atorvastatin (LIPITOR) 10 MG tablet [Pharmacy Med Name: ATORVASTATIN 10 MG TABLET] 90 tablet 1    Sig: TAKE 1 TABLET BY MOUTH EVERY DAY      Cardiovascular:  Antilipid - Statins Failed - 10/12/2020  1:29 AM      Failed - Total Cholesterol in normal range and within 360 days    Cholesterol, Total  Date Value Ref Range Status  03/07/2019 165 100 - 199 mg/dL Final          Failed - LDL in normal range and within 360 days    LDL Cholesterol (Calc)  Date Value Ref Range Status  06/19/2017 89 mg/dL (calc) Final    Comment:    Reference range: <100 . Desirable range <100 mg/dL for primary prevention;   <70 mg/dL for patients with CHD or diabetic patients  with > or = 2 CHD risk factors. Marland Kitchen LDL-C is now calculated using the Martin-Hopkins  calculation, which is a validated novel method providing  better accuracy than the Friedewald equation in the  estimation of LDL-C.  Cresenciano Genre et al. Annamaria Helling. 4970;263(78): 2061-2068  (http://education.[employer].com/faq/FAQ164)    LDL Chol Calc (NIH)  Date Value Ref Range Status  03/07/2019 84 0 - 99 mg/dL Final          Failed - HDL in normal range and within 360 days    HDL  Date Value Ref Range Status  03/07/2019 59 >39 mg/dL Final          Failed - Triglycerides in normal range and within 360 days    Triglycerides  Date Value Ref Range Status  03/07/2019 126 0 - 149 mg/dL Final          Passed - Patient is not pregnant      Passed - Valid encounter within last 12 months    Recent Outpatient Visits           7 months ago Alzheimer's disease of other onset with behavioral disturbance [facility])   [facility] Jerrol Banana., MD   1 year ago Annual physical exam   [facility] Jerrol Banana., MD   2 years ago Encounter for annual physical exam   [facility] Jerrol Banana., MD   3 years ago Essential (primary) hypertension   [facility] Jerrol Banana., MD   3 years ago Permanent atrial fibrillation [facility])   [facility] Jerrol Banana., MD

## 2020-10-21 ENCOUNTER — Ambulatory Visit (INDEPENDENT_AMBULATORY_CARE_PROVIDER_SITE_OTHER): Payer: Medicare PPO

## 2020-10-21 ENCOUNTER — Other Ambulatory Visit: Payer: Self-pay

## 2020-10-21 DIAGNOSIS — Z5181 Encounter for therapeutic drug level monitoring: Secondary | ICD-10-CM

## 2020-10-21 DIAGNOSIS — Z7901 Long term (current) use of anticoagulants: Secondary | ICD-10-CM

## 2020-10-21 DIAGNOSIS — I4821 Permanent atrial fibrillation: Secondary | ICD-10-CM | POA: Diagnosis not present

## 2020-10-21 DIAGNOSIS — I4891 Unspecified atrial fibrillation: Secondary | ICD-10-CM

## 2020-10-21 LAB — POCT INR: INR: 2.1 (ref 2.0–3.0)

## 2020-10-21 NOTE — Patient Instructions (Signed)
-   continue dosage of warfarin 1 tablet every day - Recheck in 6 weeks

## 2020-10-22 ENCOUNTER — Other Ambulatory Visit: Payer: Self-pay | Admitting: Family Medicine

## 2020-10-22 NOTE — Telephone Encounter (Signed)
Requested Prescriptions  Pending Prescriptions Disp Refills  . alendronate (FOSAMAX) 70 MG tablet [Pharmacy Med Name: ALENDRONATE SODIUM 70 MG TAB] 12 tablet 1    Sig: TAKE 1 TABLET (70 MG TOTAL) BY MOUTH EVERY 7 (SEVEN) DAYS. TAKE WITH A FULL GLASS OF WATER ON AN EMPTY STOMACH.     Endocrinology:  Bisphosphonates Failed - 10/22/2020  1:31 AM      Failed - Ca in normal range and within 360 days    Calcium  Date Value Ref Range Status  03/07/2019 9.5 8.7 - 10.3 mg/dL Final         Failed - Vitamin D in normal range and within 360 days    No results found for: [id number], [id number], [id number], 25OHVITD3, 25OHVITD2, 25OHVITD3, 25OHVITD2, 25OHVITD1, 25OHVITD2, 25OHVITD3, VD25OH       Passed - Valid encounter within last 12 months    Recent Outpatient Visits          7 months ago Alzheimer's disease of other onset with behavioral disturbance [facility])   [facility] Jerrol Banana., MD   1 year ago Annual physical exam   [facility] Jerrol Banana., MD   2 years ago Encounter for annual physical exam   [facility] Jerrol Banana., MD   3 years ago Essential (primary) hypertension   [facility] Jerrol Banana., MD   3 years ago Permanent atrial fibrillation [facility])   [facility] Jerrol Banana., MD

## 2020-11-26 ENCOUNTER — Other Ambulatory Visit: Payer: Self-pay

## 2020-11-26 ENCOUNTER — Encounter: Payer: Self-pay | Admitting: *Deleted

## 2020-11-26 DIAGNOSIS — R2689 Other abnormalities of gait and mobility: Secondary | ICD-10-CM | POA: Diagnosis present

## 2020-11-26 DIAGNOSIS — I4821 Permanent atrial fibrillation: Secondary | ICD-10-CM | POA: Diagnosis present

## 2020-11-26 DIAGNOSIS — I110 Hypertensive heart disease with heart failure: Secondary | ICD-10-CM | POA: Diagnosis present

## 2020-11-26 DIAGNOSIS — Z853 Personal history of malignant neoplasm of breast: Secondary | ICD-10-CM

## 2020-11-26 DIAGNOSIS — Z66 Do not resuscitate: Secondary | ICD-10-CM | POA: Diagnosis present

## 2020-11-26 DIAGNOSIS — F0391 Unspecified dementia with behavioral disturbance: Secondary | ICD-10-CM | POA: Diagnosis present

## 2020-11-26 DIAGNOSIS — Z20822 Contact with and (suspected) exposure to covid-19: Secondary | ICD-10-CM | POA: Diagnosis present

## 2020-11-26 DIAGNOSIS — I5032 Chronic diastolic (congestive) heart failure: Secondary | ICD-10-CM | POA: Diagnosis present

## 2020-11-26 DIAGNOSIS — I959 Hypotension, unspecified: Secondary | ICD-10-CM | POA: Diagnosis not present

## 2020-11-26 DIAGNOSIS — E876 Hypokalemia: Secondary | ICD-10-CM | POA: Diagnosis present

## 2020-11-26 DIAGNOSIS — R791 Abnormal coagulation profile: Secondary | ICD-10-CM | POA: Diagnosis present

## 2020-11-26 DIAGNOSIS — R339 Retention of urine, unspecified: Secondary | ICD-10-CM | POA: Diagnosis present

## 2020-11-26 DIAGNOSIS — M4856XA Collapsed vertebra, not elsewhere classified, lumbar region, initial encounter for fracture: Secondary | ICD-10-CM | POA: Diagnosis present

## 2020-11-26 DIAGNOSIS — N939 Abnormal uterine and vaginal bleeding, unspecified: Secondary | ICD-10-CM | POA: Diagnosis present

## 2020-11-26 DIAGNOSIS — R296 Repeated falls: Secondary | ICD-10-CM | POA: Diagnosis present

## 2020-11-26 DIAGNOSIS — Z7901 Long term (current) use of anticoagulants: Secondary | ICD-10-CM

## 2020-11-26 DIAGNOSIS — R531 Weakness: Principal | ICD-10-CM | POA: Diagnosis present

## 2020-11-26 DIAGNOSIS — W19XXXA Unspecified fall, initial encounter: Secondary | ICD-10-CM | POA: Diagnosis present

## 2020-11-26 DIAGNOSIS — H9190 Unspecified hearing loss, unspecified ear: Secondary | ICD-10-CM | POA: Diagnosis present

## 2020-11-26 DIAGNOSIS — Z9011 Acquired absence of right breast and nipple: Secondary | ICD-10-CM

## 2020-11-26 LAB — CBC
HCT: 42.1 % (ref 36.0–46.0)
Hemoglobin: 14.5 g/dL (ref 12.0–15.0)
MCH: 32.2 pg (ref 26.0–34.0)
MCHC: 34.4 g/dL (ref 30.0–36.0)
MCV: 93.6 fL (ref 80.0–100.0)
Platelets: 289 10*3/uL (ref 150–400)
RBC: 4.5 MIL/uL (ref 3.87–5.11)
RDW: 12.4 % (ref 11.5–15.5)
WBC: 14 10*3/uL — ABNORMAL HIGH (ref 4.0–10.5)
nRBC: 0 % (ref 0.0–0.2)

## 2020-11-26 LAB — COMPREHENSIVE METABOLIC PANEL
ALT: 20 U/L (ref 0–44)
AST: 23 U/L (ref 15–41)
Albumin: 3.8 g/dL (ref 3.5–5.0)
Alkaline Phosphatase: 52 U/L (ref 38–126)
Anion gap: 11 (ref 5–15)
BUN: 12 mg/dL (ref 8–23)
CO2: 32 mmol/L (ref 22–32)
Calcium: 9.6 mg/dL (ref 8.9–10.3)
Chloride: 94 mmol/L — ABNORMAL LOW (ref 98–111)
Creatinine, Ser: 0.64 mg/dL (ref 0.44–1.00)
GFR, Estimated: >60 mL/min (ref >60)
Glucose, Bld: 155 mg/dL — ABNORMAL HIGH (ref 70–99)
Potassium: 3.5 mmol/L (ref 3.5–5.1)
Sodium: 137 mmol/L (ref 135–145)
Total Bilirubin: 1.1 mg/dL (ref 0.3–1.2)
Total Protein: 6.3 g/dL — ABNORMAL LOW (ref 6.5–8.1)

## 2020-11-26 NOTE — ED Triage Notes (Signed)
Pt to triage with back pain for 2 weeks.  Hx dementia.  No n/v/  Recent falls  Pt alert.

## 2020-11-27 ENCOUNTER — Inpatient Hospital Stay
Admission: EM | Admit: 2020-11-27 | Discharge: 2020-12-02 | DRG: 948 | Disposition: A | Discharge status: Skilled Nursing Facility | Payer: Medicare PPO | Attending: Internal Medicine | Admitting: Internal Medicine

## 2020-11-27 ENCOUNTER — Emergency Department: Payer: Medicare PPO

## 2020-11-27 DIAGNOSIS — I482 Chronic atrial fibrillation, unspecified: Secondary | ICD-10-CM | POA: Diagnosis not present

## 2020-11-27 DIAGNOSIS — R531 Weakness: Secondary | ICD-10-CM

## 2020-11-27 DIAGNOSIS — I5030 Unspecified diastolic (congestive) heart failure: Secondary | ICD-10-CM | POA: Diagnosis present

## 2020-11-27 DIAGNOSIS — I4891 Unspecified atrial fibrillation: Secondary | ICD-10-CM | POA: Diagnosis present

## 2020-11-27 DIAGNOSIS — M545 Low back pain, unspecified: Secondary | ICD-10-CM | POA: Diagnosis present

## 2020-11-27 DIAGNOSIS — W19XXXA Unspecified fall, initial encounter: Secondary | ICD-10-CM

## 2020-11-27 DIAGNOSIS — I5032 Chronic diastolic (congestive) heart failure: Secondary | ICD-10-CM

## 2020-11-27 DIAGNOSIS — I10 Essential (primary) hypertension: Secondary | ICD-10-CM | POA: Diagnosis present

## 2020-11-27 DIAGNOSIS — R296 Repeated falls: Secondary | ICD-10-CM

## 2020-11-27 DIAGNOSIS — R2681 Unsteadiness on feet: Secondary | ICD-10-CM

## 2020-11-27 LAB — RESP PANEL BY RT-PCR (FLU A&B, COVID) ARPGX2
Influenza A by PCR: NEGATIVE
Influenza B by PCR: NEGATIVE
SARS Coronavirus 2 by RT PCR: NEGATIVE

## 2020-11-27 LAB — URINALYSIS, COMPLETE (UACMP) WITH MICROSCOPIC
Bilirubin Urine: NEGATIVE
Glucose, UA: NEGATIVE mg/dL
Hgb urine dipstick: NEGATIVE
Ketones, ur: NEGATIVE mg/dL
Leukocytes,Ua: NEGATIVE
Nitrite: NEGATIVE
Protein, ur: NEGATIVE mg/dL
Specific Gravity, Urine: 1.01 (ref 1.005–1.030)
Squamous Epithelial / HPF: NONE SEEN (ref 0–5)
pH: 7 (ref 5.0–8.0)

## 2020-11-27 LAB — CBC WITH DIFFERENTIAL/PLATELET
Abs Immature Granulocytes: 0.07 10*3/uL (ref 0.00–0.07)
Basophils Absolute: 0 10*3/uL (ref 0.0–0.1)
Basophils Relative: 0 %
Eosinophils Absolute: 0.2 10*3/uL (ref 0.0–0.5)
Eosinophils Relative: 1 %
HCT: 42.6 % (ref 36.0–46.0)
Hemoglobin: 14.5 g/dL (ref 12.0–15.0)
Immature Granulocytes: 1 %
Lymphocytes Relative: 15 %
Lymphs Abs: 2 10*3/uL (ref 0.7–4.0)
MCH: 32.3 pg (ref 26.0–34.0)
MCHC: 34 g/dL (ref 30.0–36.0)
MCV: 94.9 fL (ref 80.0–100.0)
Monocytes Absolute: 1 10*3/uL (ref 0.1–1.0)
Monocytes Relative: 7 %
Neutro Abs: 10.6 10*3/uL — ABNORMAL HIGH (ref 1.7–7.7)
Neutrophils Relative %: 76 %
Platelets: 308 10*3/uL (ref 150–400)
RBC: 4.49 MIL/uL (ref 3.87–5.11)
RDW: 12.5 % (ref 11.5–15.5)
WBC: 13.8 10*3/uL — ABNORMAL HIGH (ref 4.0–10.5)
nRBC: 0 % (ref 0.0–0.2)

## 2020-11-27 LAB — PROTIME-INR
INR: 3.7 — ABNORMAL HIGH (ref 0.8–1.2)
Prothrombin Time: 36.8 seconds — ABNORMAL HIGH (ref 11.4–15.2)

## 2020-11-27 LAB — TROPONIN I (HIGH SENSITIVITY): Troponin I (High Sensitivity): 4 ng/L (ref <18)

## 2020-11-27 MED ORDER — TRAMADOL HCL 50 MG PO TABS
50.0000 mg | ORAL_TABLET | Freq: Three times a day (TID) | ORAL | Status: DC | PRN
Start: 2020-11-27 — End: 2020-12-02
  Administered 2020-11-27 – 2020-11-30 (×2): 50 mg via ORAL
  Filled 2020-11-27 (×2): qty 1

## 2020-11-27 MED ORDER — SODIUM CHLORIDE 0.9 % IV BOLUS
500.0000 mL | Freq: Once | INTRAVENOUS | Status: AC
  Administered 2020-11-27: 500 mL via INTRAVENOUS

## 2020-11-27 MED ORDER — METHOCARBAMOL 1000 MG/10ML IJ SOLN
500.0000 mg | Freq: Four times a day (QID) | INTRAVENOUS | Status: DC | PRN
  Filled 2020-11-27: qty 5

## 2020-11-27 MED ORDER — AMLODIPINE BESYLATE 5 MG PO TABS
5.0000 mg | ORAL_TABLET | Freq: Every day | ORAL | Status: DC
  Administered 2020-11-27 – 2020-11-29 (×2): 5 mg via ORAL
  Filled 2020-11-27 (×2): qty 1

## 2020-11-27 MED ORDER — ONDANSETRON HCL 4 MG PO TABS: 4.0000 mg | ORAL_TABLET | Freq: Four times a day (QID) | ORAL | Status: DC | PRN

## 2020-11-27 MED ORDER — SODIUM CHLORIDE 0.9% FLUSH
3.0000 mL | Freq: Two times a day (BID) | INTRAVENOUS | Status: DC
  Administered 2020-11-27 – 2020-12-02 (×8): 3 mL via INTRAVENOUS

## 2020-11-27 MED ORDER — AMLODIPINE BESY-BENAZEPRIL HCL 5-40 MG PO CAPS: 1.0000 | ORAL_CAPSULE | Freq: Every day | ORAL | Status: DC

## 2020-11-27 MED ORDER — VITAMIN D 25 MCG (1000 UNIT) PO TABS
1000.0000 [IU] | ORAL_TABLET | Freq: Every day | ORAL | Status: DC
  Administered 2020-11-27 – 2020-12-02 (×4): 1000 [IU] via ORAL
  Filled 2020-11-27 (×6): qty 1

## 2020-11-27 MED ORDER — ATORVASTATIN CALCIUM 10 MG PO TABS
10.0000 mg | ORAL_TABLET | Freq: Every day | ORAL | Status: DC
  Administered 2020-11-27 – 2020-12-02 (×4): 10 mg via ORAL
  Filled 2020-11-27 (×6): qty 1

## 2020-11-27 MED ORDER — FUROSEMIDE 20 MG PO TABS
20.0000 mg | ORAL_TABLET | Freq: Every day | ORAL | Status: DC
  Administered 2020-11-27 – 2020-12-02 (×4): 20 mg via ORAL
  Filled 2020-11-27 (×6): qty 1

## 2020-11-27 MED ORDER — TIMOLOL MALEATE 0.5 % OP SOLN
1.0000 [drp] | Freq: Every day | OPHTHALMIC | Status: DC
  Administered 2020-11-29 – 2020-12-02 (×3): 1 [drp] via OPHTHALMIC
  Filled 2020-11-27: qty 5

## 2020-11-27 MED ORDER — LATANOPROST 0.005 % OP SOLN
1.0000 [drp] | Freq: Every day | OPHTHALMIC | Status: DC
  Administered 2020-11-29 – 2020-12-02 (×3): 1 [drp] via OPHTHALMIC
  Filled 2020-11-27 (×2): qty 2.5

## 2020-11-27 MED ORDER — SODIUM CHLORIDE 0.9% FLUSH: 3.0000 mL | INTRAVENOUS | Status: DC | PRN

## 2020-11-27 MED ORDER — ONDANSETRON HCL 4 MG/2ML IJ SOLN: 4.0000 mg | Freq: Four times a day (QID) | INTRAMUSCULAR | Status: DC | PRN

## 2020-11-27 MED ORDER — BENAZEPRIL HCL 20 MG PO TABS
40.0000 mg | ORAL_TABLET | Freq: Every day | ORAL | Status: DC
  Administered 2020-11-27 – 2020-11-29 (×2): 40 mg via ORAL
  Filled 2020-11-27 (×4): qty 2

## 2020-11-27 MED ORDER — SODIUM CHLORIDE 0.9 % IV SOLN
250.0000 mL | INTRAVENOUS | Status: DC | PRN
Start: 2020-11-27 — End: 2020-12-02

## 2020-11-27 NOTE — ED Notes (Signed)
Informed RN bed assigned 

## 2020-11-27 NOTE — ED Notes (Signed)
Received report  Pt resting at present  Denies any complaints

## 2020-11-27 NOTE — H&P (Addendum)
History and Physical    Latasha Anderson ZOX:[id number] DOB: 22-Jul-1930 DOA: 11/27/2020  PCP: Jerrol Banana., MD   Patient coming from: Home  I have personally briefly reviewed patient's old medical records in [place]  Chief Complaint: Back pain/frequent falls  History of the history is obtained from EMR.  Patient is unable to provide any history due to her underlying dementia  HPI: Latasha Anderson is a 87 y.o. female with medical history significant for dementia, atrial fibrillation on chronic anticoagulation with Coumadin, hypertension and chronic diastolic dysfunction CHF who presents to the ER for evaluation of back pain for 2 weeks. History was obtained from patient's husband who also has dementia and is hard of hearing and stated that he had been carrying her around because she had been unable to ambulate due to pain in her back from a fall. I am unable to do review of systems due to this patient due to her mental status. Labs show sodium 137, potassium 3.5, chloride 94, bicarb 32, glucose 155, BUN 12, creatinine 0.64, calcium 9.6, alkaline phosphatase 52, albumin 3.8, AST 23, ALT 20, total protein 6.3, total bilirubin 1.1, white count 13.8, hemoglobin 14.5, hematocrit 42.6, MCV 94.9, RDW 12.5, platelet count 308, PT 36.8, INR 3.7 Lumbar spine x-ray shows markedly limited evaluation due to overlying soft tissues, osseous structures, external object on lateral view. Age-indeterminate T12 and L1 compression fractures. Chest x-ray reviewed by me shows no acute cardiopulmonary disease. Twelve-lead EKG reviewed by me shows atrial fibrillation.    ED Course: Patient is an 87 year old female with a history of dementia, history of A. fib on chronic anticoagulation who presents to the ER for evaluation of back pain following a fall. Lumbar spine x-ray shows age-indeterminate fracture involving T12 and L1. Patient will be admitted to the hospital for further  evaluation.  Review of Systems: As per HPI otherwise all other systems reviewed and negative.    Past Medical History:  Diagnosis Date  . (HFpEF) heart failure with preserved ejection fraction ([place])    a. EF 55-65%, January, 2012, question diastolic dysfunction; b. 81/1914 Echo: EF 55-60%, no rwma. Mild MR. Mildly dil LA. Nl RV fxn. PASP 9mmHg.  . Breast cancer ([place]) 1985   RT MASTECTOMY  . Chest pain   . Dyslipidemia   . Hypertension   . Hypokalemia    February, 2014, potassium started  . Mitral regurgitation    Mild, echo, 2012  . Permanent atrial fibrillation ([place])    a. Dx 08/2012. CHA2DSD2VASc = 4-5-->warfarin.  . Shingles   . Shortness of breath    January, 7829, normal systolic function, question diastolic dysfunction    Past Surgical History:  Procedure Laterality Date  . ABDOMINAL HYSTERECTOMY     age 33  . AUGMENTATION MAMMAPLASTY Right 1985   RT MASTECTOMY FOR BREAST CA  . EYE SURGERY     cataract  . MASTECTOMY Right 1985   reconstruction inplant as well     reports that she has never smoked. She has never used smokeless tobacco. She reports that she does not drink alcohol and does not use drugs.  Allergies  Allergen Reactions  . Ciprofloxacin Nausea And Vomiting  . Cortisone     Pt stated this makes her turn blue  . Nitrofurantoin Monohyd Macro     GI upset & chills.  . Other     Mycins    Family History  Problem Relation Age of Onset  . Stroke Mother   .  Diabetes Father   . Cancer Brother        prostate  . Breast cancer Neg Hx       Prior to Admission medications   Medication Sig Start Date End Date Taking? Authorizing Provider  Acetaminophen (TYLENOL PO) Take by mouth as needed.   Yes [provider]  alendronate (FOSAMAX) 70 MG tablet TAKE 1 TABLET (70 MG TOTAL) BY MOUTH EVERY 7 (SEVEN) DAYS. TAKE WITH A FULL GLASS OF WATER ON AN EMPTY STOMACH. 10/22/20  Yes Jerrol Banana., MD  amLODipine-benazepril (LOTREL) 5-40 MG  capsule TAKE 1 CAPSULE BY MOUTH EVERY DAY 02/19/20  Yes Wellington Hampshire, MD  atorvastatin (LIPITOR) 10 MG tablet TAKE 1 TABLET BY MOUTH EVERY DAY 10/12/20  Yes Jerrol Banana., MD  Biotin 10000 MCG TABS Take 1 tablet by mouth daily.   Yes [provider]  cholecalciferol (VITAMIN D) 1000 UNITS tablet Take 1,000 Units by mouth daily.   Yes [provider]  furosemide (LASIX) 20 MG tablet TAKE 1 TABLET BY MOUTH EVERY DAY 08/20/20  Yes Wellington Hampshire, MD  latanoprost (XALATAN) 0.005 % ophthalmic solution Place 1 drop into both eyes daily. 08/01/12  Yes [provider]  timolol (TIMOPTIC) 0.5 % ophthalmic solution Place 1 drop into both eyes daily. 08/05/12  Yes [provider]  warfarin (COUMADIN) 2.5 MG tablet TAKE 1 TABLET (2.5 MG TOTAL) BY MOUTH AS DIRECTED. Patient taking differently: Take 2.5 mg by mouth daily. 09/14/20  Yes Wellington Hampshire, MD    Physical Exam: Vitals:   11/26/20 2047 11/27/20 0151 11/27/20 0619 11/27/20 0812  BP:  124/79 138/62 135/84  Pulse:  90 85   Resp:  16 16   Temp:  98.2 F (36.8 C) 98.3 F (36.8 C)   TempSrc:  Oral Oral   SpO2:  99% 100%   Weight: 55.3 kg     Height: 5\' 6"  (1.676 m)        Vitals:   11/26/20 2047 11/27/20 0151 11/27/20 0619 11/27/20 0812  BP:  124/79 138/62 135/84  Pulse:  90 85   Resp:  16 16   Temp:  98.2 F (36.8 C) 98.3 F (36.8 C)   TempSrc:  Oral Oral   SpO2:  99% 100%   Weight: 55.3 kg     Height: 5\' 6"  (1.676 m)         Constitutional: Alert and oriented x 1 .  Only to person not to place or time.  Not in any apparent distress HEENT:      Head: Normocephalic and atraumatic.         Eyes: PERLA, EOMI, Conjunctivae are normal. Sclera is non-icteric.       Mouth/Throat: Mucous membranes are moist.       Neck: Supple with no signs of meningismus. Cardiovascular:  Irregularly irregular. No murmurs, gallops, or rubs. 2+ symmetrical distal pulses are present . No JVD. No LE  edema Respiratory: Respiratory effort normal .Lungs sounds clear bilaterally. No wheezes, crackles, or rhonchi.  Gastrointestinal: Soft, non tender, and non distended with positive bowel sounds.  Genitourinary: No CVA tenderness. Musculoskeletal: Nontender with normal range of motion in all extremities. No cyanosis, or erythema of extremities. Neurologic:  Face is symmetric. Moving all extremities. No gross focal neurologic deficits . Skin: Skin is warm, dry.  No rash or ulcers Psychiatric: Mood and affect are normal   Labs on Admission: I have personally reviewed following labs and imaging  studies  CBC: Recent Labs  Lab 11/26/20 2050  WBC 13.8*  14.0*  NEUTROABS 10.6*  HGB 14.5  14.5  HCT 42.6  42.1  MCV 94.9  93.6  PLT 308  937   Basic Metabolic Panel: Recent Labs  Lab 11/26/20 2050  NA 137  K 3.5  CL 94*  CO2 32  GLUCOSE 155*  BUN 12  CREATININE 0.64  CALCIUM 9.6   GFR: Estimated Creatinine Clearance: 43.3 mL/min (by C-G formula based on SCr of 0.64 mg/dL). Liver Function Tests: Recent Labs  Lab 11/26/20 2050  AST 23  ALT 20  ALKPHOS 52  BILITOT 1.1  PROT 6.3*  ALBUMIN 3.8   No results for input(s): LIPASE, AMYLASE in the last 168 hours. No results for input(s): AMMONIA in the last 168 hours. Coagulation Profile: Recent Labs  Lab 11/27/20 0155  INR 3.7*   Cardiac Enzymes: No results for input(s): CKTOTAL, CKMB, CKMBINDEX, TROPONINI in the last 168 hours. BNP (last 3 results) No results for input(s): PROBNP in the last 8760 hours. HbA1C: No results for input(s): HGBA1C in the last 72 hours. CBG: No results for input(s): GLUCAP in the last 168 hours. Lipid Profile: No results for input(s): CHOL, HDL, LDLCALC, TRIG, CHOLHDL, LDLDIRECT in the last 72 hours. Thyroid Function Tests: No results for input(s): TSH, T4TOTAL, FREET4, T3FREE, THYROIDAB in the last 72 hours. Anemia Panel: No results for input(s): VITAMINB12, FOLATE, FERRITIN, TIBC,  IRON, RETICCTPCT in the last 72 hours. Urine analysis:    Component Value Date/Time   COLORURINE YELLOW (A) 11/26/2020 2051   APPEARANCEUR HAZY (A) 11/26/2020 2051   LABSPEC 1.010 11/26/2020 2051   PHURINE 7.0 11/26/2020 2051   GLUCOSEU NEGATIVE 11/26/2020 2051   HGBUR NEGATIVE 11/26/2020 2051   BILIRUBINUR NEGATIVE 11/26/2020 2051   [place] 11/26/2020 2051   PROTEINUR NEGATIVE 11/26/2020 2051   NITRITE NEGATIVE 11/26/2020 2051   LEUKOCYTESUR NEGATIVE 11/26/2020 2051    Radiological Exams on Admission: DG Chest 1 View  Result Date: 11/27/2020 CLINICAL DATA:  Status post fall. Weakness. Back pain. Dementia. Right mastectomy with reconstruction implant. EXAM: CHEST  1 VIEW COMPARISON:  None. FINDINGS: Slightly prominent cardiac silhouette likely due to AP technique. The heart size and mediastinal contours otherwise unremarkable. Aortic calcification. Biapical pleural/pulmonary scarring. No focal consolidation. No pulmonary edema. No pleural effusion. No pneumothorax. No acute osseous abnormality. Breast implant overlies the right lower hemithorax. IMPRESSION: No active disease. Electronically Signed   By: Iven Finn M.D.   On: 11/27/2020 02:44   DG Lumbar Spine Complete  Result Date: 11/27/2020 CLINICAL DATA:  Fall.  Weakness, back pain EXAM: LUMBAR SPINE - COMPLETE 4+ VIEW COMPARISON:  None. FINDINGS: Markedly limited evaluation due to overlying soft tissues, osseous structures, external object on lateral view. Slight dextrocurvature of the lumbar spine. Multilevel degenerative changes of the spine. Age-indeterminate T12 and L1 compression fractures. Alignment is grossly unremarkable of the vertebral bodies. Posterior elements not visualized on lateral view. IMPRESSION: 1. Markedly limited evaluation due to overlying soft tissues, osseous structures, external object on lateral view. 2. Age-indeterminate T12 and L1 compression fractures. Electronically Signed   By: Iven Finn M.D.   On: 11/27/2020 02:47     Assessment/Plan Principal Problem:   Low back pain Active Problems:   Atrial fibrillation (HCC)   Essential (primary) hypertension   Chronic atrial fibrillation (HCC)   Generalized weakness   Frequent falls   (HFpEF) heart failure with preserved ejection fraction ([place])  Low back pain Status post fall Lumbar spine x-ray shows age-indeterminate compression fracture involving T12 and L1 Pain control Place patient on fall precautions We will request neurosurgery consult    Chronic atrial fibrillation Rate controlled Patient on anticoagulation with Coumadin as primary prophylaxis for an acute stroke Hold Coumadin for now since INR is supra therapeutic at 3.7 Check daily PT/INR    Frequent falls Place patient on fall precautions Patient will need PT evaluation once acute low back pain has improved ??  Need to continue Coumadin in this patient with frequent falls with increased risk for bleeding    Chronic diastolic dysfunction CHF Not acutely exacerbated Continue furosemide and amlodipine/benazepril    DVT prophylaxis: Warfarin Code Status: full code Family Communication:  none Disposition Plan: SNF Consults called: Neurosurgery Status: At the time of admission, it appears that the appropriate admission status for this patient is inpatient. This is judged to be reasonable and necessary in order to provide the required intensity of service to ensure the patient's safety given the presenting symptoms, physical exam findings and initial radiographic and laboratory data in the context of their comorbid conditions. Patient requires inpatient status due to high intensity of service, high risk for further deterioration and high frequency of surveillance required.    Collier Bullock MD [facility]     11/27/2020, 8:35 AM

## 2020-11-27 NOTE — Progress Notes (Signed)
Discussed with patient's husband and daughter over the phone about her current condition and plan of care and he is concerned about some vaginal bleeding. Patient is on Coumadin and has a supratherapeutic INR of 3.7.  I have explained to them that this may be the reason why she has the vaginal bleeding and her Coumadin is currently on hold. If her vaginal bleeding persists when her INR becomes therapeutic then we would consider a GYN consult. They verbalized understanding and agree with the plan. CODE STATUS was also discussed and she is a DNR

## 2020-11-27 NOTE — ED Notes (Signed)
Received report   Pt is restless and confused  Denies any pain

## 2020-11-27 NOTE — Plan of Care (Signed)
Pt does not understand

## 2020-11-27 NOTE — Consult Note (Signed)
Neurosurgery-New Consultation Evaluation 11/27/2020 Latasha Anderson [facility] [id number]  Identifying Statement: Latasha Anderson is a 87 y.o. female from [place] [id number] with back pain  Physician Requesting Consultation: Dr Francine Graven  History of Present Illness: Latasha Anderson is here with back pain. She does have dementia and is not cooperative with history taking. She denies any leg pain or numbness. Her husband states the pain has been going on for 2 weeks. She did have a fall and the pain after has prevented her from ambulating. In the ED, an xray revealed multiple small compression fractures.   He states wife has been treated for poor bone quality in the past  Past Medical History:  Past Medical History:  Diagnosis Date  . (HFpEF) heart failure with preserved ejection fraction ([place])    a. EF 55-65%, January, 2012, question diastolic dysfunction; b. 56/3149 Echo: EF 55-60%, no rwma. Mild MR. Mildly dil LA. Nl RV fxn. PASP 62mmHg.  . Breast cancer ([place]) 1985   RT MASTECTOMY  . Chest pain   . Dyslipidemia   . Hypertension   . Hypokalemia    February, 2014, potassium started  . Mitral regurgitation    Mild, echo, 2012  . Permanent atrial fibrillation ([place])    a. Dx 08/2012. CHA2DSD2VASc = 4-5-->warfarin.  . Shingles   . Shortness of breath    January, 7026, normal systolic function, question diastolic dysfunction    Social History: Social History   Socioeconomic History  . Marital status: Married    Spouse name: Not on file  . Number of children: 1  . Years of education: Not on file  . Highest education level: Bachelor's degree (e.g., BA, AB, BS)  Occupational History  . Not on file  Tobacco Use  . Smoking status: Never Smoker  . Smokeless tobacco: Never Used  Vaping Use  . Vaping Use: Never used  Substance and Sexual Activity  . Alcohol use: No    Alcohol/week: 0.0 standard drinks  . Drug use: No  . Sexual activity: Never  Other Topics Concern  . Not on file   Social History Narrative  . Not on file   Social Determinants of Health   Financial Resource Strain: Not on file  Food Insecurity: Not on file  Transportation Needs: Not on file  Physical Activity: Not on file  Stress: Not on file  Social Connections: Not on file  Intimate Partner Violence: Not on file    Family History: Family History  Problem Relation Age of Onset  . Stroke Mother   . Diabetes Father   . Cancer Brother        prostate  . Breast cancer Neg Hx     Review of Systems:  Review of Systems - General ROS: Negative Psychological ROS: Negative Ophthalmic ROS: Negative ENT ROS: Negative Hematological and Lymphatic ROS: Negative  Endocrine ROS: Negative Respiratory ROS: Negative Cardiovascular ROS: Negative Gastrointestinal ROS: Negative Genito-Urinary ROS: Negative Musculoskeletal ROS: Positive for back pain Neurological ROS: Negative for leg pain Dermatological ROS: Negative  Physical Exam: BP 105/62 (BP Location: Left Arm)   Pulse 80   Temp (!) 97.5 F (36.4 C) (Oral)   Resp 16   Ht 5\' 6"  (1.676 m)   Wt 55.3 kg   SpO2 98%   BMI 19.69 kg/m  Body mass index is 19.69 kg/m. Body surface area is 1.6 meters squared. General appearance: Alert, uncooperative with exam, in no acute distress Head: Normocephalic, atraumatic Eyes: Normal, EOM intact Ext: No edema in  LE bilaterally  Neurologic exam:  Mental status: alertness: alert, affect: slightly agitated Speech: fluent and clear Motor:lifts knee and wiggles toes but will not participate in strength exam.  Sensory: intact to light touch in lower extremities Gait: not tested  Laboratory: Results for orders placed or performed during the hospital encounter of 11/27/20  Resp Panel by RT-PCR (Flu A&B, Covid) Nasopharyngeal Swab   Specimen: Nasopharyngeal Swab; Nasopharyngeal(NP) swabs in vial transport medium  Result Value Ref Range   SARS Coronavirus 2 by RT PCR NEGATIVE NEGATIVE   Influenza A by  PCR NEGATIVE NEGATIVE   Influenza B by PCR NEGATIVE NEGATIVE  Comprehensive metabolic panel  Result Value Ref Range   Sodium 137 135 - 145 mmol/L   Potassium 3.5 3.5 - 5.1 mmol/L   Chloride 94 (L) 98 - 111 mmol/L   CO2 32 22 - 32 mmol/L   Glucose, Bld 155 (H) 70 - 99 mg/dL   BUN 12 8 - 23 mg/dL   Creatinine, Ser 0.64 0.44 - 1.00 mg/dL   Calcium 9.6 8.9 - 10.3 mg/dL   Total Protein 6.3 (L) 6.5 - 8.1 g/dL   Albumin 3.8 3.5 - 5.0 g/dL   AST 23 15 - 41 U/L   ALT 20 0 - 44 U/L   Alkaline Phosphatase 52 38 - 126 U/L   Total Bilirubin 1.1 0.3 - 1.2 mg/dL   GFR, Estimated >60 >60 mL/min   Anion gap 11 5 - 15  CBC  Result Value Ref Range   WBC 14.0 (H) 4.0 - 10.5 K/uL   RBC 4.50 3.87 - 5.11 MIL/uL   Hemoglobin 14.5 12.0 - 15.0 g/dL   HCT 42.1 36.0 - 46.0 %   MCV 93.6 80.0 - 100.0 fL   MCH 32.2 26.0 - 34.0 pg   MCHC 34.4 30.0 - 36.0 g/dL   RDW 12.4 11.5 - 15.5 %   Platelets 289 150 - 400 K/uL   nRBC 0.0 0.0 - 0.2 %  Urinalysis, Complete w Microscopic  Result Value Ref Range   Color, Urine YELLOW (A) YELLOW   APPearance HAZY (A) CLEAR   Specific Gravity, Urine 1.010 1.005 - 1.030   pH 7.0 5.0 - 8.0   Glucose, UA NEGATIVE NEGATIVE mg/dL   Hgb urine dipstick NEGATIVE NEGATIVE   Bilirubin Urine NEGATIVE NEGATIVE   Ketones, ur NEGATIVE NEGATIVE mg/dL   Protein, ur NEGATIVE NEGATIVE mg/dL   Nitrite NEGATIVE NEGATIVE   Leukocytes,Ua NEGATIVE NEGATIVE   WBC, UA 0-5 0 - 5 WBC/hpf   Bacteria, UA MANY (A) NONE SEEN   Squamous Epithelial / LPF NONE SEEN 0 - 5   Mucus PRESENT    Hyaline Casts, UA PRESENT   Protime-INR  Result Value Ref Range   Prothrombin Time 36.8 (H) 11.4 - 15.2 seconds   INR 3.7 (H) 0.8 - 1.2  CBC with Differential/Platelet  Result Value Ref Range   WBC 13.8 (H) 4.0 - 10.5 K/uL   RBC 4.49 3.87 - 5.11 MIL/uL   Hemoglobin 14.5 12.0 - 15.0 g/dL   HCT 42.6 36.0 - 46.0 %   MCV 94.9 80.0 - 100.0 fL   MCH 32.3 26.0 - 34.0 pg   MCHC 34.0 30.0 - 36.0 g/dL   RDW 12.5  11.5 - 15.5 %   Platelets 308 150 - 400 K/uL   nRBC 0.0 0.0 - 0.2 %   Neutrophils Relative % 76 %   Neutro Abs 10.6 (H) 1.7 - 7.7 K/uL   Lymphocytes Relative  15 %   Lymphs Abs 2.0 0.7 - 4.0 K/uL   Monocytes Relative 7 %   Monocytes Absolute 1.0 0.1 - 1.0 K/uL   Eosinophils Relative 1 %   Eosinophils Absolute 0.2 0.0 - 0.5 K/uL   Basophils Relative 0 %   Basophils Absolute 0.0 0.0 - 0.1 K/uL   Immature Granulocytes 1 %   Abs Immature Granulocytes 0.07 0.00 - 0.07 K/uL  Troponin I (High Sensitivity)  Result Value Ref Range   Troponin I (High Sensitivity) 4 <18 ng/L   I personally reviewed labs  Imaging: Xray lumbar spine: 1. Markedly limited evaluation due to overlying soft tissues, osseous structures, external object on lateral view. 2. Age-indeterminate T12 and L1 compression fractures.   Impression/Plan:  Latasha Marsan is here with back pain and some mild compression fractures. No surgery is indicated given mild height loss, lack of obvious neurologic symptoms, and age. Would recommend pain control with medication. Also can order a LSO to be worn whenever out of bed.    1.  Diagnosis: Mild compression fracture  2.  Plan - LSO recommended when out of bed, follow up in [place] [facility] in 3-4 weeks.  Pain control per medical team

## 2020-11-27 NOTE — ED Notes (Signed)
Family updated as to patient's status.

## 2020-11-27 NOTE — ED Notes (Signed)
Patient unable to ambulate.  Patient unable to come to side of the bed without excruciating pain.  Dr. Beather Arbour made aware.

## 2020-11-27 NOTE — ED Notes (Signed)
Pt found lying halfway off the bed, this RN instructed pt to try and pull herself back up in the bed with no success. This and RN and Radonna Ricker, RN were able to assist pt to be hoisted up in the bed. No bed alarm available in dept to attach to pt's bed alarm pad. Pt also removes clothing including socks. Quad NT in front of pt's door for safety.

## 2020-11-27 NOTE — ED Notes (Signed)
Patient is resting comfortably. 

## 2020-11-27 NOTE — ED Provider Notes (Signed)
[facility] Emergency Department Provider Note   ____________________________________________   Event Date/Time   First MD Initiated Contact with Patient 11/27/20 [phone]     (approximate)  I have reviewed the triage vital signs and the nursing notes.   HISTORY  Chief Complaint Back Pain  Level of V caveat: Limited by dementia  HPI Latasha Anderson is a 87 y.o. female who presents to the ED from home with a chief complaint of back pain x2 weeks.  History of dementia.  Recent falls.  Endorses generalized weakness.  Denies fever, chills, cough, chest pain, shortness of breath, abdominal pain, nausea, vomiting or dizziness.  Taking warfarin for atrial fibrillation.  Caregiver is her elderly husband who is also demented and hard of hearing.  He states he has been "carrying her around" because she has not been able to walk.     Past Medical History:  Diagnosis Date  . (HFpEF) heart failure with preserved ejection fraction ([place])    a. EF 55-65%, January, 2012, question diastolic dysfunction; b. 93/8182 Echo: EF 55-60%, no rwma. Mild MR. Mildly dil LA. Nl RV fxn. PASP 5mmHg.  . Breast cancer ([place]) 1985   RT MASTECTOMY  . Chest pain   . Dyslipidemia   . Hypertension   . Hypokalemia    February, 2014, potassium started  . Mitral regurgitation    Mild, echo, 2012  . Permanent atrial fibrillation ([place])    a. Dx 08/2012. CHA2DSD2VASc = 4-5-->warfarin.  . Shingles   . Shortness of breath    January, 9937, normal systolic function, question diastolic dysfunction    Patient Active Problem List   Diagnosis Date Noted  . Chronic atrial fibrillation ([place]) 05/01/2015  . Atrophic vaginitis 12/15/2014  . Breast CA ([place]) 12/15/2014  . Decrease in the ability to hear 12/15/2014  . Essential (primary) hypertension 12/15/2014  . Fatigue 12/15/2014  . Acid reflux 12/15/2014  . Glaucoma 12/15/2014  . Flu vaccine need 12/15/2014  . Blood glucose elevated 12/15/2014   . Arthritis, degenerative 12/15/2014  . OP (osteoporosis) 12/15/2014  . HLD (hyperlipidemia) 12/15/2014  . Pain of upper abdomen 12/15/2014  . Encounter for therapeutic drug monitoring 09/04/2013  . Preop cardiovascular exam 03/07/2013  . Hypokalemia   . Long term (current) use of anticoagulants 08/22/2012  . Atrial fibrillation ([place])   . Dyslipidemia   . Hypertension   . Breast cancer ([place])   . Shortness of breath   . Chest pain   . Ejection fraction   . Mitral regurgitation   . SHINGLES 07/26/2010    Past Surgical History:  Procedure Laterality Date  . ABDOMINAL HYSTERECTOMY     age 28  . AUGMENTATION MAMMAPLASTY Right 1985   RT MASTECTOMY FOR BREAST CA  . EYE SURGERY     cataract  . MASTECTOMY Right 1985   reconstruction inplant as well    Prior to Admission medications   Medication Sig Start Date End Date Taking? Authorizing Provider  Acetaminophen (TYLENOL PO) Take by mouth as needed.    [provider]  alendronate (FOSAMAX) 70 MG tablet TAKE 1 TABLET (70 MG TOTAL) BY MOUTH EVERY 7 (SEVEN) DAYS. TAKE WITH A FULL GLASS OF WATER ON AN EMPTY STOMACH. 10/22/20   Jerrol Banana., MD  amLODipine-benazepril (LOTREL) 5-40 MG capsule TAKE 1 CAPSULE BY MOUTH EVERY DAY 02/19/20   Wellington Hampshire, MD  atorvastatin (LIPITOR) 10 MG tablet TAKE 1 TABLET BY MOUTH EVERY DAY 10/12/20   Rosanna Randy,  Retia Passe., MD  Biotin 10000 MCG TABS Take 1 tablet by mouth daily.    [provider]  cholecalciferol (VITAMIN D) 1000 UNITS tablet Take 1,000 Units by mouth daily.    [provider]  furosemide (LASIX) 20 MG tablet TAKE 1 TABLET BY MOUTH EVERY DAY 08/20/20   Wellington Hampshire, MD  latanoprost (XALATAN) 0.005 % ophthalmic solution Place into both eyes daily.  08/01/12   [provider]  timolol (TIMOPTIC) 0.5 % ophthalmic solution Place into both eyes daily.  08/05/12   [provider]  warfarin (COUMADIN) 2.5 MG tablet TAKE 1 TABLET (2.5 MG  TOTAL) BY MOUTH AS DIRECTED. 09/14/20   Wellington Hampshire, MD    Allergies Ciprofloxacin, Cortisone, Nitrofurantoin monohyd macro, and Other  Family History  Problem Relation Age of Onset  . Stroke Mother   . Diabetes Father   . Cancer Brother        prostate  . Breast cancer Neg Hx     Social History Social History   Tobacco Use  . Smoking status: Never Smoker  . Smokeless tobacco: Never Used  Vaping Use  . Vaping Use: Never used  Substance Use Topics  . Alcohol use: No    Alcohol/week: 0.0 standard drinks  . Drug use: No    Review of Systems  Constitutional: No fever/chills Eyes: No visual changes. ENT: No sore throat. Cardiovascular: Denies chest pain. Respiratory: Denies shortness of breath. Gastrointestinal: No abdominal pain.  No nausea, no vomiting.  No diarrhea.  No constipation. Genitourinary: Negative for dysuria. Musculoskeletal: Positive for back pain. Skin: Negative for rash. Neurological: Negative for headaches, focal weakness or numbness.   ____________________________________________   PHYSICAL EXAM:  VITAL SIGNS: ED Triage Vitals  Enc Vitals Group     BP 11/26/20 2044 119/79     Pulse Rate 11/26/20 2044 86     Resp 11/26/20 2044 17     Temp 11/26/20 2044 98.6 F (37 C)     Temp Source 11/26/20 2044 Oral     SpO2 11/26/20 2044 96 %     Weight 11/26/20 2047 122 lb (55.3 kg)     Height 11/26/20 2047 5\' 6"  (1.676 m)     Head Circumference --      Peak Flow --      Pain Score 11/26/20 2047 0     Pain Loc --      Pain Edu? --      Excl. in [place]? --     Constitutional: Alert and oriented.  Elderly appearing and in no acute distress. Eyes: Conjunctivae are normal. PERRL. EOMI. Head: Atraumatic. Nose: Atraumatic. Mouth/Throat: Mucous membranes are mildly dry.  No dental malocclusion. Neck: No stridor.  No cervical spine tenderness to palpation. Cardiovascular: Normal rate, irregular rhythm. Grossly normal heart sounds.  Good peripheral  circulation. Respiratory: Normal respiratory effort.  No retractions. Lungs CTAB. Gastrointestinal: Soft and nontender to light or deep palpation. No distention. No abdominal bruits. No CVA tenderness. Musculoskeletal: Lumbar spine tenderness to palpation.  No step-offs or deformities noted.  Pelvis is stable.  No lower extremity tenderness nor edema.  No joint effusions. Neurologic:  Normal speech and language. No gross focal neurologic deficits are appreciated.  Skin:  Skin is warm, dry and intact. No rash noted. Psychiatric: Mood and affect are normal. Speech and behavior are normal.  ____________________________________________   LABS (all labs ordered are listed, but only abnormal results are displayed)  Labs Reviewed  COMPREHENSIVE METABOLIC  PANEL - Abnormal; Notable for the following components:      Result Value   Chloride 94 (*)    Glucose, Bld 155 (*)    Total Protein 6.3 (*)    All other components within normal limits  CBC - Abnormal; Notable for the following components:   WBC 14.0 (*)    All other components within normal limits  URINALYSIS, COMPLETE (UACMP) WITH MICROSCOPIC - Abnormal; Notable for the following components:   Color, Urine YELLOW (*)    APPearance HAZY (*)    Bacteria, UA MANY (*)    All other components within normal limits  PROTIME-INR - Abnormal; Notable for the following components:   Prothrombin Time 36.8 (*)    INR 3.7 (*)    All other components within normal limits  RESP PANEL BY RT-PCR (FLU A&B, COVID) ARPGX2  CBC WITH DIFFERENTIAL/PLATELET  TROPONIN I (HIGH SENSITIVITY)   ____________________________________________  EKG  ED ECG REPORT I, Serene Kopf J, the attending physician, personally viewed and interpreted this ECG.   Date: 11/27/2020  EKG Time: 0133  Rate: 86  Rhythm: atrial fibrillation, rate 86  Axis: Normal  Intervals:none  ST&T Change: Nonspecific  ____________________________________________  RADIOLOGY I, Ameila Weldon  J, personally viewed and evaluated these images (plain radiographs) as part of my medical decision making, as well as reviewing the written report by the radiologist.  ED MD interpretation: No acute cardio pulmonary process; no acute osseous injury to lumbar spine  Official radiology report(s): DG Chest 1 View  Result Date: 11/27/2020 CLINICAL DATA:  Status post fall. Weakness. Back pain. Dementia. Right mastectomy with reconstruction implant. EXAM: CHEST  1 VIEW COMPARISON:  None. FINDINGS: Slightly prominent cardiac silhouette likely due to AP technique. The heart size and mediastinal contours otherwise unremarkable. Aortic calcification. Biapical pleural/pulmonary scarring. No focal consolidation. No pulmonary edema. No pleural effusion. No pneumothorax. No acute osseous abnormality. Breast implant overlies the right lower hemithorax. IMPRESSION: No active disease. Electronically Signed   By: Iven Finn M.D.   On: 11/27/2020 02:44   DG Lumbar Spine Complete  Result Date: 11/27/2020 CLINICAL DATA:  Fall.  Weakness, back pain EXAM: LUMBAR SPINE - COMPLETE 4+ VIEW COMPARISON:  None. FINDINGS: Markedly limited evaluation due to overlying soft tissues, osseous structures, external object on lateral view. Slight dextrocurvature of the lumbar spine. Multilevel degenerative changes of the spine. Age-indeterminate T12 and L1 compression fractures. Alignment is grossly unremarkable of the vertebral bodies. Posterior elements not visualized on lateral view. IMPRESSION: 1. Markedly limited evaluation due to overlying soft tissues, osseous structures, external object on lateral view. 2. Age-indeterminate T12 and L1 compression fractures. Electronically Signed   By: Iven Finn M.D.   On: 11/27/2020 02:47    ____________________________________________   PROCEDURES  Procedure(s) performed (including Critical Care):  Procedures   ____________________________________________   INITIAL  IMPRESSION / ASSESSMENT AND PLAN / ED COURSE  As part of my medical decision making, I reviewed the following data within the [place] History obtained from family, Nursing notes reviewed and incorporated, Labs reviewed, EKG interpreted, Old chart reviewed, Radiograph reviewed and Notes from prior ED visits     87 year old female presenting with back pain, recent falls.  Differential diagnosis includes but is not limited to musculoskeletal fracture, contusion, UTI, metabolic derangement, ACS, etc.  Basic lab work with mild leukocytosis.  Will add INR, troponin, UA, respiratory panel.  Obtain plain film x-rays of lower back.  Clinical Course as of 11/27/20 0413  Fri Nov 27, 2020  0359 Patient unable to sit up much less ambulate.  UA pending.  Will discuss with hospitalist services for admission. [JS]    Clinical Course User Index [JS] Paulette Blanch, MD     ____________________________________________   FINAL CLINICAL IMPRESSION(S) / ED DIAGNOSES  Final diagnoses:  Fall, initial encounter  Acute midline low back pain without sciatica  Weakness generalized  Gait instability     ED Discharge Orders    None      *Please note:  Starnisha Batrez was evaluated in Emergency Department on 11/27/2020 for the symptoms described in the history of present illness. She was evaluated in the context of the global COVID-19 pandemic, which necessitated consideration that the patient might be at risk for infection with the SARS-CoV-2 virus that causes COVID-19. Institutional protocols and algorithms that pertain to the evaluation of patients at risk for COVID-19 are in a state of rapid change based on information released by regulatory bodies including the CDC and federal and state organizations. These policies and algorithms were followed during the patient's care in the ED.  Some ED evaluations and interventions may be delayed as a result of limited staffing during and the  pandemic.*   Note:  This document was prepared using Dragon voice recognition software and may include unintentional dictation errors.   Paulette Blanch, MD 11/27/20 [phone]

## 2020-11-28 ENCOUNTER — Encounter: Payer: Self-pay | Admitting: Internal Medicine

## 2020-11-28 DIAGNOSIS — I10 Essential (primary) hypertension: Secondary | ICD-10-CM

## 2020-11-28 DIAGNOSIS — I5032 Chronic diastolic (congestive) heart failure: Secondary | ICD-10-CM | POA: Diagnosis not present

## 2020-11-28 DIAGNOSIS — M545 Low back pain, unspecified: Secondary | ICD-10-CM | POA: Diagnosis not present

## 2020-11-28 DIAGNOSIS — I482 Chronic atrial fibrillation, unspecified: Secondary | ICD-10-CM | POA: Diagnosis not present

## 2020-11-28 LAB — BASIC METABOLIC PANEL
Anion gap: 8 (ref 5–15)
BUN: 20 mg/dL (ref 8–23)
CO2: 32 mmol/L (ref 22–32)
Calcium: 8.6 mg/dL — ABNORMAL LOW (ref 8.9–10.3)
Chloride: 98 mmol/L (ref 98–111)
Creatinine, Ser: 0.61 mg/dL (ref 0.44–1.00)
GFR, Estimated: >60 mL/min (ref >60)
Glucose, Bld: 113 mg/dL — ABNORMAL HIGH (ref 70–99)
Potassium: 3.3 mmol/L — ABNORMAL LOW (ref 3.5–5.1)
Sodium: 138 mmol/L (ref 135–145)

## 2020-11-28 LAB — CBC
HCT: 31.6 % — ABNORMAL LOW (ref 36.0–46.0)
Hemoglobin: 10.9 g/dL — ABNORMAL LOW (ref 12.0–15.0)
MCH: 32.3 pg (ref 26.0–34.0)
MCHC: 34.5 g/dL (ref 30.0–36.0)
MCV: 93.8 fL (ref 80.0–100.0)
Platelets: 235 10*3/uL (ref 150–400)
RBC: 3.37 MIL/uL — ABNORMAL LOW (ref 3.87–5.11)
RDW: 12.7 % (ref 11.5–15.5)
WBC: 11.5 10*3/uL — ABNORMAL HIGH (ref 4.0–10.5)
nRBC: 0 % (ref 0.0–0.2)

## 2020-11-28 LAB — PHOSPHORUS: Phosphorus: 3.6 mg/dL (ref 2.5–4.6)

## 2020-11-28 LAB — PROTIME-INR
INR: 4.6 (ref 0.8–1.2)
Prothrombin Time: 43.6 seconds — ABNORMAL HIGH (ref 11.4–15.2)

## 2020-11-28 LAB — MAGNESIUM: Magnesium: 1.7 mg/dL (ref 1.7–2.4)

## 2020-11-28 MED ORDER — HALOPERIDOL LACTATE 5 MG/ML IJ SOLN
5.0000 mg | Freq: Once | INTRAMUSCULAR | Status: AC
  Administered 2020-11-28: 5 mg via INTRAMUSCULAR
  Filled 2020-11-28: qty 1

## 2020-11-28 MED ORDER — HALOPERIDOL LACTATE 5 MG/ML IJ SOLN
2.0000 mg | Freq: Four times a day (QID) | INTRAMUSCULAR | Status: DC | PRN
  Filled 2020-11-28: qty 1

## 2020-11-28 MED ORDER — POTASSIUM CHLORIDE 10 MEQ/100ML IV SOLN
10.0000 meq | INTRAVENOUS | Status: AC
  Administered 2020-11-28 (×4): 10 meq via INTRAVENOUS
  Filled 2020-11-28 (×2): qty 100

## 2020-11-28 MED ORDER — QUETIAPINE FUMARATE 25 MG PO TABS
25.0000 mg | ORAL_TABLET | Freq: Every day | ORAL | Status: DC
  Administered 2020-11-28 – 2020-12-01 (×4): 25 mg via ORAL
  Filled 2020-11-28 (×4): qty 1

## 2020-11-28 NOTE — Progress Notes (Addendum)
Progress Note    Latasha Anderson  CHE:[id number] DOB: Oct 08, 1930  DOA: 11/27/2020 PCP: Jerrol Banana., MD      Brief Narrative:    Medical records reviewed and are as summarized below:  Latasha Anderson is a 87 y.o. female with medical history significant for dementia, atrial fibrillation on Coumadin, hypertension, chronic diastolic CHF, who presented to the hospital because of 2-week history of back pain.  There is a history of fall. Lumbar spine x-ray showed age-indeterminate compression fracture involving T12 and L1.  Neurosurgery was consulted to assist with management.  He recommended LSO brace and conservative management.     Assessment/Plan:   Principal Problem:   Low back pain Active Problems:   Atrial fibrillation (HCC)   Essential (primary) hypertension   Chronic atrial fibrillation (HCC)   Generalized weakness   Frequent falls   (HFpEF) heart failure with preserved ejection fraction (HCC)   Body mass index is 19.69 kg/m.   Age-indeterminate T12 and L1 compression fracture, low back pain: Analgesics as needed for pain.  Neurosurgery recommended LSO brace.  Chronic atrial fibrillation with supratherapeutic INR: INR is 4.6.  Hold Coumadin.  Monitor INR.  Hypokalemia: Replete potassium intravenously since patient is not taking anything medications by mouth.  Check magnesium and phosphorus levels.  Dementia with behavioral problem, delirium and combativeness: Haldol as needed for agitation.  Start Seroquel nightly.  Continue supportive care.  Generalized weakness, frequent falls at home: Fall precautions.  PT and OT evaluation.  Vaginal bleeding: No vaginal bleeding reported this morning.  Continue to monitor.  Hypertension: Continue antihypertensives as able   Diet Order            Diet 2 gram sodium Room service appropriate? Yes; Fluid consistency: Thin  Diet effective now                    Consultants:  Neurosurgeon, Dr.  Cari Caraway  Procedures:  None    Medications:   . amLODipine  5 mg Oral Daily   And  . benazepril  40 mg Oral Daily  . atorvastatin  10 mg Oral Daily  . cholecalciferol  1,000 Units Oral Daily  . furosemide  20 mg Oral Daily  . latanoprost  1 drop Both Eyes Daily  . QUEtiapine  25 mg Oral QHS  . sodium chloride flush  3 mL Intravenous Q12H  . timolol  1 drop Both Eyes Daily   Continuous Infusions: . sodium chloride    . methocarbamol (ROBAXIN) IV       Anti-infectives (From admission, onward)   None             Family Communication/Anticipated D/C date and plan/Code Status   DVT prophylaxis:      Code Status: DNR  Family Communication: Husband at the bedside Disposition Plan:    Status is: Observation  The patient will require care spanning > 2 midnights and should be moved to inpatient because: Altered mental status and Inpatient level of care appropriate due to severity of illness  Dispo: The patient is from: Home              Anticipated d/c is to: SNF              Patient currently is not medically stable to d/c.   Difficult to place patient No           Subjective:   Interval events noted.  Patient has been  confused, agitated and combative.  Her husband was at the bedside.  Her nurse was also at the bedside.  Objective:    Vitals:   11/27/20 1243 11/27/20 2051 11/28/20 0540 11/28/20 0834  BP: 139/81 105/62 105/61 110/62  Pulse: 76 80 92 75  Resp: 16 16 15    Temp: 98.7 F (37.1 C) (!) 97.5 F (36.4 C) 97.9 F (36.6 C)   TempSrc:  Oral    SpO2: 99% 98% 93%   Weight:      Height:       No data found.   Intake/Output Summary (Last 24 hours) at 11/28/2020 1419 Last data filed at 11/28/2020 0730 Gross per 24 hour  Intake --  Output 750 ml  Net -750 ml   Filed Weights   11/26/20 2047  Weight: 55.3 kg    Exam:  GEN: NAD SKIN: Warm and dry EYES: No pallor or icterus ENT: MMM CV: RRR PULM: CTA B ABD: soft, ND,  NT, +BS CNS: Alert but confused.  Nonfocal EXT: No edema or tenderness PSYCH: Agitated and combative       Data Reviewed:   I have personally reviewed following labs and imaging studies:  Labs: Labs show the following:   Basic Metabolic Panel: Recent Labs  Lab 11/26/20 2050 11/28/20 0533  NA 137 138  K 3.5 3.3*  CL 94* 98  CO2 32 32  GLUCOSE 155* 113*  BUN 12 20  CREATININE 0.64 0.61  CALCIUM 9.6 8.6*   GFR Estimated Creatinine Clearance: 43.3 mL/min (by C-G formula based on SCr of 0.61 mg/dL). Liver Function Tests: Recent Labs  Lab 11/26/20 2050  AST 23  ALT 20  ALKPHOS 52  BILITOT 1.1  PROT 6.3*  ALBUMIN 3.8   No results for input(s): LIPASE, AMYLASE in the last 168 hours. No results for input(s): AMMONIA in the last 168 hours. Coagulation profile Recent Labs  Lab 11/27/20 0155 11/28/20 0533  INR 3.7* 4.6*    CBC: Recent Labs  Lab 11/26/20 2050 11/28/20 0533  WBC 13.8*  14.0* 11.5*  NEUTROABS 10.6*  --   HGB 14.5  14.5 10.9*  HCT 42.6  42.1 31.6*  MCV 94.9  93.6 93.8  PLT 308  289 235   Cardiac Enzymes: No results for input(s): CKTOTAL, CKMB, CKMBINDEX, TROPONINI in the last 168 hours. BNP (last 3 results) No results for input(s): PROBNP in the last 8760 hours. CBG: No results for input(s): GLUCAP in the last 168 hours. D-Dimer: No results for input(s): DDIMER in the last 72 hours. Hgb A1c: No results for input(s): HGBA1C in the last 72 hours. Lipid Profile: No results for input(s): CHOL, HDL, LDLCALC, TRIG, CHOLHDL, LDLDIRECT in the last 72 hours. Thyroid function studies: No results for input(s): TSH, T4TOTAL, T3FREE, THYROIDAB in the last 72 hours.  Invalid input(s): FREET3 Anemia work up: No results for input(s): VITAMINB12, FOLATE, FERRITIN, TIBC, IRON, RETICCTPCT in the last 72 hours. Sepsis Labs: Recent Labs  Lab 11/26/20 2050 11/28/20 0533  WBC 13.8*  14.0* 11.5*    Microbiology Recent Results (from the past  240 hour(s))  Resp Panel by RT-PCR (Flu A&B, Covid) Nasopharyngeal Swab     Status: None   Collection Time: 11/27/20  1:57 AM   Specimen: Nasopharyngeal Swab; Nasopharyngeal(NP) swabs in vial transport medium  Result Value Ref Range Status   SARS Coronavirus 2 by RT PCR NEGATIVE NEGATIVE Final    Comment: (NOTE) SARS-CoV-2 target nucleic acids are NOT DETECTED.  The SARS-CoV-2 RNA  is generally detectable in upper respiratory specimens during the acute phase of infection. The lowest concentration of SARS-CoV-2 viral copies this assay can detect is 138 copies/mL. A negative result does not preclude SARS-Cov-2 infection and should not be used as the sole basis for treatment or other patient management decisions. A negative result may occur with  improper specimen collection/handling, submission of specimen other than nasopharyngeal swab, presence of viral mutation(s) within the areas targeted by this assay, and inadequate number of viral copies(<138 copies/mL). A negative result must be combined with clinical observations, patient history, and epidemiological information. The expected result is Negative.  Fact Sheet for Patients:  EntrepreneurPulse.com.au  Fact Sheet for Healthcare Providers:  IncredibleEmployment.be  This test is no t yet approved or cleared by the [place] FDA and  has been authorized for detection and/or diagnosis of SARS-CoV-2 by FDA under an Emergency Use Authorization (EUA). This EUA will remain  in effect (meaning this test can be used) for the duration of the COVID-19 declaration under Section 564(b)(1) of the Act, 21 U.S.C.section 360bbb-3(b)(1), unless the authorization is terminated  or revoked sooner.       Influenza A by PCR NEGATIVE NEGATIVE Final   Influenza B by PCR NEGATIVE NEGATIVE Final    Comment: (NOTE) The Xpert Xpress SARS-CoV-2/FLU/RSV plus assay is intended as an aid in the diagnosis of influenza  from Nasopharyngeal swab specimens and should not be used as a sole basis for treatment. Nasal washings and aspirates are unacceptable for Xpert Xpress SARS-CoV-2/FLU/RSV testing.  Fact Sheet for Patients: EntrepreneurPulse.com.au  Fact Sheet for Healthcare Providers: IncredibleEmployment.be  This test is not yet approved or cleared by the [place] FDA and has been authorized for detection and/or diagnosis of SARS-CoV-2 by FDA under an Emergency Use Authorization (EUA). This EUA will remain in effect (meaning this test can be used) for the duration of the COVID-19 declaration under Section 564(b)(1) of the Act, 21 U.S.C. section 360bbb-3(b)(1), unless the authorization is terminated or revoked.  Performed at [facility] [address] [place], [place] [address]     Procedures and diagnostic studies:  DG Chest 1 View  Result Date: 11/27/2020 CLINICAL DATA:  Status post fall. Weakness. Back pain. Dementia. Right mastectomy with reconstruction implant. EXAM: CHEST  1 VIEW COMPARISON:  None. FINDINGS: Slightly prominent cardiac silhouette likely due to AP technique. The heart size and mediastinal contours otherwise unremarkable. Aortic calcification. Biapical pleural/pulmonary scarring. No focal consolidation. No pulmonary edema. No pleural effusion. No pneumothorax. No acute osseous abnormality. Breast implant overlies the right lower hemithorax. IMPRESSION: No active disease. Electronically Signed   By: Iven Finn M.D.   On: 11/27/2020 02:44   DG Lumbar Spine Complete  Result Date: 11/27/2020 CLINICAL DATA:  Fall.  Weakness, back pain EXAM: LUMBAR SPINE - COMPLETE 4+ VIEW COMPARISON:  None. FINDINGS: Markedly limited evaluation due to overlying soft tissues, osseous structures, external object on lateral view. Slight dextrocurvature of the lumbar spine. Multilevel degenerative changes of the spine. Age-indeterminate T12 and L1  compression fractures. Alignment is grossly unremarkable of the vertebral bodies. Posterior elements not visualized on lateral view. IMPRESSION: 1. Markedly limited evaluation due to overlying soft tissues, osseous structures, external object on lateral view. 2. Age-indeterminate T12 and L1 compression fractures. Electronically Signed   By: Iven Finn M.D.   On: 11/27/2020 02:47               LOS: 0 days   [facility]  Pager on [email or web address]. If 7PM-7AM, please contact night-coverage at [email or web address]     11/28/2020, 2:19 PM

## 2020-11-28 NOTE — Progress Notes (Signed)
MD notified of critical INR of 4.6

## 2020-11-29 DIAGNOSIS — I5032 Chronic diastolic (congestive) heart failure: Secondary | ICD-10-CM | POA: Diagnosis not present

## 2020-11-29 DIAGNOSIS — R296 Repeated falls: Secondary | ICD-10-CM | POA: Diagnosis not present

## 2020-11-29 DIAGNOSIS — M545 Low back pain, unspecified: Secondary | ICD-10-CM | POA: Diagnosis not present

## 2020-11-29 DIAGNOSIS — R531 Weakness: Secondary | ICD-10-CM | POA: Diagnosis not present

## 2020-11-29 LAB — CBC WITH DIFFERENTIAL/PLATELET
Abs Immature Granulocytes: 0.08 10*3/uL — ABNORMAL HIGH (ref 0.00–0.07)
Basophils Absolute: 0 10*3/uL (ref 0.0–0.1)
Basophils Relative: 0 %
Eosinophils Absolute: 0 10*3/uL (ref 0.0–0.5)
Eosinophils Relative: 0 %
HCT: 26.9 % — ABNORMAL LOW (ref 36.0–46.0)
Hemoglobin: 9.3 g/dL — ABNORMAL LOW (ref 12.0–15.0)
Immature Granulocytes: 1 %
Lymphocytes Relative: 17 %
Lymphs Abs: 2 10*3/uL (ref 0.7–4.0)
MCH: 32.5 pg (ref 26.0–34.0)
MCHC: 34.6 g/dL (ref 30.0–36.0)
MCV: 94.1 fL (ref 80.0–100.0)
Monocytes Absolute: 1.2 10*3/uL — ABNORMAL HIGH (ref 0.1–1.0)
Monocytes Relative: 10 %
Neutro Abs: 8.5 10*3/uL — ABNORMAL HIGH (ref 1.7–7.7)
Neutrophils Relative %: 72 %
Platelets: 237 10*3/uL (ref 150–400)
RBC: 2.86 MIL/uL — ABNORMAL LOW (ref 3.87–5.11)
RDW: 12.6 % (ref 11.5–15.5)
WBC: 11.8 10*3/uL — ABNORMAL HIGH (ref 4.0–10.5)
nRBC: 0 % (ref 0.0–0.2)

## 2020-11-29 LAB — BASIC METABOLIC PANEL
Anion gap: 8 (ref 5–15)
BUN: 20 mg/dL (ref 8–23)
CO2: 30 mmol/L (ref 22–32)
Calcium: 8.5 mg/dL — ABNORMAL LOW (ref 8.9–10.3)
Chloride: 97 mmol/L — ABNORMAL LOW (ref 98–111)
Creatinine, Ser: 0.5 mg/dL (ref 0.44–1.00)
GFR, Estimated: >60 mL/min (ref >60)
Glucose, Bld: 116 mg/dL — ABNORMAL HIGH (ref 70–99)
Potassium: 3.8 mmol/L (ref 3.5–5.1)
Sodium: 135 mmol/L (ref 135–145)

## 2020-11-29 LAB — PROTIME-INR
INR: 3.3 — ABNORMAL HIGH (ref 0.8–1.2)
Prothrombin Time: 33.2 seconds — ABNORMAL HIGH (ref 11.4–15.2)

## 2020-11-29 NOTE — Evaluation (Signed)
Physical Therapy Evaluation Patient Details Name: Latasha Anderson MRN: [id number] DOB: [birth year] Today's Date: 11/29/2020   History of Present Illness  Patient is an 87 year old female with a history of dementia, history of A. fib on chronic anticoagulation who presents to the ER for evaluation of back pain following a fall. Lumbar spine x-ray shows age-indeterminate fracture involving T12 and L1.  Neuro consult - pt to wear LSO when out of bed.  Clinical Impression  Patient received supine in bed resting, with her husband at bedside. Husband able to provide home setting information, pt not oriented to self, location or situation at time of PT eval. She was lethargic during evaluation, but was willing to try sitting up with PT. Pt's husband reports that wife has cognitive deficits at baseline that have been progressing over the past 6-9 months. He also reports pt has had numerous falls this yr, but the most recent was on their brick steps. Patient with slow processing, required increased time and increased verbal/tactile cueing during bed mobility for sequencing and appropriate hand placement. Pt was able to complete supine to sit EOB with maxA from therapist. Pt sat EOB for 3 minutes to allow for exchange of soiled transfer sheet. Pt completed lateral scooting on EOB with max A from therapist, verbal and tactile cues provided to the pt for appropriate sequencing and hand placement. Pt verbalized increase in back pain and was returned to the supine position with HOB elevated to 28 degrees. Pt's pain decreased with supine positioning. Discussed with RN, needing assist to eat breakfast. Pt has an order for a LSO for all OOB activity, LSO not available at time of evaluation. Pt is appropriate for continued skilled PT interventions to address bed mobility, gait and safe transfers.     Follow Up Recommendations SNF    Equipment Recommendations  None recommended by PT    Recommendations for Other  Services       Precautions / Restrictions Precautions Precautions: Other (comment) (At time of eval LSO not available) Precaution Comments: LSO when pt is out of bed Required Braces or Orthoses: Spinal Brace Spinal Brace: Other (comment) Spinal Brace Comments: LSO for OOB activity Restrictions Weight Bearing Restrictions: No      Mobility  Bed Mobility Overal bed mobility: Needs Assistance Bed Mobility: Sidelying to Sit   Sidelying to sit: Max assist       General bed mobility comments: Pt provided maxA with verbal and tactile cues for sequencing and hand placement Patient Response: Anxious  Transfers Overall transfer level: Needs assistance   Transfers: Lateral/Scoot Transfers          Lateral/Scoot Transfers: Max assist General transfer comment: Pt able to scoot laterally with maxA and to allow for transfer sheet change, pt provided VCs and tactile cues for hand placement  Ambulation/Gait Ambulation/Gait assistance:  (Pt with orders for LSO, and LSO for OOB activity)            Balance Overall balance assessment: History of Falls         Pertinent Vitals/Pain Pain Assessment: Faces Faces Pain Scale: Hurts whole lot Pain Location: Back Pain Descriptors / Indicators: Aching;Discomfort;Dull;Grimacing Pain Intervention(s): Limited activity within patient's tolerance;Monitored during session;Repositioned    Home Living Family/patient expects to be discharged to:: Private residence Living Arrangements: Spouse/significant other Available Help at Discharge: Family Type of Home: House Home Access: Stairs to enter Entrance Stairs-Rails: None Entrance Stairs-Number of Steps: 4 Home Layout: One level Home Equipment: Cane - single point Additional  Comments: Pt's husband reports that pt has a cane, but does not use it regularly to ambulate    Prior Function Level of Independence: Needs assistance   Gait / Transfers Assistance Needed: Prior to most recent  fall pt was I with ambulation using a SPC intermittently  ADL's / Homemaking Assistance Needed: Pt's husband completes all the cooking and cleaning 2/2 to cognitive decline  Comments: Pt difficult to arouse and required constant VCs to maintain level of arousal during session     Hand Dominance        Extremity/Trunk Assessment        Lower Extremity Assessment Lower Extremity Assessment: Generalized weakness (Pt able to assist somewhat in moving LE during bed mobility, but requires max A)    Cervical / Trunk Assessment Cervical / Trunk Assessment: Kyphotic  Communication   Communication: Other (comment) (Pt able to follow simple commands if given to pt slowly and sometimes repetively with tactile cues)  Cognition Arousal/Alertness: Lethargic Behavior During Therapy: Flat affect Overall Cognitive Status: History of cognitive impairments - at baseline                                 General Comments: Pt not oriented at time of eval to self, location or situation      General Comments General comments (skin integrity, edema, etc.): Pt's husb reports history of falls with the most recent being on the brick stairs to get inside their home.    Exercises     Assessment/Plan    PT Assessment Patient needs continued PT services  PT Problem List Decreased strength;Decreased mobility;Decreased safety awareness;Decreased activity tolerance;Decreased cognition;Decreased knowledge of precautions;Decreased balance;Decreased knowledge of use of DME;Pain       PT Treatment Interventions DME instruction;Therapeutic activities;Gait training;Therapeutic exercise;Patient/family education;Stair training;Balance training;Functional mobility training;Neuromuscular re-education    PT Goals (Current goals can be found in the Care Plan section)  Acute Rehab PT Goals Patient Stated Goal: NA - pt unable to report goal 2/2 impaired cognition PT Goal Formulation: With  patient/family Time For Goal Achievement: 12/13/20 Potential to Achieve Goals: Fair    Frequency Min 2X/week   Barriers to discharge Other (comment) Caregiver unable to physically assist pt at this time    Co-evaluation               AM-PAC PT "6 Clicks" Mobility  Outcome Measure Help needed turning from your back to your side while in a flat bed without using bedrails?: A Lot Help needed moving from lying on your back to sitting on the side of a flat bed without using bedrails?: A Lot Help needed moving to and from a bed to a chair (including a wheelchair)?: Total Help needed standing up from a chair using your arms (e.g., wheelchair or bedside chair)?: Total Help needed to walk in hospital room?: Total Help needed climbing 3-5 steps with a railing? : Total 6 Click Score: 8    End of Session Equipment Utilized During Treatment: Gait belt Activity Tolerance: Patient limited by lethargy;Patient limited by pain Patient left: in bed;with family/visitor present;with bed alarm set Nurse Communication: Mobility status;Other (comment) (Pt's hsb requesting assist with feeding pt) PT Visit Diagnosis: Muscle weakness (generalized) (M62.81);History of falling (Z91.81)    Time: 9678-9381 PT Time Calculation (min) (ACUTE ONLY): 40 min   Charges:   PT Evaluation $PT Eval Moderate Complexity: 1 Mod PT Treatments $Therapeutic Activity: 23-37 mins  Duanne Guess, PT, DPT 11/29/20, 1:17 PM   Kylee Umana Malcolm [place] 11/29/2020, 1:10 PM

## 2020-11-29 NOTE — Progress Notes (Addendum)
Progress Note    Latasha Anderson  XYI:[id number] DOB: 1930/08/18  DOA: 11/27/2020 PCP: Jerrol Banana., MD      Brief Narrative:    Medical records reviewed and are as summarized below:  Latasha Anderson is a 87 y.o. female with medical history significant for dementia, atrial fibrillation on Coumadin, hypertension, chronic diastolic CHF, who presented to the hospital because of 2-week history of back pain.  There is a history of fall. Lumbar spine x-ray showed age-indeterminate compression fracture involving T12 and L1.  Neurosurgery was consulted to assist with management.  He recommended LSO brace and conservative management.     Assessment/Plan:   Principal Problem:   Low back pain Active Problems:   Atrial fibrillation (HCC)   Essential (primary) hypertension   Chronic atrial fibrillation (HCC)   Generalized weakness   Frequent falls   (HFpEF) heart failure with preserved ejection fraction (HCC)   Body mass index is 19.69 kg/m.   Age-indeterminate T12 and L1 compression fracture, low back pain: Continue analgesics.  Neurosurgery recommended LSO brace.  Chronic atrial fibrillation with supratherapeutic INR: INR is down to 3.3.  Hold Coumadin.  Monitor INR.  Hypokalemia: Improved  Dementia with behavioral problem, delirium and combativeness: Haldol as needed for agitation.  Continue Seroquel.  Continue supportive care.  Generalized weakness, frequent falls at home: Fall precautions.  PT and OT evaluation.  Vaginal bleeding: No reported vaginal bleeding.  Hypertension: Continue antihypertensives as able   Diet Order            Diet 2 gram sodium Room service appropriate? Yes; Fluid consistency: Thin  Diet effective now                    Consultants:  Neurosurgeon, Dr. Cari Caraway  Procedures:  None    Medications:   . amLODipine  5 mg Oral Daily   And  . benazepril  40 mg Oral Daily  . atorvastatin  10 mg Oral Daily  .  cholecalciferol  1,000 Units Oral Daily  . furosemide  20 mg Oral Daily  . latanoprost  1 drop Both Eyes Daily  . QUEtiapine  25 mg Oral QHS  . sodium chloride flush  3 mL Intravenous Q12H  . timolol  1 drop Both Eyes Daily   Continuous Infusions: . sodium chloride    . methocarbamol (ROBAXIN) IV       Anti-infectives (From admission, onward)   None             Family Communication/Anticipated D/C date and plan/Code Status   DVT prophylaxis:      Code Status: DNR  Family Communication: Husband at the bedside Disposition Plan:    Status is: Observation  The patient will require care spanning > 2 midnights and should be moved to inpatient because: Altered mental status and Inpatient level of care appropriate due to severity of illness  Dispo: The patient is from: Home              Anticipated d/c is to: SNF              Patient currently is not medically stable to d/c.   Difficult to place patient No           Subjective:   Interval events noted.  She is still confused.  Objective:    Vitals:   11/28/20 2113 11/29/20 0437 11/29/20 0927 11/29/20 0943  BP: (!) 109/54 (!) 152/71 (!) 78/51 Marland Kitchen)  89/61  Pulse: 84 91 97   Resp: 16 16 18    Temp: 97.7 F (36.5 C) 98.3 F (36.8 C) 97.7 F (36.5 C)   TempSrc:  Oral Oral   SpO2: 100% 97% 94%   Weight:      Height:       No data found.   Intake/Output Summary (Last 24 hours) at 11/29/2020 1332 Last data filed at 11/29/2020 6644 Gross per 24 hour  Intake 120 ml  Output 0 ml  Net 120 ml   Filed Weights   11/26/20 2047  Weight: 55.3 kg    Exam:  GEN: NAD SKIN: Warm and dry EYES: No pallor or icterus ENT: MMM CV: RRR PULM: CTA B ABD: soft, ND, NT, +BS CNS: Alert but disoriented, non focal EXT: No edema or tenderness         Data Reviewed:   I have personally reviewed following labs and imaging studies:  Labs: Labs show the following:   Basic Metabolic Panel: Recent Labs   Lab 11/26/20 2050 11/28/20 0533 11/29/20 0804  NA 137 138 135  K 3.5 3.3* 3.8  CL 94* 98 97*  CO2 32 32 30  GLUCOSE 155* 113* 116*  BUN 12 20 20   CREATININE 0.64 0.61 0.50  CALCIUM 9.6 8.6* 8.5*  MG  --  1.7  --   PHOS  --  3.6  --    GFR Estimated Creatinine Clearance: 43.3 mL/min (by C-G formula based on SCr of 0.5 mg/dL). Liver Function Tests: Recent Labs  Lab 11/26/20 2050  AST 23  ALT 20  ALKPHOS 52  BILITOT 1.1  PROT 6.3*  ALBUMIN 3.8   No results for input(s): LIPASE, AMYLASE in the last 168 hours. No results for input(s): AMMONIA in the last 168 hours. Coagulation profile Recent Labs  Lab 11/27/20 0155 11/28/20 0533 11/29/20 0804  INR 3.7* 4.6* 3.3*    CBC: Recent Labs  Lab 11/26/20 2050 11/28/20 0533 11/29/20 0804  WBC 13.8*  14.0* 11.5* 11.8*  NEUTROABS 10.6*  --  8.5*  HGB 14.5  14.5 10.9* 9.3*  HCT 42.6  42.1 31.6* 26.9*  MCV 94.9  93.6 93.8 94.1  PLT 308  289 235 237   Cardiac Enzymes: No results for input(s): CKTOTAL, CKMB, CKMBINDEX, TROPONINI in the last 168 hours. BNP (last 3 results) No results for input(s): PROBNP in the last 8760 hours. CBG: No results for input(s): GLUCAP in the last 168 hours. D-Dimer: No results for input(s): DDIMER in the last 72 hours. Hgb A1c: No results for input(s): HGBA1C in the last 72 hours. Lipid Profile: No results for input(s): CHOL, HDL, LDLCALC, TRIG, CHOLHDL, LDLDIRECT in the last 72 hours. Thyroid function studies: No results for input(s): TSH, T4TOTAL, T3FREE, THYROIDAB in the last 72 hours.  Invalid input(s): FREET3 Anemia work up: No results for input(s): VITAMINB12, FOLATE, FERRITIN, TIBC, IRON, RETICCTPCT in the last 72 hours. Sepsis Labs: Recent Labs  Lab 11/26/20 2050 11/28/20 0533 11/29/20 0804  WBC 13.8*  14.0* 11.5* 11.8*    Microbiology Recent Results (from the past 240 hour(s))  Resp Panel by RT-PCR (Flu A&B, Covid) Nasopharyngeal Swab     Status: None    Collection Time: 11/27/20  1:57 AM   Specimen: Nasopharyngeal Swab; Nasopharyngeal(NP) swabs in vial transport medium  Result Value Ref Range Status   SARS Coronavirus 2 by RT PCR NEGATIVE NEGATIVE Final    Comment: (NOTE) SARS-CoV-2 target nucleic acids are NOT DETECTED.  The SARS-CoV-2 RNA  is generally detectable in upper respiratory specimens during the acute phase of infection. The lowest concentration of SARS-CoV-2 viral copies this assay can detect is 138 copies/mL. A negative result does not preclude SARS-Cov-2 infection and should not be used as the sole basis for treatment or other patient management decisions. A negative result may occur with  improper specimen collection/handling, submission of specimen other than nasopharyngeal swab, presence of viral mutation(s) within the areas targeted by this assay, and inadequate number of viral copies(<138 copies/mL). A negative result must be combined with clinical observations, patient history, and epidemiological information. The expected result is Negative.  Fact Sheet for Patients:  EntrepreneurPulse.com.au  Fact Sheet for Healthcare Providers:  IncredibleEmployment.be  This test is no t yet approved or cleared by the [place] FDA and  has been authorized for detection and/or diagnosis of SARS-CoV-2 by FDA under an Emergency Use Authorization (EUA). This EUA will remain  in effect (meaning this test can be used) for the duration of the COVID-19 declaration under Section 564(b)(1) of the Act, 21 U.S.C.section 360bbb-3(b)(1), unless the authorization is terminated  or revoked sooner.       Influenza A by PCR NEGATIVE NEGATIVE Final   Influenza B by PCR NEGATIVE NEGATIVE Final    Comment: (NOTE) The Xpert Xpress SARS-CoV-2/FLU/RSV plus assay is intended as an aid in the diagnosis of influenza from Nasopharyngeal swab specimens and should not be used as a sole basis for treatment.  Nasal washings and aspirates are unacceptable for Xpert Xpress SARS-CoV-2/FLU/RSV testing.  Fact Sheet for Patients: EntrepreneurPulse.com.au  Fact Sheet for Healthcare Providers: IncredibleEmployment.be  This test is not yet approved or cleared by the [place] FDA and has been authorized for detection and/or diagnosis of SARS-CoV-2 by FDA under an Emergency Use Authorization (EUA). This EUA will remain in effect (meaning this test can be used) for the duration of the COVID-19 declaration under Section 564(b)(1) of the Act, 21 U.S.C. section 360bbb-3(b)(1), unless the authorization is terminated or revoked.  Performed at [facility], [address] [place], [place] [address]     Procedures and diagnostic studies:  No results found.             LOS: 0 days   Kairi Harshbarger  [facility]   Pager on [email or web address]. If 7PM-7AM, please contact night-coverage at [email or web address]     11/29/2020, 1:32 PM

## 2020-11-30 DIAGNOSIS — R791 Abnormal coagulation profile: Secondary | ICD-10-CM | POA: Diagnosis present

## 2020-11-30 DIAGNOSIS — Z9011 Acquired absence of right breast and nipple: Secondary | ICD-10-CM | POA: Diagnosis not present

## 2020-11-30 DIAGNOSIS — R296 Repeated falls: Secondary | ICD-10-CM | POA: Diagnosis present

## 2020-11-30 DIAGNOSIS — Z7901 Long term (current) use of anticoagulants: Secondary | ICD-10-CM | POA: Diagnosis not present

## 2020-11-30 DIAGNOSIS — I4821 Permanent atrial fibrillation: Secondary | ICD-10-CM | POA: Diagnosis present

## 2020-11-30 DIAGNOSIS — I110 Hypertensive heart disease with heart failure: Secondary | ICD-10-CM | POA: Diagnosis present

## 2020-11-30 DIAGNOSIS — E876 Hypokalemia: Secondary | ICD-10-CM | POA: Diagnosis present

## 2020-11-30 DIAGNOSIS — Z20822 Contact with and (suspected) exposure to covid-19: Secondary | ICD-10-CM | POA: Diagnosis present

## 2020-11-30 DIAGNOSIS — R2689 Other abnormalities of gait and mobility: Secondary | ICD-10-CM | POA: Diagnosis present

## 2020-11-30 DIAGNOSIS — F0391 Unspecified dementia with behavioral disturbance: Secondary | ICD-10-CM | POA: Diagnosis present

## 2020-11-30 DIAGNOSIS — Z66 Do not resuscitate: Secondary | ICD-10-CM | POA: Diagnosis present

## 2020-11-30 DIAGNOSIS — I5032 Chronic diastolic (congestive) heart failure: Secondary | ICD-10-CM | POA: Diagnosis present

## 2020-11-30 DIAGNOSIS — M545 Low back pain, unspecified: Secondary | ICD-10-CM | POA: Diagnosis not present

## 2020-11-30 DIAGNOSIS — R339 Retention of urine, unspecified: Secondary | ICD-10-CM | POA: Diagnosis present

## 2020-11-30 DIAGNOSIS — N939 Abnormal uterine and vaginal bleeding, unspecified: Secondary | ICD-10-CM | POA: Diagnosis present

## 2020-11-30 DIAGNOSIS — W19XXXA Unspecified fall, initial encounter: Secondary | ICD-10-CM | POA: Diagnosis present

## 2020-11-30 DIAGNOSIS — I482 Chronic atrial fibrillation, unspecified: Secondary | ICD-10-CM | POA: Diagnosis not present

## 2020-11-30 DIAGNOSIS — I10 Essential (primary) hypertension: Secondary | ICD-10-CM | POA: Diagnosis not present

## 2020-11-30 DIAGNOSIS — R531 Weakness: Secondary | ICD-10-CM | POA: Diagnosis present

## 2020-11-30 DIAGNOSIS — Z853 Personal history of malignant neoplasm of breast: Secondary | ICD-10-CM | POA: Diagnosis not present

## 2020-11-30 DIAGNOSIS — I959 Hypotension, unspecified: Secondary | ICD-10-CM | POA: Diagnosis not present

## 2020-11-30 DIAGNOSIS — H9190 Unspecified hearing loss, unspecified ear: Secondary | ICD-10-CM | POA: Diagnosis present

## 2020-11-30 DIAGNOSIS — M4856XA Collapsed vertebra, not elsewhere classified, lumbar region, initial encounter for fracture: Secondary | ICD-10-CM | POA: Diagnosis present

## 2020-11-30 LAB — PROTIME-INR
INR: 2.2 — ABNORMAL HIGH (ref 0.8–1.2)
Prothrombin Time: 24.7 seconds — ABNORMAL HIGH (ref 11.4–15.2)

## 2020-11-30 NOTE — TOC Progression Note (Signed)
Transition of Care [facility]) - Progression Note    Patient Details  Name: Latasha Anderson MRN: [id number] Date of Birth: 1931/06/07  [facility]) CM/SW [place], RN Phone Number: 11/30/2020, 1:37 PM  Clinical Narrative:   Met with the patient's daughter and husband in the room to discuss DC plan and needs They are agreeable for her to go to [facility] for STR, explained the bedsearch process and ins approval process, Obtained PASSR, FL2 completed Bedsearch sent          Expected Discharge Plan and Services                                                 Social Determinants of Health (SDOH) Interventions    Readmission Risk Interventions No flowsheet data found.

## 2020-11-30 NOTE — Progress Notes (Addendum)
Progress Note    Latasha Anderson  MLY:[id number] DOB: 1930/09/19  DOA: 11/27/2020 PCP: Jerrol Banana., MD      Brief Narrative:    Medical records reviewed and are as summarized below:  Latasha Anderson is a 87 y.o. female with medical history significant for dementia, atrial fibrillation on Coumadin, hypertension, chronic diastolic CHF, who presented to the hospital because of 2-week history of back pain.  There is a history of fall. Lumbar spine x-ray showed age-indeterminate compression fracture involving T12 and L1.  Neurosurgery was consulted to assist with management.  He recommended LSO brace and conservative management.     Assessment/Plan:   Principal Problem:   Low back pain Active Problems:   Atrial fibrillation (HCC)   Essential (primary) hypertension   Chronic atrial fibrillation (HCC)   Generalized weakness   Frequent falls   (HFpEF) heart failure with preserved ejection fraction (HCC)   Body mass index is 19.69 kg/m.   Age-indeterminate T12 and L1 compression fracture, low back pain: Continue analgesics.  Neurosurgery recommended LSO brace.  Chronic atrial fibrillation with supratherapeutic INR: INR is down to 2.2.  Continue to hold Coumadin for now.   Hypokalemia: Improved  Dementia with behavioral problem, delirium and combativeness: Haldol as needed for agitation.  Continue Seroquel.  Continue supportive care.  Generalized weakness, frequent falls at home: Fall precautions.  Continue PT and OT.  Vaginal bleeding: No reported vaginal bleeding.  Hypotension: Hold amlodipine and benazepril.  Monitor BP closely.  Urinary retention: Foley catheter was placed on 11/29/2020 for acute urinary retention.  She had previously required in and out urethral catheterization.   Diet Order            Diet 2 gram sodium Room service appropriate? Yes; Fluid consistency: Thin  Diet effective now                     Consultants:  Neurosurgeon, Dr. Cari Caraway  Procedures:  None    Medications:   . atorvastatin  10 mg Oral Daily  . cholecalciferol  1,000 Units Oral Daily  . furosemide  20 mg Oral Daily  . latanoprost  1 drop Both Eyes Daily  . QUEtiapine  25 mg Oral QHS  . sodium chloride flush  3 mL Intravenous Q12H  . timolol  1 drop Both Eyes Daily   Continuous Infusions: . sodium chloride    . methocarbamol (ROBAXIN) IV       Anti-infectives (From admission, onward)   None             Family Communication/Anticipated D/C date and plan/Code Status   DVT prophylaxis:      Code Status: DNR  Family Communication: Husband at the bedside Disposition Plan:    Status is: Observation  The patient will require care spanning > 2 midnights and should be moved to inpatient because: Altered mental status and Inpatient level of care appropriate due to severity of illness  Dispo: The patient is from: Home              Anticipated d/c is to: SNF              Patient currently is not medically stable to d/c.   Difficult to place patient No           Subjective:   Interval events noted.  She is still confused.  She is mute and does not answer any questions.  Ebony Hail, OT, was  at the bedside.  Objective:    Vitals:   11/30/20 0005 11/30/20 0431 11/30/20 0542 11/30/20 0717  BP: 108/72 111/67 101/64 (!) 99/59  Pulse: 92 90 79 81  Resp: 16 17 18 16   Temp: 97.8 F (36.6 C) 98.4 F (36.9 C) 97.9 F (36.6 C) 98 F (36.7 C)  TempSrc: Oral  Oral   SpO2: 97% 97% 100% 97%  Weight:      Height:       No data found.   Intake/Output Summary (Last 24 hours) at 11/30/2020 1104 Last data filed at 11/30/2020 0643 Gross per 24 hour  Intake 120 ml  Output 300 ml  Net -180 ml   Filed Weights   11/26/20 2047  Weight: 55.3 kg    Exam:  GEN: NAD SKIN: Warm and dry EYES: No pallor or icterus ENT: MMM CV: RRR PULM: CTA B ABD: soft, ND, NT, +BS CNS:  Alert.  Difficult to assess.  Patient is mute and does not answer any questions. EXT: No edema or tenderness MSK: No lumbosacral spinal tenderness GU: Foley catheter draining amber urine         Data Reviewed:   I have personally reviewed following labs and imaging studies:  Labs: Labs show the following:   Basic Metabolic Panel: Recent Labs  Lab 11/26/20 2050 11/28/20 0533 11/29/20 0804  NA 137 138 135  K 3.5 3.3* 3.8  CL 94* 98 97*  CO2 32 32 30  GLUCOSE 155* 113* 116*  BUN 12 20 20   CREATININE 0.64 0.61 0.50  CALCIUM 9.6 8.6* 8.5*  MG  --  1.7  --   PHOS  --  3.6  --    GFR Estimated Creatinine Clearance: 42.4 mL/min (by C-G formula based on SCr of 0.5 mg/dL). Liver Function Tests: Recent Labs  Lab 11/26/20 2050  AST 23  ALT 20  ALKPHOS 52  BILITOT 1.1  PROT 6.3*  ALBUMIN 3.8   No results for input(s): LIPASE, AMYLASE in the last 168 hours. No results for input(s): AMMONIA in the last 168 hours. Coagulation profile Recent Labs  Lab 11/27/20 0155 11/28/20 0533 11/29/20 0804 11/30/20 0619  INR 3.7* 4.6* 3.3* 2.2*    CBC: Recent Labs  Lab 11/26/20 2050 11/28/20 0533 11/29/20 0804  WBC 13.8*  14.0* 11.5* 11.8*  NEUTROABS 10.6*  --  8.5*  HGB 14.5  14.5 10.9* 9.3*  HCT 42.6  42.1 31.6* 26.9*  MCV 94.9  93.6 93.8 94.1  PLT 308  289 235 237   Cardiac Enzymes: No results for input(s): CKTOTAL, CKMB, CKMBINDEX, TROPONINI in the last 168 hours. BNP (last 3 results) No results for input(s): PROBNP in the last 8760 hours. CBG: No results for input(s): GLUCAP in the last 168 hours. D-Dimer: No results for input(s): DDIMER in the last 72 hours. Hgb A1c: No results for input(s): HGBA1C in the last 72 hours. Lipid Profile: No results for input(s): CHOL, HDL, LDLCALC, TRIG, CHOLHDL, LDLDIRECT in the last 72 hours. Thyroid function studies: No results for input(s): TSH, T4TOTAL, T3FREE, THYROIDAB in the last 72 hours.  Invalid input(s):  FREET3 Anemia work up: No results for input(s): VITAMINB12, FOLATE, FERRITIN, TIBC, IRON, RETICCTPCT in the last 72 hours. Sepsis Labs: Recent Labs  Lab 11/26/20 2050 11/28/20 0533 11/29/20 0804  WBC 13.8*  14.0* 11.5* 11.8*    Microbiology Recent Results (from the past 240 hour(s))  Resp Panel by RT-PCR (Flu A&B, Covid) Nasopharyngeal Swab     Status: None  Collection Time: 11/27/20  1:57 AM   Specimen: Nasopharyngeal Swab; Nasopharyngeal(NP) swabs in vial transport medium  Result Value Ref Range Status   SARS Coronavirus 2 by RT PCR NEGATIVE NEGATIVE Final    Comment: (NOTE) SARS-CoV-2 target nucleic acids are NOT DETECTED.  The SARS-CoV-2 RNA is generally detectable in upper respiratory specimens during the acute phase of infection. The lowest concentration of SARS-CoV-2 viral copies this assay can detect is 138 copies/mL. A negative result does not preclude SARS-Cov-2 infection and should not be used as the sole basis for treatment or other patient management decisions. A negative result may occur with  improper specimen collection/handling, submission of specimen other than nasopharyngeal swab, presence of viral mutation(s) within the areas targeted by this assay, and inadequate number of viral copies(<138 copies/mL). A negative result must be combined with clinical observations, patient history, and epidemiological information. The expected result is Negative.  Fact Sheet for Patients:  EntrepreneurPulse.com.au  Fact Sheet for Healthcare Providers:  IncredibleEmployment.be  This test is no t yet approved or cleared by the [place] FDA and  has been authorized for detection and/or diagnosis of SARS-CoV-2 by FDA under an Emergency Use Authorization (EUA). This EUA will remain  in effect (meaning this test can be used) for the duration of the COVID-19 declaration under Section 564(b)(1) of the Act, 21 U.S.C.section  360bbb-3(b)(1), unless the authorization is terminated  or revoked sooner.       Influenza A by PCR NEGATIVE NEGATIVE Final   Influenza B by PCR NEGATIVE NEGATIVE Final    Comment: (NOTE) The Xpert Xpress SARS-CoV-2/FLU/RSV plus assay is intended as an aid in the diagnosis of influenza from Nasopharyngeal swab specimens and should not be used as a sole basis for treatment. Nasal washings and aspirates are unacceptable for Xpert Xpress SARS-CoV-2/FLU/RSV testing.  Fact Sheet for Patients: EntrepreneurPulse.com.au  Fact Sheet for Healthcare Providers: IncredibleEmployment.be  This test is not yet approved or cleared by the [place] FDA and has been authorized for detection and/or diagnosis of SARS-CoV-2 by FDA under an Emergency Use Authorization (EUA). This EUA will remain in effect (meaning this test can be used) for the duration of the COVID-19 declaration under Section 564(b)(1) of the Act, 21 U.S.C. section 360bbb-3(b)(1), unless the authorization is terminated or revoked.  Performed at [facility], [address] [place], [place] [address]     Procedures and diagnostic studies:  No results found.             LOS: 0 days   Eriana Suliman  [facility]   Pager on [email or web address]. If 7PM-7AM, please contact night-coverage at [email or web address]     11/30/2020, 11:04 AM

## 2020-11-30 NOTE — Evaluation (Signed)
Occupational Therapy Evaluation Patient Details Name: Latasha Anderson MRN: [id number] DOB: 1931-03-11 Today's Date: 11/30/2020    History of Present Illness Patient is an 87 y/o F with a PMH including dementia and A. fib on chronic anticoagulation who presents to the ER for evaluation of back pain following a fall. Lumbar spine x-ray shows age-indeterminate fracture involving T12 and L1.   Clinical Impression   Pt seen for OT evaluation this date in setting of acute hospitalization d/t fall with back pain. Pt with limited verbal communication with OT throughout session, most PLOF information gathered from PT note in which pt's spouse helped inform. Pt presents this date with decreased activity tolerance, pain, general deconditioning and weakness superimposed on baseline cognitive deficits, impacting her ability to safely perform ADLs/ADL mobility. Pt requires MAX A for log-roll technique to come to EOB sitting and demos P sitting balance, Standing deferred as pt w/o LSO available in the room (rec by neuro sx for OOB). Pt requires MIN/MOD A for seated UB g/h tasks including oral care and TOTAL A for LB ADLs including donning socks while in EOB sitting d/t limited ROM 2/2 precautions and pain. Pt returned to bed with MAX A to scoot and TOTAL A for sit to sup. Left with alarm on and all needs met. Will continue to follow acutely. Recommend STR upon d/c for safety with ADLs.     Follow Up Recommendations  SNF;Supervision/Assistance - 24 hour    Equipment Recommendations  3 in 1 bedside commode;Tub/shower seat;Other (comment) (2ww)    Recommendations for Other Services       Precautions / Restrictions Precautions Precautions: Back Restrictions Weight Bearing Restrictions: No Other Position/Activity Restrictions: LSO recommended for when OOB via neurosx note, but no LSO in room and no order in order set at this time. Care team notified by this author via secure chat.      Mobility Bed  Mobility Overal bed mobility: Needs Assistance Bed Mobility: Sidelying to Sit;Sit to Sidelying   Sidelying to sit: Max assist     Sit to sidelying: Max assist;Total assist General bed mobility comments: increased time, MAX A to manage trunk, TOTAL A for back to bed to manage trunk and LEs as pt with diffiuclty sequencing.    Transfers Overall transfer level: Needs assistance   Transfers: Lateral/Scoot Transfers          Lateral/Scoot Transfers: Max assist General transfer comment: MAX A to scoot to her left towards [facility] to optimize positioning in prep for return to supine.    Balance Overall balance assessment: History of Falls;Needs assistance Sitting-balance support: Feet supported Sitting balance-Leahy Scale: Poor Sitting balance - Comments: requires MIN A at least to sustain static sitting, short (~10 sec) bouts of sitting using bed railing for UE support, but ultimately continues to lean posteriorly requiring assist. Postural control: Posterior lean                                 ADL either performed or assessed with clinical judgement   ADL Overall ADL's : Needs assistance/impaired                                       General ADL Comments: Requires SETUP to MIN A for supported sitting UB ADLs such as self-feeding, while in EOB sitting, requires at least MIN A  for sitting balance and to manage ADL items. MAX to TOTAL A for LB ADLs 2/2 back pain.     Vision Patient Visual Report: No change from baseline Additional Comments: difficult to formally assess d/t cognition     Perception     Praxis      Pertinent Vitals/Pain Pain Assessment: Faces Faces Pain Scale: Hurts even more Pain Location: Back Pain Descriptors / Indicators: Aching;Discomfort;Dull;Grimacing Pain Intervention(s): Limited activity within patient's tolerance;Monitored during session;Repositioned (notified RN of pt's FACES pain scale rating with mobilization and RN  prepping to administer pain meds)     Hand Dominance     Extremity/Trunk Assessment Upper Extremity Assessment Upper Extremity Assessment: Generalized weakness;Difficult to assess due to impaired cognition   Lower Extremity Assessment Lower Extremity Assessment: Generalized weakness;Difficult to assess due to impaired cognition   Cervical / Trunk Assessment Cervical / Trunk Assessment: Kyphotic   Communication Communication Communication: Other (comment) (limited verbalizations to communicate with therapist throughout session. Only verbally communicates twice when prompted)   Cognition Arousal/Alertness: Lethargic Behavior During Therapy: Flat affect Overall Cognitive Status: History of cognitive impairments - at baseline                                 General Comments: Pt only oriented to self minimally (first name, but not last or birthdate and requires increased time to communicate this to therapist) Otherwise not communicative. Follows simple one step commands with increased time ~40-50% of the session. Able to perform familiar tasks w SETUP/visual cues only such as brushing teeth, cutting up pancakes.   General Comments       Exercises     Shoulder Instructions      Home Living Family/patient expects to be discharged to:: Private residence Living Arrangements: Spouse/significant other Available Help at Discharge: Family Type of Home: House Home Access: Stairs to enter Technical brewer of Steps: 4 Entrance Stairs-Rails: None Home Layout: One level     Bathroom Shower/Tub: Tub/shower unit         Home Equipment: [place] - single point   Additional Comments: Pt's husband reports that pt has a cane, but does not use it regularly to ambulate      Prior Functioning/Environment Level of Independence: Needs assistance  Gait / Transfers Assistance Needed: Prior to most recent fall, pt was I with ambulation using a SPC intermittently ADL's /  Homemaking Assistance Needed: Pt's husband completes all the cooking and cleaning 2/2 to cognitive decline   Comments: Pt difficult to arouse and required constant VCs to maintain level of arousal during session        OT Problem List: Decreased strength;Decreased range of motion;Decreased activity tolerance;Impaired balance (sitting and/or standing);Decreased cognition;Decreased safety awareness;Decreased knowledge of use of DME or AE;Decreased knowledge of precautions;Pain      OT Treatment/Interventions: Self-care/ADL training;DME and/or AE instruction;Therapeutic activities;Balance training;Therapeutic exercise;Energy conservation;Patient/family education    OT Goals(Current goals can be found in the care plan section) Acute Rehab OT Goals Patient Stated Goal: NA - pt unable to report goal 2/2 impaired cognition OT Goal Formulation: Patient unable to participate in goal setting Time For Goal Achievement: 12/14/20 Potential to Achieve Goals: Fair ADL Goals Pt Will Perform Grooming: with supervision;sitting (unsupported sitting while performing 2-3 g/h tasks to improve static sitting balance and tolerance) Pt Will Transfer to Toilet: with min assist;stand pivot transfer;bedside commode Pt/caregiver will Perform Home Exercise Program: Increased strength;Both right and left upper extremity;With  minimal assist  OT Frequency: Min 1X/week   Barriers to D/C:            Co-evaluation              AM-PAC OT "6 Clicks" Daily Activity     Outcome Measure Help from another person eating meals?: A Little Help from another person taking care of personal grooming?: A Little Help from another person toileting, which includes using toliet, bedpan, or urinal?: A Lot Help from another person bathing (including washing, rinsing, drying)?: A Lot Help from another person to put on and taking off regular upper body clothing?: A Lot Help from another person to put on and taking off regular lower  body clothing?: Total 6 Click Score: 13   End of Session    Activity Tolerance: Patient tolerated treatment well Patient left: in bed;with call bell/phone within reach;with bed alarm set;Other (comment) (fall mat to one side of bed, breakfast tray placed on R side.)  OT Visit Diagnosis: Unsteadiness on feet (R26.81);Muscle weakness (generalized) (M62.81);History of falling (Z91.81);Pain Pain - part of body:  (low back)                Time: 2956-2130 OT Time Calculation (min): 22 min Charges:  OT General Charges $OT Visit: 1 Visit OT Evaluation $OT Eval Moderate Complexity: 1 Mod OT Treatments $Self Care/Home Management : 8-22 mins  Gerrianne Scale, MS, OTR/L ascom [phone] 11/30/20, 10:57 AM

## 2020-11-30 NOTE — Plan of Care (Signed)

## 2020-11-30 NOTE — NC FL2 (Signed)
[place] LEVEL OF CARE SCREENING TOOL     IDENTIFICATION  Patient Name: Latasha Anderson Birthdate: 1931/05/07 Sex: female Admission Date (Current Location): 11/27/2020  [place] and [place] Number:  Engineering geologist and Address:  [facility], [address] [place], [place] [address]      Provider Number: [id number]  Attending Physician Name and Address:  Jennye Boroughs, MD  Relative Name and Phone Number:  Odis Hollingshead [phone]    Current Level of Care: Hospital Recommended Level of Care: [place] Prior Approval Number:    Date Approved/Denied:   PASRR Number: [id number]  Discharge Plan: SNF    Current Diagnoses: Patient Active Problem List   Diagnosis Date Noted  . Generalized weakness 11/27/2020  . Low back pain 11/27/2020  . Frequent falls 11/27/2020  . (HFpEF) heart failure with preserved ejection fraction ([place])   . Chronic atrial fibrillation ([place]) 05/01/2015  . Atrophic vaginitis 12/15/2014  . Breast CA ([place]) 12/15/2014  . Decrease in the ability to hear 12/15/2014  . Essential (primary) hypertension 12/15/2014  . Fatigue 12/15/2014  . Acid reflux 12/15/2014  . Glaucoma 12/15/2014  . Flu vaccine need 12/15/2014  . Blood glucose elevated 12/15/2014  . Arthritis, degenerative 12/15/2014  . OP (osteoporosis) 12/15/2014  . HLD (hyperlipidemia) 12/15/2014  . Pain of upper abdomen 12/15/2014  . Encounter for therapeutic drug monitoring 09/04/2013  . Preop cardiovascular exam 03/07/2013  . Hypokalemia   . Long term (current) use of anticoagulants 08/22/2012  . Atrial fibrillation ([place])   . Dyslipidemia   . Hypertension   . Breast cancer ([place])   . Shortness of breath   . Chest pain   . Ejection fraction   . Mitral regurgitation   . SHINGLES 07/26/2010    Orientation RESPIRATION BLADDER Height & Weight     Self,Place  Normal Incontinent Weight: 55.3 kg Height:  5\' 6"  (167.6 cm)   BEHAVIORAL SYMPTOMS/MOOD NEUROLOGICAL BOWEL NUTRITION STATUS      Continent Diet (2 gram sodium)  AMBULATORY STATUS COMMUNICATION OF NEEDS Skin   Extensive Assist Verbally Normal                       Personal Care Assistance Level of Assistance  Bathing,Feeding,Dressing Bathing Assistance: Limited assistance Feeding assistance: Limited assistance Dressing Assistance: Limited assistance     Functional Limitations Info             SPECIAL CARE FACTORS FREQUENCY  PT (By licensed PT)     PT Frequency: 5 times per week              Contractures Contractures Info: Not present    Additional Factors Info  Code Status,Allergies Code Status Info: DNR Allergies Info: Ciprofloxacin, Cortisone, Nitrofurantoin Monohyd Macro, Other           Current Medications (11/30/2020):  This is the current hospital active medication list Current Facility-Administered Medications  Medication Dose Route Frequency Provider Last Rate Last Admin  . 0.9 %  sodium chloride infusion  250 mL Intravenous PRN Agbata, Tochukwu, MD      . atorvastatin (LIPITOR) tablet 10 mg  10 mg Oral Daily Agbata, Tochukwu, MD   10 mg at 11/30/20 0950  . cholecalciferol (VITAMIN D3) tablet 1,000 Units  1,000 Units Oral Daily Agbata, Tochukwu, MD   1,000 Units at 11/30/20 0950  . furosemide (LASIX) tablet 20 mg  20 mg Oral Daily Agbata, Tochukwu, MD   20  mg at 11/30/20 0950  . haloperidol lactate (HALDOL) injection 2 mg  2 mg Intramuscular Q6H PRN Jennye Boroughs, MD      . latanoprost (XALATAN) 0.005 % ophthalmic solution 1 drop  1 drop Both Eyes Daily Agbata, Tochukwu, MD   1 drop at 11/30/20 0951  . methocarbamol (ROBAXIN) 500 mg in dextrose 5 % 50 mL IVPB  500 mg Intravenous Q6H PRN Agbata, Tochukwu, MD      . ondansetron (ZOFRAN) tablet 4 mg  4 mg Oral Q6H PRN Agbata, Tochukwu, MD       Or  . ondansetron (ZOFRAN) injection 4 mg  4 mg Intravenous Q6H PRN Agbata, Tochukwu, MD      . QUEtiapine (SEROQUEL)  tablet 25 mg  25 mg Oral QHS Jennye Boroughs, MD   25 mg at 11/29/20 2316  . sodium chloride flush (NS) 0.9 % injection 3 mL  3 mL Intravenous Q12H Agbata, Tochukwu, MD   3 mL at 11/29/20 2316  . sodium chloride flush (NS) 0.9 % injection 3 mL  3 mL Intravenous PRN Agbata, Tochukwu, MD      . timolol (TIMOPTIC) 0.5 % ophthalmic solution 1 drop  1 drop Both Eyes Daily Agbata, Tochukwu, MD   1 drop at 11/29/20 0829  . traMADol (ULTRAM) tablet 50 mg  50 mg Oral Q8H PRN Agbata, Tochukwu, MD   50 mg at 11/30/20 4818     Discharge Medications: Please see discharge summary for a list of discharge medications.  Relevant Imaging Results:  Relevant Lab Results:   Additional Information SS# [id number]  Su Hilt, RN

## 2020-12-01 DIAGNOSIS — I5032 Chronic diastolic (congestive) heart failure: Secondary | ICD-10-CM | POA: Diagnosis not present

## 2020-12-01 DIAGNOSIS — I482 Chronic atrial fibrillation, unspecified: Secondary | ICD-10-CM | POA: Diagnosis not present

## 2020-12-01 DIAGNOSIS — M545 Low back pain, unspecified: Secondary | ICD-10-CM | POA: Diagnosis not present

## 2020-12-01 DIAGNOSIS — I10 Essential (primary) hypertension: Secondary | ICD-10-CM | POA: Diagnosis not present

## 2020-12-01 LAB — PROTIME-INR
INR: 1.7 — ABNORMAL HIGH (ref 0.8–1.2)
Prothrombin Time: 19.9 seconds — ABNORMAL HIGH (ref 11.4–15.2)

## 2020-12-01 NOTE — Plan of Care (Signed)
  Problem: Education: Goal: Knowledge of General Education information will improve Description: Including pain rating scale, medication(s)/side effects and non-pharmacologic comfort measures Outcome: Not Progressing   

## 2020-12-01 NOTE — Progress Notes (Signed)
Progress Note    Latasha Anderson  UJW:[id number] DOB: 1930-09-30  DOA: 11/27/2020 PCP: Jerrol Banana., MD      Brief Narrative:    Medical records reviewed and are as summarized below:  Latasha Anderson is a 87 y.o. female with medical history significant for dementia, atrial fibrillation on Coumadin, hypertension, chronic diastolic CHF, who presented to the hospital because of 2-week history of back pain.  There is a history of fall. Lumbar spine x-ray showed age-indeterminate compression fracture involving T12 and L1.  Neurosurgery was consulted to assist with management.  He recommended LSO brace and conservative management.     Assessment/Plan:   Principal Problem:   Low back pain Active Problems:   Atrial fibrillation (HCC)   Essential (primary) hypertension   Chronic atrial fibrillation (HCC)   Generalized weakness   Frequent falls   (HFpEF) heart failure with preserved ejection fraction (HCC)   Body mass index is 19.69 kg/m.   Age-indeterminate T12 and L1 compression fracture, low back pain: Continue analgesics.  Neurosurgery recommended LSO brace.  Chronic atrial fibrillation with supratherapeutic INR: INR is down to1.7.  Discussed long-term anticoagulation with her husband.  Risks and benefits were discussed.  He was not sure of what to do and he said we should do what is best for the patient.  He is leaning towards discontinuing Coumadin at discharge.  Hypokalemia: Improved  Dementia with behavioral problem, delirium and combativeness: Haldol as needed for agitation.  Continue Seroquel.  Continue supportive care.  Generalized weakness, frequent falls at home: Fall precautions.  Continue PT and OT.  Vaginal bleeding: No reported vaginal bleeding.  Hypotension: Hold amlodipine and benazepril.  Monitor BP closely.  Urinary retention: Foley catheter was placed on 11/29/2020 for acute urinary retention.  She had previously required in and out  urethral catheterization.  Awaiting placement to SNF.   Diet Order            Diet 2 gram sodium Room service appropriate? Yes; Fluid consistency: Thin  Diet effective now                    Consultants:  Neurosurgeon, Dr. Cari Caraway  Procedures:  None    Medications:   . atorvastatin  10 mg Oral Daily  . cholecalciferol  1,000 Units Oral Daily  . furosemide  20 mg Oral Daily  . latanoprost  1 drop Both Eyes Daily  . QUEtiapine  25 mg Oral QHS  . sodium chloride flush  3 mL Intravenous Q12H  . timolol  1 drop Both Eyes Daily   Continuous Infusions: . sodium chloride    . methocarbamol (ROBAXIN) IV       Anti-infectives (From admission, onward)   None             Family Communication/Anticipated D/C date and plan/Code Status   DVT prophylaxis:      Code Status: DNR  Family Communication: Husband at the bedside Disposition Plan:    Status is: Inpatient  Remains inpatient appropriate because:Inpatient level of care appropriate due to severity of illness   Dispo: The patient is from: Home              Anticipated d/c is to: SNF              Patient currently is not medically stable to d/c.   Difficult to place patient No  Subjective:   Interval events noted.  She is unable to provide any history because of confusion.  Her husband was at the bedside.  Objective:    Vitals:   11/30/20 2045 11/30/20 2330 12/01/20 0753 12/01/20 1157  BP: 114/74 119/60 102/62 119/69  Pulse: 94 83 81 93  Resp: 17 18 16 15   Temp: 98.1 F (36.7 C) 97.8 F (36.6 C) 98.2 F (36.8 C) 98.6 F (37 C)  TempSrc: Oral  Oral Oral  SpO2: 97%  100% 96%  Weight:      Height:       No data found.   Intake/Output Summary (Last 24 hours) at 12/01/2020 1230 Last data filed at 12/01/2020 0207 Gross per 24 hour  Intake --  Output 250 ml  Net -250 ml   Filed Weights   11/26/20 2047  Weight: 55.3 kg    Exam:  GEN: NAD SKIN:  Warm and dry.  Bruises on bilateral upper extremities EYES: No pallor or icterus ENT: MMM CV: RRR PULM: CTA B ABD: soft, ND, NT, +BS CNS: Alert but disoriented, non focal EXT: No edema or tenderness GU: Foley catheter draining amber urine          Data Reviewed:   I have personally reviewed following labs and imaging studies:  Labs: Labs show the following:   Basic Metabolic Panel: Recent Labs  Lab 11/26/20 2050 11/28/20 0533 11/29/20 0804  NA 137 138 135  K 3.5 3.3* 3.8  CL 94* 98 97*  CO2 32 32 30  GLUCOSE 155* 113* 116*  BUN 12 20 20   CREATININE 0.64 0.61 0.50  CALCIUM 9.6 8.6* 8.5*  MG  --  1.7  --   PHOS  --  3.6  --    GFR Estimated Creatinine Clearance: 42.4 mL/min (by C-G formula based on SCr of 0.5 mg/dL). Liver Function Tests: Recent Labs  Lab 11/26/20 2050  AST 23  ALT 20  ALKPHOS 52  BILITOT 1.1  PROT 6.3*  ALBUMIN 3.8   No results for input(s): LIPASE, AMYLASE in the last 168 hours. No results for input(s): AMMONIA in the last 168 hours. Coagulation profile Recent Labs  Lab 11/27/20 0155 11/28/20 0533 11/29/20 0804 11/30/20 0619 12/01/20 0422  INR 3.7* 4.6* 3.3* 2.2* 1.7*    CBC: Recent Labs  Lab 11/26/20 2050 11/28/20 0533 11/29/20 0804  WBC 13.8*  14.0* 11.5* 11.8*  NEUTROABS 10.6*  --  8.5*  HGB 14.5  14.5 10.9* 9.3*  HCT 42.6  42.1 31.6* 26.9*  MCV 94.9  93.6 93.8 94.1  PLT 308  289 235 237   Cardiac Enzymes: No results for input(s): CKTOTAL, CKMB, CKMBINDEX, TROPONINI in the last 168 hours. BNP (last 3 results) No results for input(s): PROBNP in the last 8760 hours. CBG: No results for input(s): GLUCAP in the last 168 hours. D-Dimer: No results for input(s): DDIMER in the last 72 hours. Hgb A1c: No results for input(s): HGBA1C in the last 72 hours. Lipid Profile: No results for input(s): CHOL, HDL, LDLCALC, TRIG, CHOLHDL, LDLDIRECT in the last 72 hours. Thyroid function studies: No results for  input(s): TSH, T4TOTAL, T3FREE, THYROIDAB in the last 72 hours.  Invalid input(s): FREET3 Anemia work up: No results for input(s): VITAMINB12, FOLATE, FERRITIN, TIBC, IRON, RETICCTPCT in the last 72 hours. Sepsis Labs: Recent Labs  Lab 11/26/20 2050 11/28/20 0533 11/29/20 0804  WBC 13.8*  14.0* 11.5* 11.8*    Microbiology Recent Results (from the past 240 hour(s))  Resp  Panel by RT-PCR (Flu A&B, Covid) Nasopharyngeal Swab     Status: None   Collection Time: 11/27/20  1:57 AM   Specimen: Nasopharyngeal Swab; Nasopharyngeal(NP) swabs in vial transport medium  Result Value Ref Range Status   SARS Coronavirus 2 by RT PCR NEGATIVE NEGATIVE Final    Comment: (NOTE) SARS-CoV-2 target nucleic acids are NOT DETECTED.  The SARS-CoV-2 RNA is generally detectable in upper respiratory specimens during the acute phase of infection. The lowest concentration of SARS-CoV-2 viral copies this assay can detect is 138 copies/mL. A negative result does not preclude SARS-Cov-2 infection and should not be used as the sole basis for treatment or other patient management decisions. A negative result may occur with  improper specimen collection/handling, submission of specimen other than nasopharyngeal swab, presence of viral mutation(s) within the areas targeted by this assay, and inadequate number of viral copies(<138 copies/mL). A negative result must be combined with clinical observations, patient history, and epidemiological information. The expected result is Negative.  Fact Sheet for Patients:  EntrepreneurPulse.com.au  Fact Sheet for Healthcare Providers:  IncredibleEmployment.be  This test is no t yet approved or cleared by the [place] FDA and  has been authorized for detection and/or diagnosis of SARS-CoV-2 by FDA under an Emergency Use Authorization (EUA). This EUA will remain  in effect (meaning this test can be used) for the duration of  the COVID-19 declaration under Section 564(b)(1) of the Act, 21 U.S.C.section 360bbb-3(b)(1), unless the authorization is terminated  or revoked sooner.       Influenza A by PCR NEGATIVE NEGATIVE Final   Influenza B by PCR NEGATIVE NEGATIVE Final    Comment: (NOTE) The Xpert Xpress SARS-CoV-2/FLU/RSV plus assay is intended as an aid in the diagnosis of influenza from Nasopharyngeal swab specimens and should not be used as a sole basis for treatment. Nasal washings and aspirates are unacceptable for Xpert Xpress SARS-CoV-2/FLU/RSV testing.  Fact Sheet for Patients: EntrepreneurPulse.com.au  Fact Sheet for Healthcare Providers: IncredibleEmployment.be  This test is not yet approved or cleared by the [place] FDA and has been authorized for detection and/or diagnosis of SARS-CoV-2 by FDA under an Emergency Use Authorization (EUA). This EUA will remain in effect (meaning this test can be used) for the duration of the COVID-19 declaration under Section 564(b)(1) of the Act, 21 U.S.C. section 360bbb-3(b)(1), unless the authorization is terminated or revoked.  Performed at [facility], [address] [place], [place] [address]     Procedures and diagnostic studies:  No results found.             LOS: 1 day   Latasha Anderson  [facility]   Pager on [email or web address]. If 7PM-7AM, please contact night-coverage at [email or web address]     12/01/2020, 12:30 PM

## 2020-12-01 NOTE — TOC Progression Note (Addendum)
Transition of Care [facility]) - Progression Note    Patient Details  Name: Latasha Anderson MRN: [id number] Date of Birth: 09-24-1930  Transition of Care [facility]) CM/SW [place], RN Phone Number: 12/01/2020, 9:51 AM  Clinical Narrative:    Spoke with the patient's husband in the room and reviewed the bed choices, He choise [name], I [facility] and left a VM as well as accepted in the Hub, Started ins auth process, awaiting approval, ref number [phone] [id number]       Expected Discharge Plan and Services                                                 Social Determinants of Health (SDOH) Interventions    Readmission Risk Interventions No flowsheet data found.

## 2020-12-01 NOTE — Progress Notes (Signed)
PT Cancellation Note  Patient Details Name: Latasha Anderson MRN: [id number] DOB: Jan 22, 1931   Cancelled Treatment:     PT attempt. Pt was supine in bed upon arriving with BUE mitts donned. Pt unwilling to get OOB currently due to pain. Pt is severely confused/disoriented. Called vendor with brace order for quickdraw LSO. Plan is for pt to DC to SNF tomorrow.    Willette Pa 12/01/2020, 2:57 PM

## 2020-12-02 LAB — RESP PANEL BY RT-PCR (FLU A&B, COVID) ARPGX2
Influenza A by PCR: NEGATIVE
Influenza B by PCR: NEGATIVE
SARS Coronavirus 2 by RT PCR: NEGATIVE

## 2020-12-02 MED ORDER — AMLODIPINE BESYLATE 5 MG PO TABS: 5.0000 mg | ORAL_TABLET | Freq: Every day | ORAL | 0 refills | Status: DC

## 2020-12-02 MED ORDER — QUETIAPINE FUMARATE 25 MG PO TABS: 25.0000 mg | ORAL_TABLET | Freq: Every day | ORAL | 0 refills | Status: AC

## 2020-12-02 MED ORDER — AMLODIPINE BESYLATE 5 MG PO TABS
5.0000 mg | ORAL_TABLET | Freq: Every day | ORAL | Status: DC
  Administered 2020-12-02: 5 mg via ORAL
  Filled 2020-12-02: qty 1

## 2020-12-02 NOTE — TOC Progression Note (Signed)
Transition of Care [facility]) - Progression Note    Patient Details  Name: Latasha Anderson MRN: [id number] Date of Birth: 10/22/1930  Transition of Care [facility]) CM/SW [place], RN Phone Number: 12/02/2020, 2:02 PM  Clinical Narrative:   Notified the patient's husband in the room that the patient is going to be going to [place] with EMS to transport, [facility] to transport, notified the nurse         Expected Discharge Plan and Services           Expected Discharge Date: 12/02/20                                     Social Determinants of Health (SDOH) Interventions    Readmission Risk Interventions No flowsheet data found.

## 2020-12-02 NOTE — Progress Notes (Signed)
Discharge Note: Reported to Facility nurse at [facility]. Spoke to [employer], Therapist, sports. Reviewed discharge instructions. Pt d/ced with LSO back brace. Reiterated pt must have brace on when out of bed. Facility nurse verbalized understanding. Obtained vitals. IV cath intact upon removal. Pt transferred to facility via EMS.

## 2020-12-02 NOTE — Plan of Care (Signed)
  Problem: Education: Goal: Knowledge of General Education information will improve Description Including pain rating scale, medication(s)/side effects and non-pharmacologic comfort measures Outcome: Progressing   

## 2020-12-02 NOTE — TOC Transition Note (Signed)
Transition of Care [facility]) - CM/SW Discharge Note   Patient Details  Name: Latasha Anderson MRN: [id number] Date of Birth: 06/13/1931  Transition of Care [facility]) CM/SW Contact:  Su Hilt, RN Phone Number: 12/02/2020, 10:19 AM   Clinical Narrative:   The patient to go to [facility] today room [place], the nurse to call report to [phone]         Patient Goals and CMS Choice        Discharge Placement                       Discharge Plan and Services                                     Social Determinants of Health (SDOH) Interventions     Readmission Risk Interventions No flowsheet data found.

## 2020-12-02 NOTE — Discharge Summary (Signed)
Latasha Anderson at [place]    MR#:  [id number]  DATE OF BIRTH:  1930/11/29  DATE OF ADMISSION:  11/27/2020 ADMITTING PHYSICIAN: Athena Masse, MD  DATE OF DISCHARGE: 12/02/2020  PRIMARY CARE PHYSICIAN: Jerrol Banana., MD    ADMISSION DIAGNOSIS:  Weakness generalized [R53.1] Gait instability [R26.81] Generalized weakness [R53.1] Fall, initial encounter [W19.XXXA] Acute midline low back pain without sciatica [M54.50]  DISCHARGE DIAGNOSIS:  T12/L1 compression fracture age indeterminate-- conservative management dementia with behavioral problem chronic a fib-- now of anticoagulation due to frequent falls generalized weakness  SECONDARY DIAGNOSIS:   Past Medical History:  Diagnosis Date  . (HFpEF) heart failure with preserved ejection fraction ([place])    a. EF 55-65%, January, 2012, question diastolic dysfunction; b. 96/7893 Echo: EF 55-60%, no rwma. Mild MR. Mildly dil LA. Nl RV fxn. PASP 30mmHg.  . Breast cancer ([place]) 1985   RT MASTECTOMY  . Chest pain   . Dyslipidemia   . Hypertension   . Hypokalemia    February, 2014, potassium started  . Mitral regurgitation    Mild, echo, 2012  . Permanent atrial fibrillation ([place])    a. Dx 08/2012. CHA2DSD2VASc = 4-5-->warfarin.  . Shingles   . Shortness of breath    January, 8101, normal systolic function, question diastolic dysfunction    HOSPITAL COURSE:  Latasha Anderson is a 87 y.o. female with medical history significant for dementia, atrial fibrillation on Coumadin, hypertension, chronic diastolic CHF, who presented to the hospital because of 2-week history of back pain.  There is a history of fall.  Age-indeterminate T12 and L1 compression fracture, low back pain: --Continue analgesics.   --Neurosurgery recommended LSO brace.  Chronic atrial fibrillation with supratherapeutic INR: INR is down to1.7.  Discussed long-term anticoagulation with her husband.  Risks  and benefits were discussed.  He is leaning towards discontinuing Coumadin at discharge given pts h/o fall and at risk for it in future.   history of chronic diastolic congestive heart failure. -- Well compensated -- resume Lasix -- holding BP meds due to soft/stable blood pressure  Hypokalemia: Improved  Dementia with behavioral problem, delirium and combativeness: Haldol as needed for agitation.  Continue Seroquel.  Continue supportive care.  Generalized weakness, frequent falls at home: Fall precautions.  Continue PT and OT--recommends rehab  H/o HTN with soft BP -- Hold  benazepril at discharge. SNF to resume if BP elevates  Urinary retention: Foley catheter was placed on 11/29/2020 for acute urinary retention.  She had previously required in and out urethral catheterization --suspect due to dementia - consider in and out at SNF if needed --pt may benefit from out pt Urology f/u  Overall at baseline D/c to [facility]  CONSULTS OBTAINED:    DRUG ALLERGIES:   Allergies  Allergen Reactions  . Ciprofloxacin Nausea And Vomiting  . Cortisone     Pt stated this makes her turn blue  . Nitrofurantoin Monohyd Macro     GI upset & chills.  . Other     Mycins    DISCHARGE MEDICATIONS:   Allergies as of 12/02/2020      Reactions   Ciprofloxacin Nausea And Vomiting   Cortisone    Pt stated this makes her turn blue   Nitrofurantoin Monohyd Macro    GI upset & chills.   Other    Mycins      Medication List    STOP taking these medications  amLODipine-benazepril 5-40 MG capsule Commonly known as: LOTREL   warfarin 2.5 MG tablet Commonly known as: COUMADIN     TAKE these medications   alendronate 70 MG tablet Commonly known as: FOSAMAX TAKE 1 TABLET (70 MG TOTAL) BY MOUTH EVERY 7 (SEVEN) DAYS. TAKE WITH A FULL GLASS OF WATER ON AN EMPTY STOMACH.   amLODipine 5 MG tablet Commonly known as: NORVASC Take 1 tablet (5 mg total) by mouth daily.   atorvastatin  10 MG tablet Commonly known as: LIPITOR TAKE 1 TABLET BY MOUTH EVERY DAY   Biotin 10000 MCG Tabs Take 1 tablet by mouth daily.   cholecalciferol 1000 units tablet Commonly known as: VITAMIN D Take 1,000 Units by mouth daily.   furosemide 20 MG tablet Commonly known as: LASIX TAKE 1 TABLET BY MOUTH EVERY DAY   latanoprost 0.005 % ophthalmic solution Commonly known as: XALATAN Place 1 drop into both eyes daily.   QUEtiapine 25 MG tablet Commonly known as: SEROQUEL Take 1 tablet (25 mg total) by mouth at bedtime.   timolol 0.5 % ophthalmic solution Commonly known as: TIMOPTIC Place 1 drop into both eyes daily.   TYLENOL PO Take by mouth as needed.       If you experience worsening of your admission symptoms, develop shortness of breath, life threatening emergency, suicidal or homicidal thoughts you must seek medical attention immediately by calling 911 or calling your MD immediately  if symptoms less severe.  You Must read complete instructions/literature along with all the possible adverse reactions/side effects for all the Medicines you take and that have been prescribed to you. Take any new Medicines after you have completely understood and accept all the possible adverse reactions/side effects.   Please note  You were cared for by a hospitalist during your hospital stay. If you have any questions about your discharge medications or the care you received while you were in the hospital after you are discharged, you can call the unit and asked to speak with the hospitalist on call if the hospitalist that took care of you is not available. Once you are discharged, your primary care physician will handle any further medical issues. Please note that NO REFILLS for any discharge medications will be authorized once you are discharged, as it is imperative that you return to your primary care physician (or establish a relationship with a primary care physician if you do not have one)  for your aftercare needs so that they can reassess your need for medications and monitor your lab values. Today   SUBJECTIVE   Pleasant this am Mr Morr in the room patient has intermittent issues with agitation/behavior according to RN  VITAL SIGNS:  Blood pressure 120/70, pulse 76, temperature 97.9 F (36.6 C), temperature source Oral, resp. rate 15, height 5\' 6"  (1.676 m), weight 55.3 kg, SpO2 95 %.  I/O:    Intake/Output Summary (Last 24 hours) at 12/02/2020 1007 Last data filed at 12/01/2020 1800 Gross per 24 hour  Intake --  Output 650 ml  Net -650 ml    PHYSICAL EXAMINATION:  GENERAL:  87 y.o.-year-old patient lying in the bed with no acute distress.  LUNGS: Normal breath sounds bilaterally, no wheezing, rales,rhonchi or crepitation. No use of accessory muscles of respiration.  CARDIOVASCULAR: S1, S2 normal. No murmurs, rubs, or gallops.  ABDOMEN: Soft, non-tender, non-distended. Bowel sounds present. No organomegaly or mass.  EXTREMITIES: No pedal edema, cyanosis, or clubbing.  NEUROLOGIC: moves all extremities well. Nonfocal.  PSYCHIATRIC: The  patient is alert and confused at baseline SKIN: No obvious rash, lesion, or ulcer.   DATA REVIEW:   CBC  Recent Labs  Lab 11/29/20 0804  WBC 11.8*  HGB 9.3*  HCT 26.9*  PLT 237    Chemistries  Recent Labs  Lab 11/26/20 2050 11/28/20 0533 11/29/20 0804  NA 137 138 135  K 3.5 3.3* 3.8  CL 94* 98 97*  CO2 32 32 30  GLUCOSE 155* 113* 116*  BUN 12 20 20   CREATININE 0.64 0.61 0.50  CALCIUM 9.6 8.6* 8.5*  MG  --  1.7  --   AST 23  --   --   ALT 20  --   --   ALKPHOS 52  --   --   BILITOT 1.1  --   --     Microbiology Results   Recent Results (from the past 240 hour(s))  Resp Panel by RT-PCR (Flu A&B, Covid) Nasopharyngeal Swab     Status: None   Collection Time: 11/27/20  1:57 AM   Specimen: Nasopharyngeal Swab; Nasopharyngeal(NP) swabs in vial transport medium  Result Value Ref Range Status   SARS  Coronavirus 2 by RT PCR NEGATIVE NEGATIVE Final    Comment: (NOTE) SARS-CoV-2 target nucleic acids are NOT DETECTED.  The SARS-CoV-2 RNA is generally detectable in upper respiratory specimens during the acute phase of infection. The lowest concentration of SARS-CoV-2 viral copies this assay can detect is 138 copies/mL. A negative result does not preclude SARS-Cov-2 infection and should not be used as the sole basis for treatment or other patient management decisions. A negative result may occur with  improper specimen collection/handling, submission of specimen other than nasopharyngeal swab, presence of viral mutation(s) within the areas targeted by this assay, and inadequate number of viral copies(<138 copies/mL). A negative result must be combined with clinical observations, patient history, and epidemiological information. The expected result is Negative.  Fact Sheet for Patients:  EntrepreneurPulse.com.au  Fact Sheet for Healthcare Providers:  IncredibleEmployment.be  This test is no t yet approved or cleared by the [place] FDA and  has been authorized for detection and/or diagnosis of SARS-CoV-2 by FDA under an Emergency Use Authorization (EUA). This EUA will remain  in effect (meaning this test can be used) for the duration of the COVID-19 declaration under Section 564(b)(1) of the Act, 21 U.S.C.section 360bbb-3(b)(1), unless the authorization is terminated  or revoked sooner.       Influenza A by PCR NEGATIVE NEGATIVE Final   Influenza B by PCR NEGATIVE NEGATIVE Final    Comment: (NOTE) The Xpert Xpress SARS-CoV-2/FLU/RSV plus assay is intended as an aid in the diagnosis of influenza from Nasopharyngeal swab specimens and should not be used as a sole basis for treatment. Nasal washings and aspirates are unacceptable for Xpert Xpress SARS-CoV-2/FLU/RSV testing.  Fact Sheet for  Patients: EntrepreneurPulse.com.au  Fact Sheet for Healthcare Providers: IncredibleEmployment.be  This test is not yet approved or cleared by the [place] FDA and has been authorized for detection and/or diagnosis of SARS-CoV-2 by FDA under an Emergency Use Authorization (EUA). This EUA will remain in effect (meaning this test can be used) for the duration of the COVID-19 declaration under Section 564(b)(1) of the Act, 21 U.S.C. section 360bbb-3(b)(1), unless the authorization is terminated or revoked.  Performed at [facility], [address] [place],  [address]     RADIOLOGY:  No results found.   CODE STATUS:     Code Status Orders  (  From admission, onward)         Start     Ordered   11/27/20 1539  Do not attempt resuscitation (DNR)  Continuous       Question Answer Comment  In the event of cardiac or respiratory ARREST Do not call a "code blue"   In the event of cardiac or respiratory ARREST Do not perform Intubation, CPR, defibrillation or ACLS   In the event of cardiac or respiratory ARREST Use medication by any route, position, wound care, and other measures to relive pain and suffering. May use oxygen, suction and manual treatment of airway obstruction as needed for comfort.   Comments Code status was discussed with patient's husband and daughter over the phone and she is a DNR      11/27/20 1539        Code Status History    Date Active Date Inactive Code Status Order ID Comments User Context   11/27/2020 0835 11/27/2020 1539 Full Code [id number]  Collier Bullock, MD ED   Advance Care Planning Activity       TOTAL TIME TAKING CARE OF THIS PATIENT: *35* minutes.    Fritzi Mandes M.D  [facility]    CC: Primary care physician; Jerrol Banana., MD

## 2020-12-02 NOTE — Discharge Instructions (Signed)
In and out catheter bid prn [facility] to see if issues with urine retention continues

## 2020-12-06 ENCOUNTER — Encounter (HOSPITAL_COMMUNITY)
Admission: EM | Disposition: A | Discharge status: Skilled Nursing Facility | Payer: Self-pay | Source: Skilled Nursing Facility | Attending: Internal Medicine

## 2020-12-06 ENCOUNTER — Emergency Department (HOSPITAL_COMMUNITY): Payer: Medicare PPO

## 2020-12-06 ENCOUNTER — Inpatient Hospital Stay (HOSPITAL_COMMUNITY): Payer: Medicare PPO | Admitting: Certified Registered"

## 2020-12-06 ENCOUNTER — Inpatient Hospital Stay (HOSPITAL_COMMUNITY)
Admission: EM | Admit: 2020-12-06 | Discharge: 2020-12-11 | DRG: 522 | Disposition: A | Discharge status: Skilled Nursing Facility | Payer: Medicare PPO | Source: Skilled Nursing Facility | Attending: Internal Medicine | Admitting: Internal Medicine

## 2020-12-06 ENCOUNTER — Other Ambulatory Visit: Payer: Self-pay

## 2020-12-06 ENCOUNTER — Encounter (HOSPITAL_COMMUNITY): Payer: Self-pay

## 2020-12-06 DIAGNOSIS — Y92129 Unspecified place in nursing home as the place of occurrence of the external cause: Secondary | ICD-10-CM | POA: Diagnosis not present

## 2020-12-06 DIAGNOSIS — Z853 Personal history of malignant neoplasm of breast: Secondary | ICD-10-CM | POA: Diagnosis not present

## 2020-12-06 DIAGNOSIS — Z8781 Personal history of (healed) traumatic fracture: Secondary | ICD-10-CM

## 2020-12-06 DIAGNOSIS — R339 Retention of urine, unspecified: Secondary | ICD-10-CM | POA: Diagnosis present

## 2020-12-06 DIAGNOSIS — M4856XA Collapsed vertebra, not elsewhere classified, lumbar region, initial encounter for fracture: Secondary | ICD-10-CM | POA: Diagnosis present

## 2020-12-06 DIAGNOSIS — I272 Pulmonary hypertension, unspecified: Secondary | ICD-10-CM | POA: Diagnosis present

## 2020-12-06 DIAGNOSIS — I5032 Chronic diastolic (congestive) heart failure: Secondary | ICD-10-CM | POA: Diagnosis present

## 2020-12-06 DIAGNOSIS — M818 Other osteoporosis without current pathological fracture: Secondary | ICD-10-CM | POA: Diagnosis present

## 2020-12-06 DIAGNOSIS — E876 Hypokalemia: Secondary | ICD-10-CM | POA: Diagnosis present

## 2020-12-06 DIAGNOSIS — Z515 Encounter for palliative care: Secondary | ICD-10-CM | POA: Diagnosis not present

## 2020-12-06 DIAGNOSIS — E785 Hyperlipidemia, unspecified: Secondary | ICD-10-CM | POA: Diagnosis present

## 2020-12-06 DIAGNOSIS — Z7983 Long term (current) use of bisphosphonates: Secondary | ICD-10-CM

## 2020-12-06 DIAGNOSIS — Z20822 Contact with and (suspected) exposure to covid-19: Secondary | ICD-10-CM | POA: Diagnosis present

## 2020-12-06 DIAGNOSIS — Z90710 Acquired absence of both cervix and uterus: Secondary | ICD-10-CM

## 2020-12-06 DIAGNOSIS — H409 Unspecified glaucoma: Secondary | ICD-10-CM | POA: Diagnosis present

## 2020-12-06 DIAGNOSIS — Z8042 Family history of malignant neoplasm of prostate: Secondary | ICD-10-CM

## 2020-12-06 DIAGNOSIS — S72002A Fracture of unspecified part of neck of left femur, initial encounter for closed fracture: Secondary | ICD-10-CM

## 2020-12-06 DIAGNOSIS — S72012A Unspecified intracapsular fracture of left femur, initial encounter for closed fracture: Secondary | ICD-10-CM | POA: Diagnosis present

## 2020-12-06 DIAGNOSIS — M25559 Pain in unspecified hip: Secondary | ICD-10-CM

## 2020-12-06 DIAGNOSIS — E871 Hypo-osmolality and hyponatremia: Secondary | ICD-10-CM | POA: Diagnosis present

## 2020-12-06 DIAGNOSIS — D72829 Elevated white blood cell count, unspecified: Secondary | ICD-10-CM | POA: Diagnosis present

## 2020-12-06 DIAGNOSIS — I340 Nonrheumatic mitral (valve) insufficiency: Secondary | ICD-10-CM | POA: Diagnosis present

## 2020-12-06 DIAGNOSIS — Z419 Encounter for procedure for purposes other than remedying health state, unspecified: Secondary | ICD-10-CM

## 2020-12-06 DIAGNOSIS — S72009A Fracture of unspecified part of neck of unspecified femur, initial encounter for closed fracture: Secondary | ICD-10-CM | POA: Diagnosis present

## 2020-12-06 DIAGNOSIS — Z7189 Other specified counseling: Secondary | ICD-10-CM | POA: Diagnosis not present

## 2020-12-06 DIAGNOSIS — I4821 Permanent atrial fibrillation: Secondary | ICD-10-CM | POA: Diagnosis present

## 2020-12-06 DIAGNOSIS — Z833 Family history of diabetes mellitus: Secondary | ICD-10-CM

## 2020-12-06 DIAGNOSIS — R296 Repeated falls: Secondary | ICD-10-CM | POA: Diagnosis present

## 2020-12-06 DIAGNOSIS — W19XXXA Unspecified fall, initial encounter: Secondary | ICD-10-CM

## 2020-12-06 DIAGNOSIS — E861 Hypovolemia: Secondary | ICD-10-CM | POA: Diagnosis present

## 2020-12-06 DIAGNOSIS — R531 Weakness: Secondary | ICD-10-CM | POA: Diagnosis present

## 2020-12-06 DIAGNOSIS — W1830XA Fall on same level, unspecified, initial encounter: Secondary | ICD-10-CM | POA: Diagnosis present

## 2020-12-06 DIAGNOSIS — Z823 Family history of stroke: Secondary | ICD-10-CM

## 2020-12-06 DIAGNOSIS — I110 Hypertensive heart disease with heart failure: Secondary | ICD-10-CM | POA: Diagnosis present

## 2020-12-06 DIAGNOSIS — Z9181 History of falling: Secondary | ICD-10-CM

## 2020-12-06 DIAGNOSIS — R17 Unspecified jaundice: Secondary | ICD-10-CM | POA: Diagnosis present

## 2020-12-06 DIAGNOSIS — Z66 Do not resuscitate: Secondary | ICD-10-CM | POA: Diagnosis present

## 2020-12-06 DIAGNOSIS — F0391 Unspecified dementia with behavioral disturbance: Secondary | ICD-10-CM | POA: Diagnosis present

## 2020-12-06 DIAGNOSIS — Z9011 Acquired absence of right breast and nipple: Secondary | ICD-10-CM

## 2020-12-06 DIAGNOSIS — D649 Anemia, unspecified: Secondary | ICD-10-CM | POA: Diagnosis present

## 2020-12-06 DIAGNOSIS — Z888 Allergy status to other drugs, medicaments and biological substances status: Secondary | ICD-10-CM

## 2020-12-06 DIAGNOSIS — Z881 Allergy status to other antibiotic agents status: Secondary | ICD-10-CM

## 2020-12-06 DIAGNOSIS — S60212A Contusion of left wrist, initial encounter: Secondary | ICD-10-CM | POA: Diagnosis present

## 2020-12-06 DIAGNOSIS — S61512A Laceration without foreign body of left wrist, initial encounter: Secondary | ICD-10-CM | POA: Diagnosis present

## 2020-12-06 DIAGNOSIS — S72042A Displaced fracture of base of neck of left femur, initial encounter for closed fracture: Secondary | ICD-10-CM | POA: Diagnosis not present

## 2020-12-06 DIAGNOSIS — M4854XA Collapsed vertebra, not elsewhere classified, thoracic region, initial encounter for fracture: Secondary | ICD-10-CM | POA: Diagnosis present

## 2020-12-06 DIAGNOSIS — Z79899 Other long term (current) drug therapy: Secondary | ICD-10-CM

## 2020-12-06 LAB — COMPREHENSIVE METABOLIC PANEL
ALT: 27 U/L (ref 0–44)
AST: 24 U/L (ref 15–41)
Albumin: 3.3 g/dL — ABNORMAL LOW (ref 3.5–5.0)
Alkaline Phosphatase: 70 U/L (ref 38–126)
Anion gap: 8 (ref 5–15)
BUN: 22 mg/dL (ref 8–23)
CO2: 32 mmol/L (ref 22–32)
Calcium: 8.9 mg/dL (ref 8.9–10.3)
Chloride: 90 mmol/L — ABNORMAL LOW (ref 98–111)
Creatinine, Ser: 0.56 mg/dL (ref 0.44–1.00)
GFR, Estimated: >60 mL/min (ref >60)
Glucose, Bld: 122 mg/dL — ABNORMAL HIGH (ref 70–99)
Potassium: 3.2 mmol/L — ABNORMAL LOW (ref 3.5–5.1)
Sodium: 130 mmol/L — ABNORMAL LOW (ref 135–145)
Total Bilirubin: 4.1 mg/dL — ABNORMAL HIGH (ref 0.3–1.2)
Total Protein: 5.9 g/dL — ABNORMAL LOW (ref 6.5–8.1)

## 2020-12-06 LAB — CBC WITH DIFFERENTIAL/PLATELET
Abs Immature Granulocytes: 0.1 10*3/uL — ABNORMAL HIGH (ref 0.00–0.07)
Basophils Absolute: 0 10*3/uL (ref 0.0–0.1)
Basophils Relative: 0 %
Eosinophils Absolute: 0 10*3/uL (ref 0.0–0.5)
Eosinophils Relative: 0 %
HCT: 31.9 % — ABNORMAL LOW (ref 36.0–46.0)
Hemoglobin: 10.7 g/dL — ABNORMAL LOW (ref 12.0–15.0)
Immature Granulocytes: 1 %
Lymphocytes Relative: 10 %
Lymphs Abs: 1.5 10*3/uL (ref 0.7–4.0)
MCH: 32.9 pg (ref 26.0–34.0)
MCHC: 33.5 g/dL (ref 30.0–36.0)
MCV: 98.2 fL (ref 80.0–100.0)
Monocytes Absolute: 1.4 10*3/uL — ABNORMAL HIGH (ref 0.1–1.0)
Monocytes Relative: 9 %
Neutro Abs: 13.1 10*3/uL — ABNORMAL HIGH (ref 1.7–7.7)
Neutrophils Relative %: 80 %
Platelets: 490 10*3/uL — ABNORMAL HIGH (ref 150–400)
RBC: 3.25 MIL/uL — ABNORMAL LOW (ref 3.87–5.11)
RDW: 15.6 % — ABNORMAL HIGH (ref 11.5–15.5)
WBC: 16.3 10*3/uL — ABNORMAL HIGH (ref 4.0–10.5)
nRBC: 0 % (ref 0.0–0.2)

## 2020-12-06 LAB — ABO/RH: ABO/RH(D): A POS

## 2020-12-06 LAB — PROTIME-INR
INR: 1 (ref 0.8–1.2)
Prothrombin Time: 13.5 seconds (ref 11.4–15.2)

## 2020-12-06 LAB — RESP PANEL BY RT-PCR (FLU A&B, COVID) ARPGX2
Influenza A by PCR: NEGATIVE
Influenza B by PCR: NEGATIVE
SARS Coronavirus 2 by RT PCR: NEGATIVE

## 2020-12-06 LAB — PREPARE RBC (CROSSMATCH)

## 2020-12-06 LAB — PHOSPHORUS: Phosphorus: 2.7 mg/dL (ref 2.5–4.6)

## 2020-12-06 LAB — MAGNESIUM: Magnesium: 1.7 mg/dL (ref 1.7–2.4)

## 2020-12-06 SURGERY — HEMIARTHROPLASTY, HIP, DIRECT ANTERIOR APPROACH, FOR FRACTURE
Anesthesia: Choice | Laterality: Left

## 2020-12-06 MED ORDER — CEFAZOLIN SODIUM-DEXTROSE 2-4 GM/100ML-% IV SOLN
INTRAVENOUS | Status: AC
  Filled 2020-12-06: qty 100

## 2020-12-06 MED ORDER — SODIUM CHLORIDE 0.9% IV SOLUTION: Freq: Once | INTRAVENOUS | Status: DC

## 2020-12-06 MED ORDER — TRANEXAMIC ACID-NACL 1000-0.7 MG/100ML-% IV SOLN
INTRAVENOUS | Status: AC
  Filled 2020-12-06: qty 100

## 2020-12-06 MED ORDER — POTASSIUM CHLORIDE 10 MEQ/100ML IV SOLN: 10.0000 meq | Freq: Once | INTRAVENOUS | Status: DC

## 2020-12-06 MED ORDER — DROPERIDOL 2.5 MG/ML IJ SOLN: 0.6250 mg | Freq: Once | INTRAMUSCULAR | Status: DC | PRN

## 2020-12-06 MED ORDER — ACETAMINOPHEN 325 MG PO TABS: 650.0000 mg | ORAL_TABLET | Freq: Four times a day (QID) | ORAL | Status: DC | PRN

## 2020-12-06 MED ORDER — DILTIAZEM HCL ER 60 MG PO CP12
60.0000 mg | ORAL_CAPSULE | Freq: Two times a day (BID) | ORAL | Status: DC
  Administered 2020-12-06 – 2020-12-11 (×8): 60 mg via ORAL
  Filled 2020-12-06 (×10): qty 1

## 2020-12-06 MED ORDER — LACTATED RINGERS IV BOLUS
1000.0000 mL | Freq: Once | INTRAVENOUS | Status: AC
  Administered 2020-12-06: 1000 mL via INTRAVENOUS

## 2020-12-06 MED ORDER — HYDROMORPHONE HCL 1 MG/ML IJ SOLN: 0.2500 mg | INTRAMUSCULAR | Status: DC | PRN

## 2020-12-06 MED ORDER — QUETIAPINE FUMARATE 25 MG PO TABS
25.0000 mg | ORAL_TABLET | Freq: Every day | ORAL | Status: DC
  Administered 2020-12-06 – 2020-12-10 (×3): 25 mg via ORAL
  Filled 2020-12-06 (×4): qty 1

## 2020-12-06 MED ORDER — TRANEXAMIC ACID-NACL 1000-0.7 MG/100ML-% IV SOLN: 1000.0000 mg | INTRAVENOUS | Status: DC

## 2020-12-06 MED ORDER — CEFAZOLIN SODIUM-DEXTROSE 2-4 GM/100ML-% IV SOLN: 2.0000 g | INTRAVENOUS | Status: DC

## 2020-12-06 MED ORDER — FENTANYL CITRATE (PF) 250 MCG/5ML IJ SOLN
INTRAMUSCULAR | Status: AC
  Filled 2020-12-06: qty 5

## 2020-12-06 MED ORDER — HYDROCODONE-ACETAMINOPHEN 5-325 MG PO TABS
1.0000 | ORAL_TABLET | Freq: Four times a day (QID) | ORAL | Status: DC | PRN
  Administered 2020-12-06: 1 via ORAL
  Filled 2020-12-06: qty 1

## 2020-12-06 MED ORDER — POTASSIUM CHLORIDE 10 MEQ/100ML IV SOLN
10.0000 meq | INTRAVENOUS | Status: AC
  Administered 2020-12-06 (×2): 10 meq via INTRAVENOUS
  Filled 2020-12-06 (×2): qty 100

## 2020-12-06 NOTE — ED Provider Notes (Signed)
[facility] DEPT Provider Note   CSN: [id number] Arrival date & time: 12/06/20  1428     History Chief Complaint  Patient presents with  . Fall  . Leg Injury    Latasha Anderson is a 87 y.o. female who presents from [facility] via EMS after left femur fracture was noted on x-ray after a fall yesterday.  Patient with history of advanced dementia, alert and oriented only to herself.  Level 5 caveat due to patient's underlying dementia.  I personally reviewed this patient's medical records.  History of hypertension, hypokalemia, A. fib, not on any anticoagulation, mitral regurg, and Heart failure with preserved ejection fracture.  PI     Past Medical History:  Diagnosis Date  . (HFpEF) heart failure with preserved ejection fraction ([place])    a. EF 55-65%, January, 2012, question diastolic dysfunction; b. 77/8242 Echo: EF 55-60%, no rwma. Mild MR. Mildly dil LA. Nl RV fxn. PASP 51mmHg.  . Breast cancer ([place]) 1985   RT MASTECTOMY  . Chest pain   . Dyslipidemia   . Hypertension   . Hypokalemia    February, 2014, potassium started  . Mitral regurgitation    Mild, echo, 2012  . Permanent atrial fibrillation ([place])    a. Dx 08/2012. CHA2DSD2VASc = 4-5-->warfarin.  . Shingles   . Shortness of breath    January, 3536, normal systolic function, question diastolic dysfunction    Patient Active Problem List   Diagnosis Date Noted  . Generalized weakness 11/27/2020  . Low back pain 11/27/2020  . Frequent falls 11/27/2020  . (HFpEF) heart failure with preserved ejection fraction ([place])   . Chronic atrial fibrillation ([place]) 05/01/2015  . Atrophic vaginitis 12/15/2014  . Breast CA ([place]) 12/15/2014  . Decrease in the ability to hear 12/15/2014  . Essential (primary) hypertension 12/15/2014  . Fatigue 12/15/2014  . Acid reflux 12/15/2014  . Glaucoma 12/15/2014  . Flu vaccine need 12/15/2014  . Blood glucose elevated 12/15/2014  . Arthritis,  degenerative 12/15/2014  . OP (osteoporosis) 12/15/2014  . HLD (hyperlipidemia) 12/15/2014  . Pain of upper abdomen 12/15/2014  . Encounter for therapeutic drug monitoring 09/04/2013  . Preop cardiovascular exam 03/07/2013  . Hypokalemia   . Long term (current) use of anticoagulants 08/22/2012  . Atrial fibrillation ([place])   . Dyslipidemia   . Hypertension   . Breast cancer ([place])   . Shortness of breath   . Chest pain   . Ejection fraction   . Mitral regurgitation   . SHINGLES 07/26/2010    Past Surgical History:  Procedure Laterality Date  . ABDOMINAL HYSTERECTOMY     age 72  . AUGMENTATION MAMMAPLASTY Right 1985   RT MASTECTOMY FOR BREAST CA  . EYE SURGERY     cataract  . MASTECTOMY Right 1985   reconstruction inplant as well     OB History   No obstetric history on file.     Family History  Problem Relation Age of Onset  . Stroke Mother   . Diabetes Father   . Cancer Brother        prostate  . Breast cancer Neg Hx     Social History   Tobacco Use  . Smoking status: Never Smoker  . Smokeless tobacco: Never Used  Vaping Use  . Vaping Use: Never used  Substance Use Topics  . Alcohol use: No    Alcohol/week: 0.0 standard drinks  . Drug use: No    Home Medications Prior  to Admission medications   Medication Sig Start Date End Date Taking? Authorizing Provider  Acetaminophen (TYLENOL PO) Take by mouth as needed.    [provider]  alendronate (FOSAMAX) 70 MG tablet TAKE 1 TABLET (70 MG TOTAL) BY MOUTH EVERY 7 (SEVEN) DAYS. TAKE WITH A FULL GLASS OF WATER ON AN EMPTY STOMACH. 10/22/20   Jerrol Banana., MD  amLODipine (NORVASC) 5 MG tablet Take 1 tablet (5 mg total) by mouth daily. 12/02/20   Fritzi Mandes, MD  atorvastatin (LIPITOR) 10 MG tablet TAKE 1 TABLET BY MOUTH EVERY DAY 10/12/20   Jerrol Banana., MD  Biotin 10000 MCG TABS Take 1 tablet by mouth daily.    [provider]  cholecalciferol (VITAMIN D) 1000 UNITS tablet  Take 1,000 Units by mouth daily.    [provider]  furosemide (LASIX) 20 MG tablet TAKE 1 TABLET BY MOUTH EVERY DAY 08/20/20   Wellington Hampshire, MD  latanoprost (XALATAN) 0.005 % ophthalmic solution Place 1 drop into both eyes daily. 08/01/12   [provider]  QUEtiapine (SEROQUEL) 25 MG tablet Take 1 tablet (25 mg total) by mouth at bedtime. 12/02/20   Fritzi Mandes, MD  timolol (TIMOPTIC) 0.5 % ophthalmic solution Place 1 drop into both eyes daily. 08/05/12   [provider]    Allergies    Ciprofloxacin, Cortisone, Nitrofurantoin monohyd macro, and Other  Review of Systems   Review of Systems  Unable to perform ROS: Dementia    Physical Exam Updated Vital Signs BP 116/62   Pulse 88   Temp 97.9 F (36.6 C) (Oral)   Resp 16   SpO2 96%   Physical Exam Vitals and nursing note reviewed. Exam conducted with a chaperone present.  Constitutional:      Appearance: She is not ill-appearing or toxic-appearing.  HENT:     Head: Normocephalic and atraumatic.     Comments: No step-offs or hematomas on the head or face, patient ranging the neck normally spontaneously.    Nose: Nose normal.     Mouth/Throat:     Mouth: Mucous membranes are moist.     Pharynx: Oropharynx is clear. Uvula midline. No oropharyngeal exudate or posterior oropharyngeal erythema.  Eyes:     General: Lids are normal. Vision grossly intact.        Right eye: No discharge.        Left eye: No discharge.     Extraocular Movements: Extraocular movements intact.     Conjunctiva/sclera: Conjunctivae normal.     Pupils: Pupils are equal, round, and reactive to light.  Neck:     Trachea: Trachea and phonation normal.  Cardiovascular:     Rate and Rhythm: Normal rate and regular rhythm.     Pulses: Normal pulses.     Heart sounds: Normal heart sounds. No murmur heard.   Pulmonary:     Effort: Pulmonary effort is normal. No tachypnea, bradypnea, accessory muscle usage, prolonged expiration  or respiratory distress.     Breath sounds: Normal breath sounds. No wheezing or rales.  Chest:     Chest wall: No mass, lacerations, deformity, swelling, tenderness, crepitus or edema.  Abdominal:     General: Bowel sounds are normal. There is no distension.     Palpations: Abdomen is soft.     Tenderness: There is no abdominal tenderness. There is no right CVA tenderness, left CVA tenderness, guarding or rebound.  Genitourinary:    Comments: No sacral wounds Musculoskeletal:  General: No deformity.       Arms:     Cervical back: Normal range of motion and neck supple. No crepitus. No pain with movement, spinous process tenderness or muscular tenderness.     Right lower leg: No edema.     Left lower leg: No edema.       Legs:     Comments:    Lymphadenopathy:     Cervical: No cervical adenopathy.  Skin:    General: Skin is warm and dry.     Capillary Refill: Capillary refill takes less than 2 seconds.     Comments: Multiple skin tears and bruising on the upper extremities, primarily left.  Neurological:     Mental Status: She is alert. Mental status is at baseline.  Psychiatric:        Mood and Affect: Mood normal.     ED Results / Procedures / Treatments   Labs (all labs ordered are listed, but only abnormal results are displayed) Labs Reviewed  RESP PANEL BY RT-PCR (FLU A&B, COVID) [id number]  COMPREHENSIVE METABOLIC PANEL  CBC WITH DIFFERENTIAL/PLATELET  URINALYSIS, ROUTINE W REFLEX MICROSCOPIC  PROTIME-INR    EKG None  Radiology No results found.  Procedures Procedures   Medications Ordered in ED Medications - No data to display  ED Course  I have reviewed the triage vital signs and the nursing notes.  Pertinent labs & imaging results that were available during my care of the patient were reviewed by me and considered in my medical decision making (see chart for details).    MDM Rules/Calculators/A&P                         87 year old female  presents with concern for left hip fracture noted on x-ray at facility, history of dementia. She is not anticoagulated.  Vital signs are normal on intake.  Cardiopulmonary exam is normal, abdominal exam is benign.  Patient with shortening and external rotation of the left hip with mild tenderness palpation without bruising.  She does have some bruising and edema over the left wrist with some associated skin tears.  Will proceed with basic laboratory studies, COVID-19 test, and CT head, films of the left hip and pelvis as well as the left wrist.  CBC with leukocytosis of 16, anemia with Hgb of 10.7, near patient baseline.  CMP with hyponatremia 130 and hypokalemia of 3.2.  No kidney injury.  Per hospitalist recommendation we will add on magnesium and phosphorus.  Coag studies are normal.  UA pending.  As per past panel pending.  CT head negative for acute cranial abnormality.  I have consulted with both orthopedics and hospitalist as above, patient will likely undergo surgery tonight or tomorrow morning for her hip fracture.  Patient's husband is now at the bedside who voiced understanding of her medical evaluation and treatment plan.  Each of his questions was answered to his expressed satisfaction.  He is amenable to plan for her to be admitted and undergo surgery.  This chart was dictated using voice recognition software, Dragon. Despite the best efforts of this provider to proofread and correct errors, errors may still occur which can change documentation meaning.  Final Clinical Impression(s) / ED Diagnoses Final diagnoses:  Hip pain    Rx / DC Orders ED Discharge Orders    None       Aura Dials 12/06/20 1651    Lacretia Leigh, MD 12/14/20 1453

## 2020-12-06 NOTE — H&P (Addendum)
History and Physical    Latasha Anderson [facility] DXI:[id number] DOB: 04-21-1931 DOA: 12/06/2020  PCP: Jerrol Banana., MD   Patient coming from: SNF  Chief Complaint: Fall and Hip Pain  HPI: Latasha Anderson is a 87 y.o. female with medical history significant for but not limited too heart failure with preserved ejection fraction, history of breast cancer status post right mastectomy, history of chest pain, dyslipidemia, hypertension, hypokalemia, permanent atrial fibrillation not on anticoagulation anymore,  history of shingles, as well as other comorbidities who was recently hospitalized last week for a fall and she was found to have age-indeterminate T12/L1 compression fractures.  Subsequently at that time she was found to have generalized weakness and her Coumadin was stopped given her recurrent falls.  She is discharged to skilled nursing facility 12/02/2020 and late last night she fell after her husband left the skilled nursing facility.  Currently she is rehabbing at [facility] and fell and x-rays at the facility revealed a femur fracture.  She presented today MS for pain and inability to ambulate.  She has rotation of her left leg and she is only alert and oriented x1 and to herself.  History is limited due to her significant dementia and most of the history is obtained from the patient's husband and ED providers and documentation.  She fell after 730 last night and complained of pain I had being at [facility].  Husband is hard of hearing himself but states that he took her off Coumadin last week given her recurrent falls.  He states that [employer] did not have any bed rails for the patient and thinks that she fell ambulating to the bathroom.  He states that she denied any other complaint and she has a left wrist deformity from prior fracture.  TRH was asked admit this patient for left hip fracture and orthopedic surgery was consulted and planning on taking the patient for surgical  intervention later this evening  ED Course: In the ED she had basic blood work done as well as a and hip x-rays and a CT scan of the head.  COVID testing was negative.  Review of Systems: As per HPI otherwise all other systems reviewed and negative.   Past Medical History:  Diagnosis Date  . (HFpEF) heart failure with preserved ejection fraction ([place])    a. EF 55-65%, January, 2012, question diastolic dysfunction; b. 76/7341 Echo: EF 55-60%, no rwma. Mild MR. Mildly dil LA. Nl RV fxn. PASP 81mmHg.  . Breast cancer ([place]) 1985   RT MASTECTOMY  . Chest pain   . Dyslipidemia   . Hypertension   . Hypokalemia    February, 2014, potassium started  . Mitral regurgitation    Mild, echo, 2012  . Permanent atrial fibrillation ([place])    a. Dx 08/2012. CHA2DSD2VASc = 4-5-->warfarin.  . Shingles   . Shortness of breath    January, 9379, normal systolic function, question diastolic dysfunction   Past Surgical History:  Procedure Laterality Date  . ABDOMINAL HYSTERECTOMY     age 75  . AUGMENTATION MAMMAPLASTY Right 1985   RT MASTECTOMY FOR BREAST CA  . EYE SURGERY     cataract  . MASTECTOMY Right 1985   reconstruction inplant as well   SOCIAL HISTORY  reports that she has never smoked. She has never used smokeless tobacco. She reports that she does not drink alcohol and does not use drugs.  Allergies  Allergen Reactions  . Ciprofloxacin Nausea And Vomiting  .  Cortisone Other (See Comments)    Pt stated this makes her turn blue  . Nitrofurantoin Monohyd Macro Other (See Comments)    GI upset & chills.  . Other Other (See Comments)    Mycins - unknown reaction   Family History  Problem Relation Age of Onset  . Stroke Mother   . Diabetes Father   . Cancer Brother        prostate  . Breast cancer Neg Hx    Prior to Admission medications   Medication Sig Start Date End Date Taking? Authorizing Provider  acetaminophen (TYLENOL) 500 MG tablet Take 500 mg by mouth every 8 (eight)  hours as needed (pain).   Yes [provider]  alendronate (FOSAMAX) 70 MG tablet TAKE 1 TABLET (70 MG TOTAL) BY MOUTH EVERY 7 (SEVEN) DAYS. TAKE WITH A FULL GLASS OF WATER ON AN EMPTY STOMACH. Patient taking differently: Take 70 mg by mouth every Wednesday. Take with a full glass of water on an empty stomach. 10/22/20  Yes Jerrol Banana., MD  atorvastatin (LIPITOR) 10 MG tablet TAKE 1 TABLET BY MOUTH EVERY DAY Patient taking differently: Take 10 mg by mouth at bedtime. 10/12/20  Yes Jerrol Banana., MD  cholecalciferol (VITAMIN D3) 25 MCG (1000 UNIT) tablet Take 1,000 Units by mouth daily at 12 noon.   Yes [provider]  diltiazem (CARDIZEM SR) 60 MG 12 hr capsule Take 60 mg by mouth every 12 (twelve) hours. Hold if BP <110/70   Yes [provider]  furosemide (LASIX) 20 MG tablet TAKE 1 TABLET BY MOUTH EVERY DAY Patient taking differently: Take 20 mg by mouth daily at 12 noon. 08/20/20  Yes Wellington Hampshire, MD  latanoprost (XALATAN) 0.005 % ophthalmic solution Place 1 drop into both eyes daily at 12 noon. 08/01/12  Yes [provider]  QUEtiapine (SEROQUEL) 25 MG tablet Take 1 tablet (25 mg total) by mouth at bedtime. 12/02/20  Yes Fritzi Mandes, MD  timolol (TIMOPTIC) 0.5 % ophthalmic solution Place 1 drop into both eyes daily at 12 noon. 08/05/12  Yes [provider]  amLODipine (NORVASC) 5 MG tablet Take 1 tablet (5 mg total) by mouth daily. Patient not taking: No sig reported 12/02/20   Fritzi Mandes, MD   Physical Exam: Vitals:   12/06/20 1437 12/06/20 1602  BP: 116/62 127/70  Pulse: 88 96  Resp: 16 16  Temp: 97.9 F (36.6 C)   TempSrc: Oral   SpO2: 96% 96%   Constitutional: Thin chronically ill-appearing Caucasian female who is demented Eyes: Lids and conjunctivae normal, sclerae anicteric  ENMT: External Ears, Nose appear normal. Grossly normal hearing.  Neck: Appears normal, supple, no cervical masses, normal ROM, no appreciable  thyromegaly; no JVD Respiratory: Diminished to auscultation bilaterally, no wheezing, rales, rhonchi or crackles. Normal respiratory effort and patient is not tachypenic. No accessory muscle use.  Unlabored breathing Cardiovascular: RRR, no murmurs / rubs / gallops. S1 and S2 auscultated.  No appreciable extremity edema Abdomen: Soft, non-tender, non-distended. Bowel sounds positive.  GU: Deferred. Musculoskeletal: No clubbing / cyanosis of digits/nails.  Left hip is externally rotated and left wrist has a deformity Skin: No rashes, lesions, ulcers on limited skin evaluation but she has bruising and ecchymosis on the upper extremities. No induration; Warm and dry.  Neurologic: CN 2-12 grossly intact with no focal deficits. Romberg sign and cerebellar reflexes not assessed.  Psychiatric: Impaired judgment and insight.  She is awake but not fully alert  and oriented x 3. Normal mood and appropriate affect.   Labs on Admission: I have personally reviewed following labs and imaging studies  CBC: Recent Labs  Lab 12/06/20 1513  WBC 16.3*  NEUTROABS 13.1*  HGB 10.7*  HCT 31.9*  MCV 98.2  PLT 967*   Basic Metabolic Panel: Recent Labs  Lab 12/06/20 1513  NA 130*  K 3.2*  CL 90*  CO2 32  GLUCOSE 122*  BUN 22  CREATININE 0.56  CALCIUM 8.9   GFR: Estimated Creatinine Clearance: 42.4 mL/min (by C-G formula based on SCr of 0.56 mg/dL). Liver Function Tests: Recent Labs  Lab 12/06/20 1513  AST 24  ALT 27  ALKPHOS 70  BILITOT 4.1*  PROT 5.9*  ALBUMIN 3.3*   No results for input(s): LIPASE, AMYLASE in the last 168 hours. No results for input(s): AMMONIA in the last 168 hours. Coagulation Profile: Recent Labs  Lab 11/30/20 0619 12/01/20 0422 12/06/20 1513  INR 2.2* 1.7* 1.0   Cardiac Enzymes: No results for input(s): CKTOTAL, CKMB, CKMBINDEX, TROPONINI in the last 168 hours. BNP (last 3 results) No results for input(s): PROBNP in the last 8760 hours. HbA1C: No results  for input(s): HGBA1C in the last 72 hours. CBG: No results for input(s): GLUCAP in the last 168 hours. Lipid Profile: No results for input(s): CHOL, HDL, LDLCALC, TRIG, CHOLHDL, LDLDIRECT in the last 72 hours. Thyroid Function Tests: No results for input(s): TSH, T4TOTAL, FREET4, T3FREE, THYROIDAB in the last 72 hours. Anemia Panel: No results for input(s): VITAMINB12, FOLATE, FERRITIN, TIBC, IRON, RETICCTPCT in the last 72 hours. Urine analysis:    Component Value Date/Time   COLORURINE YELLOW (A) 11/26/2020 2051   APPEARANCEUR HAZY (A) 11/26/2020 2051   LABSPEC 1.010 11/26/2020 2051   PHURINE 7.0 11/26/2020 2051   GLUCOSEU NEGATIVE 11/26/2020 2051   HGBUR NEGATIVE 11/26/2020 2051   BILIRUBINUR NEGATIVE 11/26/2020 2051   [place] 11/26/2020 2051   PROTEINUR NEGATIVE 11/26/2020 2051   NITRITE NEGATIVE 11/26/2020 2051   LEUKOCYTESUR NEGATIVE 11/26/2020 2051   Sepsis Labs: !!!!!!!!!!!!!!!!!!!!!!!!!!!!!!!!!!!!!!!!!!!! @LABRCNTIP (procalcitonin:4,lacticidven:4) ) Recent Results (from the past 240 hour(s))  Resp Panel by RT-PCR (Flu A&B, Covid) Nasopharyngeal Swab     Status: None   Collection Time: 11/27/20  1:57 AM   Specimen: Nasopharyngeal Swab; Nasopharyngeal(NP) swabs in vial transport medium  Result Value Ref Range Status   SARS Coronavirus 2 by RT PCR NEGATIVE NEGATIVE Final    Comment: (NOTE) SARS-CoV-2 target nucleic acids are NOT DETECTED.  The SARS-CoV-2 RNA is generally detectable in upper respiratory specimens during the acute phase of infection. The lowest concentration of SARS-CoV-2 viral copies this assay can detect is 138 copies/mL. A negative result does not preclude SARS-Cov-2 infection and should not be used as the sole basis for treatment or other patient management decisions. A negative result may occur with  improper specimen collection/handling, submission of specimen other than nasopharyngeal swab, presence of viral mutation(s) within  the areas targeted by this assay, and inadequate number of viral copies(<138 copies/mL). A negative result must be combined with clinical observations, patient history, and epidemiological information. The expected result is Negative.  Fact Sheet for Patients:  EntrepreneurPulse.com.au  Fact Sheet for Healthcare Providers:  IncredibleEmployment.be  This test is no t yet approved or cleared by the [place] FDA and  has been authorized for detection and/or diagnosis of SARS-CoV-2 by FDA under an Emergency Use Authorization (EUA). This EUA will remain  in effect (meaning this test can be used) for  the duration of the COVID-19 declaration under Section 564(b)(1) of the Act, 21 U.S.C.section 360bbb-3(b)(1), unless the authorization is terminated  or revoked sooner.       Influenza A by PCR NEGATIVE NEGATIVE Final   Influenza B by PCR NEGATIVE NEGATIVE Final    Comment: (NOTE) The Xpert Xpress SARS-CoV-2/FLU/RSV plus assay is intended as an aid in the diagnosis of influenza from Nasopharyngeal swab specimens and should not be used as a sole basis for treatment. Nasal washings and aspirates are unacceptable for Xpert Xpress SARS-CoV-2/FLU/RSV testing.  Fact Sheet for Patients: EntrepreneurPulse.com.au  Fact Sheet for Healthcare Providers: IncredibleEmployment.be  This test is not yet approved or cleared by the [place] FDA and has been authorized for detection and/or diagnosis of SARS-CoV-2 by FDA under an Emergency Use Authorization (EUA). This EUA will remain in effect (meaning this test can be used) for the duration of the COVID-19 declaration under Section 564(b)(1) of the Act, 21 U.S.C. section 360bbb-3(b)(1), unless the authorization is terminated or revoked.  Performed at [facility], [place], [place] [address]   Resp Panel by RT-PCR (Flu A&B, Covid)  Nasopharyngeal Swab     Status: None   Collection Time: 12/02/20 12:24 PM   Specimen: Nasopharyngeal Swab; Nasopharyngeal(NP) swabs in vial transport medium  Result Value Ref Range Status   SARS Coronavirus 2 by RT PCR NEGATIVE NEGATIVE Final    Comment: (NOTE) SARS-CoV-2 target nucleic acids are NOT DETECTED.  The SARS-CoV-2 RNA is generally detectable in upper respiratory specimens during the acute phase of infection. The lowest concentration of SARS-CoV-2 viral copies this assay can detect is 138 copies/mL. A negative result does not preclude SARS-Cov-2 infection and should not be used as the sole basis for treatment or other patient management decisions. A negative result may occur with  improper specimen collection/handling, submission of specimen other than nasopharyngeal swab, presence of viral mutation(s) within the areas targeted by this assay, and inadequate number of viral copies(<138 copies/mL). A negative result must be combined with clinical observations, patient history, and epidemiological information. The expected result is Negative.  Fact Sheet for Patients:  EntrepreneurPulse.com.au  Fact Sheet for Healthcare Providers:  IncredibleEmployment.be  This test is no t yet approved or cleared by the [place] FDA and  has been authorized for detection and/or diagnosis of SARS-CoV-2 by FDA under an Emergency Use Authorization (EUA). This EUA will remain  in effect (meaning this test can be used) for the duration of the COVID-19 declaration under Section 564(b)(1) of the Act, 21 U.S.C.section 360bbb-3(b)(1), unless the authorization is terminated  or revoked sooner.       Influenza A by PCR NEGATIVE NEGATIVE Final   Influenza B by PCR NEGATIVE NEGATIVE Final    Comment: (NOTE) The Xpert Xpress SARS-CoV-2/FLU/RSV plus assay is intended as an aid in the diagnosis of influenza from Nasopharyngeal swab specimens and should not be  used as a sole basis for treatment. Nasal washings and aspirates are unacceptable for Xpert Xpress SARS-CoV-2/FLU/RSV testing.  Fact Sheet for Patients: EntrepreneurPulse.com.au  Fact Sheet for Healthcare Providers: IncredibleEmployment.be  This test is not yet approved or cleared by the [place] FDA and has been authorized for detection and/or diagnosis of SARS-CoV-2 by FDA under an Emergency Use Authorization (EUA). This EUA will remain in effect (meaning this test can be used) for the duration of the COVID-19 declaration under Section 564(b)(1) of the Act, 21 U.S.C. section 360bbb-3(b)(1), unless the authorization is terminated or revoked.  Performed at [facility], [address] [place], [place] [address]     Radiological Exams on Admission: DG Wrist Complete Left  Result Date: 12/06/2020 CLINICAL DATA:  [place] yesterday EXAM: LEFT WRIST - COMPLETE 3+ VIEW COMPARISON:  None FINDINGS: Osseous demineralization. Degenerative changes at radiocarpal joint and at first [facility] joint. Marked volar tilt of distal radial articular surface and deformity of distal radial metaphysis likely sequela of remote fracture. No acute fracture or dislocation identified. IMPRESSION: Osseous demineralization with deformity of the distal LEFT radius, with marked volar tilt of distal radial articular surface, likely representing sequela of remote fracture. Degenerative changes at radiocarpal joint and first [facility] joint. No acute abnormalities. Electronically Signed   By: Lavonia Dana M.D.   On: 12/06/2020 15:56   CT Head Wo Contrast  Result Date: 12/06/2020 CLINICAL DATA:  [place] yesterday, LEFT femur fracture, head trauma, history dementia EXAM: CT HEAD WITHOUT CONTRAST TECHNIQUE: Contiguous axial images were obtained from the base of the skull through the vertex without intravenous contrast. COMPARISON:  None FINDINGS: Brain: Generalized atrophy. Normal ventricular  morphology. No midline shift or mass effect. Small vessel chronic ischemic changes of deep cerebral white matter. Otherwise normal appearance of brain parenchyma. No intracranial hemorrhage, mass lesion, or evidence of acute infarction. No extra-axial fluid collections. Vascular: No hyperdense vessels. Atherosclerotic calcification of RIGHT vertebral artery at skull base Skull: Diffuse osseous demineralization.  Intact. Sinuses/Orbits: Clear Other: N/A IMPRESSION: Atrophy with small vessel chronic ischemic changes of deep cerebral white matter. No acute intracranial abnormalities. Electronically Signed   By: Lavonia Dana M.D.   On: 12/06/2020 15:52   DG HIP UNILAT WITH PELVIS 2-3 VIEWS LEFT  Result Date: 12/06/2020 CLINICAL DATA:  LEFT femur fracture, fell yesterday EXAM: DG HIP (WITH OR WITHOUT PELVIS) 2-3V LEFT COMPARISON:  None FINDINGS: Osseous demineralization. Hip and SI joint spaces preserved. Displaced subcapital fracture of the LEFT femur. No dislocation. Pelvis intact. Degenerative disc and facet disease changes at visualized lumbar spine. IMPRESSION: Displaced subcapital fracture of the LEFT femoral neck. Electronically Signed   By: Lavonia Dana M.D.   On: 12/06/2020 15:54    EKG: No EKG was done on presentation to the ED we will order Now  Assessment/Plan Active Problems:   Hip fracture [facility])   Acute Left Femur Fracture in the setting of Osteoporosis from recent Fall -Had a Fall at [facility] -Xray there noted Femur Fx; Rpeat X-Ray here showed showed "Displaced subcapital fracture of the LEFT femoral neck." -She has shortening and external rotation of the Left Hip -Also had a Wrist Fx on the Left likely previously; X-Ray of the Left Wrist showed "Osseous demineralization with deformity of the distal LEFT radius, with marked volar tilt of distal radial articular surface, likely representing sequela of remote fracture. Degenerative changes at radiocarpal joint and first [facility] joint. No acute  abnormalities." -PT-INR was 13.5/1.0 -Check U/A to r/o Infection given falls  -Check Vitamin D Level and C/w Cholecalciferol 1000 units daily  -Hold Alendronate 70 mg qWeekly  -Give Tranexamic Acid per Protocol -EDP spoke with Orthopedics Dr. Marlou Sa and plan is for surgical Intervention tonight or tomorrow morning -Defer VTE prophylaxis to Orthopedic Surgery when out of Surgery  -Will need Pain Control and Bowel Regimen   Generalized Weakness and Debility with Recurrent Falls -Was Rehabing at [facility] when [place] -Will need PT/OT to evaluate and Treat  Recent Urinary Retention -Foley Catheter had to be placed 11/29/20 for Acute Urinary Retention -Has previously required In and  Out Urethral Catheterization -Suspect due to Dementia -Will need to monitor Post-Operatively and Check Bladder Scan   Permanent/Chronic Atrial Fibrillation -Was previously on Coumadin but Dr. Fritzi Mandes discussed risks and benefits of long-term AC with the patient's Husband -He leaned toward discontinuing the Coumadin given the patient's Hx of Falls and Risk of Falls in the Future and Coumadin was stopped Last admission  -CHA2DS2-VASc was 4-5  -Continue to Monitor on Telemetry  -C/w Diltiazem 60 mg po q12h -Check EKG  Compression Fractures -Had complaints of Low back Pain at [facility] and was found to have a T12 and L1 Compression Fx that were Age Indeterminate  -She was given Analgesics -Neurosurgery at that time recommended TLSO Brace and conservative Management   Dyslipidemia -C/w Atorvastatin 10 mg po QHS  Dementia with Behavioral Disturbances -Unable to provide a Subjective Hx to EDP -Only Alert and Oriented to herself -C/w Quetiapine 25 mg po qHS -Place on Delirium Precautions  Chronic Diastolic CHF -Currently not decompensated; Holding Lasix 20 mg po Daily  for now -Her Benazepril had been held at D/C on 12/02/20 -Given a bolus of LR in the ED -Strict I's and O's and Daily Weights -Continue to Monitor for  S/Sx of Volume Overload  Hyponatremia -Mild. Na+ went from 135 on last admission to 130 -Given a 1 Liter NS bolus -Hold Home Lasix  -Continue to Monitor and Trend -Repeat CMP in the AM   HTN -Hold Amlodipine 5 mg po Daily for now; Her Benazepril was stopped last Hospitalization due to Hypotension -Continue to Monitor BP per Protocol   Hypokalemia -Patient presented with a K+ of 3.2 -Check Mag Level -Replet with IV KCl 30 mEQ in the ED -Continue to Monitor and Replete as Necessary -Repeat CMP in the AM   Hyperbilirubinemia -Patient's T Bili was elevated to 4.1 but no elevation in LFTs -Likley Reactive but will need to continue to Monitor and Trend Hepatic Fxn Panel and if Necessary will obtain a RUQ U/S -Repeat CMP in the AM  Leukocytosis -Patient's WBC went from 11.8 -> 16.3 -Continue to Monitor For S/Sx of Infection; Checking CXR and U/A -Repeat CBC in the AM  Normocytic Anemia -Patient's Hgb/hct went from 9.3/26.9 -> 10.7/31.9 -Check Anemia Panel in the AM -Expect Hgb to Drop Post-Operatively -Continue to Monitor For S/Sx of Bleeding; Currently no Overt bleeding noted -Repeat CBC in the AM    DVT prophylaxis: SCDs Code Status: DO NOT RESUSCITATE  Family Communication: Spoke to Husband at bedside   Disposition Plan: Anticipate D/C to SNF when Medically Stable and Cleared by Ortho Consults called: Orthopedic Surgery Dr. Marlou Sa Admission status: Inpatient Med Surge with Cardiac Monitoring   Severity of Illness: The appropriate patient status for this patient is INPATIENT. Inpatient status is judged to be reasonable and necessary in order to provide the required intensity of service to ensure the patient's safety. The patient's presenting symptoms, physical exam findings, and initial radiographic and laboratory data in the context of their chronic comorbidities is felt to place them at high risk for further clinical deterioration. Furthermore, it is not anticipated that the  patient will be medically stable for discharge from the hospital within 2 midnights of admission. The following factors support the patient status of inpatient.   " The patient's presenting symptoms include Left Hip Pain. " The worrisome physical exam findings include Shortened and externally rotated hip. " The initial radiographic and laboratory data are worrisome because of Hyponatremia and Hip Fx " The chronic co-morbidities  are as above   * I certify that at the point of admission it is my clinical judgment that the patient will require inpatient hospital care spanning beyond 2 midnights from the point of admission due to high intensity of service, high risk for further deterioration and high frequency of surveillance required.Kerney Elbe, D.O. [facility] PAGER is on [place]  If 7PM-7AM, please contact night-coverage [email or web address]  12/06/2020, 4:34 PM

## 2020-12-06 NOTE — Progress Notes (Addendum)
Patient here for hemiarthroplasty for left hip fracture.  She is ready from a surgical standpoint but we are unable to find her husband.  Search the records for immediate family as well and tried to reach them including the daughter Forbes Cellar in [place] with no luck.  As we are not able to reach the husband for consent the surgery will not be performed tonight.  When we do have consent on the chart we will try to reschedule.  Happy to talk to the husband at any time once we can locate him.  We tried for about an hour to reach him on his phone at the number listed with no success.  Continue with n.p.o. after midnight tonight.  Okay to eat at this time.

## 2020-12-06 NOTE — Progress Notes (Signed)
Patient with left hip fracture. Recently discharged from skilled nursing Plan anterior approach left hip hemiarthroplasty Full consult to follow presurgery

## 2020-12-06 NOTE — Anesthesia Preprocedure Evaluation (Deleted)
Anesthesia Evaluation  Patient identified by MRN, date of birth, ID band Patient awake    Reviewed: Allergy & Precautions, NPO status , Patient's Chart, lab work & pertinent test results  Airway Mallampati: III  TM Distance: >3 FB Neck ROM: Full    Dental  (+) Poor Dentition, Dental Advisory Given, Chipped, Missing   Pulmonary neg pulmonary ROS,    Pulmonary exam normal breath sounds clear to auscultation       Cardiovascular hypertension, Pt. on medications pulmonary hypertensionNormal cardiovascular exam+ dysrhythmias Atrial Fibrillation + Valvular Problems/Murmurs MR  Rhythm:Regular Rate:Normal  Echo 04/2018 - Left ventricle: The cavity size was normal. Systolic function was normal. The estimated ejection fraction was in the range of 55% to 60%. Wall motion was normal; there were no regional wall motion abnormalities. The study is not technically sufficient to allow evaluation of LV diastolic function.  - Mitral valve: There was mild regurgitation.  - Left atrium: The atrium was mildly dilated.  - Right ventricle: Systolic function was normal.  - Pulmonary arteries: Systolic pressure was mildly elevated. PA peak pressure: 42 mm Hg (S).    Neuro/Psych negative neurological ROS     GI/Hepatic Neg liver ROS, GERD  ,  Endo/Other  negative endocrine ROS  Renal/GU negative Renal ROS     Musculoskeletal  (+) Arthritis ,   Abdominal   Peds  Hematology negative hematology ROS (+)   Anesthesia Other Findings   Reproductive/Obstetrics                            Anesthesia Physical Anesthesia Plan  ASA: III  Anesthesia Plan: General   Post-op Pain Management:    Induction: Intravenous  PONV Risk Score and Plan: 4 or greater and Ondansetron, Dexamethasone, Treatment may vary due to age or medical condition and Diphenhydramine  Airway Management Planned: Oral ETT  Additional Equipment:  None  Intra-op Plan:   Post-operative Plan: Extubation in OR  Informed Consent: I have reviewed the patients History and Physical, chart, labs and discussed the procedure including the risks, benefits and alternatives for the proposed anesthesia with the patient or authorized representative who has indicated his/her understanding and acceptance.     Dental advisory given  Plan Discussed with: CRNA  Anesthesia Plan Comments: (Pt with significant delirium. POA left hospital prior to surgeon evaluation and consent. Staff unable to reach POA.)      Anesthesia Quick Evaluation

## 2020-12-06 NOTE — Progress Notes (Signed)
Op canceled. Report given to 1440 RN via phone and tx to room

## 2020-12-06 NOTE — ED Triage Notes (Signed)
EMS reports from [facility] called out for Left femur fracture from fall yesterday. X-rays done at facility.Report at bedside. Noted rotation to left leg. Hx of Dementia, A&O X 1 at baseline.  BP 118/67 HR 95 RR 16 Sp02 94 RA

## 2020-12-06 NOTE — Progress Notes (Signed)
D; pt is pre-op, unable to get in touch with family, pt is confused. Awaiting to hold family for consent.

## 2020-12-07 ENCOUNTER — Inpatient Hospital Stay (HOSPITAL_COMMUNITY): Payer: Medicare PPO | Admitting: Anesthesiology

## 2020-12-07 ENCOUNTER — Inpatient Hospital Stay (HOSPITAL_COMMUNITY): Payer: Medicare PPO

## 2020-12-07 ENCOUNTER — Encounter (HOSPITAL_COMMUNITY): Payer: Self-pay | Admitting: Internal Medicine

## 2020-12-07 ENCOUNTER — Encounter (HOSPITAL_COMMUNITY)
Admission: EM | Disposition: A | Discharge status: Skilled Nursing Facility | Payer: Self-pay | Source: Skilled Nursing Facility | Attending: Internal Medicine

## 2020-12-07 DIAGNOSIS — S72042A Displaced fracture of base of neck of left femur, initial encounter for closed fracture: Secondary | ICD-10-CM

## 2020-12-07 DIAGNOSIS — E876 Hypokalemia: Secondary | ICD-10-CM

## 2020-12-07 HISTORY — PX: ANTERIOR APPROACH HEMI HIP ARTHROPLASTY: SHX6690

## 2020-12-07 LAB — CBC
HCT: 27.4 % — ABNORMAL LOW (ref 36.0–46.0)
Hemoglobin: 9 g/dL — ABNORMAL LOW (ref 12.0–15.0)
MCH: 32.7 pg (ref 26.0–34.0)
MCHC: 32.8 g/dL (ref 30.0–36.0)
MCV: 99.6 fL (ref 80.0–100.0)
Platelets: 391 10*3/uL (ref 150–400)
RBC: 2.75 MIL/uL — ABNORMAL LOW (ref 3.87–5.11)
RDW: 15.9 % — ABNORMAL HIGH (ref 11.5–15.5)
WBC: 11.8 10*3/uL — ABNORMAL HIGH (ref 4.0–10.5)
nRBC: 0 % (ref 0.0–0.2)

## 2020-12-07 LAB — URINALYSIS, ROUTINE W REFLEX MICROSCOPIC
Bilirubin Urine: NEGATIVE
Glucose, UA: NEGATIVE mg/dL
Ketones, ur: NEGATIVE mg/dL
Leukocytes,Ua: NEGATIVE
Nitrite: NEGATIVE
Protein, ur: NEGATIVE mg/dL
Specific Gravity, Urine: 1.015 (ref 1.005–1.030)
pH: 5 (ref 5.0–8.0)

## 2020-12-07 LAB — COMPREHENSIVE METABOLIC PANEL
ALT: 20 U/L (ref 0–44)
AST: 17 U/L (ref 15–41)
Albumin: 2.8 g/dL — ABNORMAL LOW (ref 3.5–5.0)
Alkaline Phosphatase: 58 U/L (ref 38–126)
Anion gap: 7 (ref 5–15)
BUN: 19 mg/dL (ref 8–23)
CO2: 31 mmol/L (ref 22–32)
Calcium: 8.3 mg/dL — ABNORMAL LOW (ref 8.9–10.3)
Chloride: 92 mmol/L — ABNORMAL LOW (ref 98–111)
Creatinine, Ser: 0.47 mg/dL (ref 0.44–1.00)
GFR, Estimated: >60 mL/min (ref >60)
Glucose, Bld: 100 mg/dL — ABNORMAL HIGH (ref 70–99)
Potassium: 3.3 mmol/L — ABNORMAL LOW (ref 3.5–5.1)
Sodium: 130 mmol/L — ABNORMAL LOW (ref 135–145)
Total Bilirubin: 2.7 mg/dL — ABNORMAL HIGH (ref 0.3–1.2)
Total Protein: 5 g/dL — ABNORMAL LOW (ref 6.5–8.1)

## 2020-12-07 LAB — PHOSPHORUS: Phosphorus: 3.1 mg/dL (ref 2.5–4.6)

## 2020-12-07 LAB — VITAMIN D 25 HYDROXY (VIT D DEFICIENCY, FRACTURES): Vit D, 25-Hydroxy: 81.27 ng/mL (ref 30–100)

## 2020-12-07 LAB — MAGNESIUM: Magnesium: 1.5 mg/dL — ABNORMAL LOW (ref 1.7–2.4)

## 2020-12-07 SURGERY — HEMIARTHROPLASTY, HIP, DIRECT ANTERIOR APPROACH, FOR FRACTURE
Anesthesia: General | Site: Hip | Laterality: Left

## 2020-12-07 MED ORDER — ESMOLOL HCL 100 MG/10ML IV SOLN
INTRAVENOUS | Status: AC
  Filled 2020-12-07: qty 10

## 2020-12-07 MED ORDER — ACETAMINOPHEN 10 MG/ML IV SOLN
INTRAVENOUS | Status: DC | PRN
  Administered 2020-12-07: 1000 mg via INTRAVENOUS

## 2020-12-07 MED ORDER — SUGAMMADEX SODIUM 200 MG/2ML IV SOLN
INTRAVENOUS | Status: DC | PRN
  Administered 2020-12-07: 200 mg via INTRAVENOUS

## 2020-12-07 MED ORDER — TRANEXAMIC ACID-NACL 1000-0.7 MG/100ML-% IV SOLN
1000.0000 mg | Freq: Once | INTRAVENOUS | Status: AC
Start: 2020-12-08 — End: 2020-12-08
  Administered 2020-12-08: 1000 mg via INTRAVENOUS
  Filled 2020-12-07: qty 100

## 2020-12-07 MED ORDER — METHOCARBAMOL 500 MG PO TABS: 500.0000 mg | ORAL_TABLET | Freq: Four times a day (QID) | ORAL | Status: DC | PRN

## 2020-12-07 MED ORDER — ATORVASTATIN CALCIUM 10 MG PO TABS
10.0000 mg | ORAL_TABLET | Freq: Every day | ORAL | Status: DC
  Administered 2020-12-08 – 2020-12-10 (×3): 10 mg via ORAL
  Filled 2020-12-07 (×3): qty 1

## 2020-12-07 MED ORDER — PHENYLEPHRINE 40 MCG/ML (10ML) SYRINGE FOR IV PUSH (FOR BLOOD PRESSURE SUPPORT)
PREFILLED_SYRINGE | INTRAVENOUS | Status: AC
  Filled 2020-12-07: qty 10

## 2020-12-07 MED ORDER — ONDANSETRON HCL 4 MG/2ML IJ SOLN
INTRAMUSCULAR | Status: AC
  Filled 2020-12-07: qty 2

## 2020-12-07 MED ORDER — METHOCARBAMOL 1000 MG/10ML IJ SOLN
500.0000 mg | Freq: Four times a day (QID) | INTRAVENOUS | Status: DC | PRN
  Filled 2020-12-07: qty 5

## 2020-12-07 MED ORDER — ONDANSETRON HCL 4 MG PO TABS: 4.0000 mg | ORAL_TABLET | Freq: Four times a day (QID) | ORAL | Status: DC | PRN

## 2020-12-07 MED ORDER — DEXAMETHASONE SODIUM PHOSPHATE 10 MG/ML IJ SOLN
INTRAMUSCULAR | Status: DC | PRN
  Administered 2020-12-07: 5 mg via INTRAVENOUS

## 2020-12-07 MED ORDER — POVIDONE-IODINE 10 % EX SWAB: 2.0000 "application " | Freq: Once | CUTANEOUS | Status: DC

## 2020-12-07 MED ORDER — BISACODYL 10 MG RE SUPP: 10.0000 mg | Freq: Every day | RECTAL | Status: DC | PRN

## 2020-12-07 MED ORDER — ENOXAPARIN SODIUM 40 MG/0.4ML IJ SOSY: 40.0000 mg | PREFILLED_SYRINGE | INTRAMUSCULAR | Status: DC

## 2020-12-07 MED ORDER — ACETAMINOPHEN 325 MG PO TABS
325.0000 mg | ORAL_TABLET | Freq: Four times a day (QID) | ORAL | Status: DC | PRN
Start: 2020-12-09 — End: 2020-12-11

## 2020-12-07 MED ORDER — MAGNESIUM SULFATE 50 % IJ SOLN
3.0000 g | Freq: Once | INTRAVENOUS | Status: AC
  Administered 2020-12-07: 3 g via INTRAVENOUS
  Filled 2020-12-07: qty 6

## 2020-12-07 MED ORDER — PHENYLEPHRINE HCL (PRESSORS) 10 MG/ML IV SOLN
INTRAVENOUS | Status: AC
  Filled 2020-12-07: qty 1

## 2020-12-07 MED ORDER — MORPHINE SULFATE (PF) 2 MG/ML IV SOLN: 0.5000 mg | INTRAVENOUS | Status: DC | PRN

## 2020-12-07 MED ORDER — PROPOFOL 10 MG/ML IV BOLUS
INTRAVENOUS | Status: DC | PRN
  Administered 2020-12-07: 50 mg via INTRAVENOUS

## 2020-12-07 MED ORDER — FLEET ENEMA 7-19 GM/118ML RE ENEM: 1.0000 | ENEMA | Freq: Once | RECTAL | Status: DC | PRN

## 2020-12-07 MED ORDER — SODIUM CHLORIDE (PF) 0.9 % IJ SOLN
INTRAMUSCULAR | Status: AC
  Filled 2020-12-07: qty 20

## 2020-12-07 MED ORDER — LIDOCAINE 2% (20 MG/ML) 5 ML SYRINGE
INTRAMUSCULAR | Status: DC | PRN
  Administered 2020-12-07: 40 mg via INTRAVENOUS

## 2020-12-07 MED ORDER — VANCOMYCIN HCL 1000 MG IV SOLR
INTRAVENOUS | Status: AC
  Filled 2020-12-07: qty 1000

## 2020-12-07 MED ORDER — QUETIAPINE FUMARATE 25 MG PO TABS: 25.0000 mg | ORAL_TABLET | Freq: Every day | ORAL | Status: DC

## 2020-12-07 MED ORDER — TRANEXAMIC ACID-NACL 1000-0.7 MG/100ML-% IV SOLN
INTRAVENOUS | Status: AC
  Filled 2020-12-07: qty 100

## 2020-12-07 MED ORDER — DOCUSATE SODIUM 100 MG PO CAPS
100.0000 mg | ORAL_CAPSULE | Freq: Two times a day (BID) | ORAL | Status: DC
  Administered 2020-12-08 – 2020-12-11 (×7): 100 mg via ORAL
  Filled 2020-12-07 (×7): qty 1

## 2020-12-07 MED ORDER — POLYETHYLENE GLYCOL 3350 17 G PO PACK: 17.0000 g | PACK | Freq: Every day | ORAL | Status: DC | PRN

## 2020-12-07 MED ORDER — SENNA 8.6 MG PO TABS
1.0000 | ORAL_TABLET | Freq: Two times a day (BID) | ORAL | Status: DC
  Administered 2020-12-08 – 2020-12-11 (×7): 8.6 mg via ORAL
  Filled 2020-12-07 (×7): qty 1

## 2020-12-07 MED ORDER — AMISULPRIDE (ANTIEMETIC) 5 MG/2ML IV SOLN: 10.0000 mg | Freq: Once | INTRAVENOUS | Status: DC | PRN

## 2020-12-07 MED ORDER — EPHEDRINE 5 MG/ML INJ
INTRAVENOUS | Status: AC
  Filled 2020-12-07: qty 10

## 2020-12-07 MED ORDER — PHENOL 1.4 % MT LIQD: 1.0000 | OROMUCOSAL | Status: DC | PRN

## 2020-12-07 MED ORDER — VANCOMYCIN HCL 1000 MG IV SOLR
INTRAVENOUS | Status: DC | PRN
  Administered 2020-12-07: 1 g via TOPICAL

## 2020-12-07 MED ORDER — VITAMIN D 25 MCG (1000 UNIT) PO TABS
1000.0000 [IU] | ORAL_TABLET | Freq: Every day | ORAL | Status: DC
  Administered 2020-12-08 – 2020-12-11 (×4): 1000 [IU] via ORAL
  Filled 2020-12-07 (×5): qty 1

## 2020-12-07 MED ORDER — TRANEXAMIC ACID-NACL 1000-0.7 MG/100ML-% IV SOLN
1000.0000 mg | INTRAVENOUS | Status: AC
  Administered 2020-12-07: 1000 mg via INTRAVENOUS

## 2020-12-07 MED ORDER — PROPOFOL 10 MG/ML IV BOLUS
INTRAVENOUS | Status: AC
  Filled 2020-12-07: qty 20

## 2020-12-07 MED ORDER — ROCURONIUM BROMIDE 10 MG/ML (PF) SYRINGE
PREFILLED_SYRINGE | INTRAVENOUS | Status: AC
  Filled 2020-12-07: qty 10

## 2020-12-07 MED ORDER — METOCLOPRAMIDE HCL 5 MG/ML IJ SOLN: 5.0000 mg | Freq: Three times a day (TID) | INTRAMUSCULAR | Status: DC | PRN

## 2020-12-07 MED ORDER — FENTANYL CITRATE (PF) 100 MCG/2ML IJ SOLN
INTRAMUSCULAR | Status: AC
  Filled 2020-12-07: qty 2

## 2020-12-07 MED ORDER — LIDOCAINE HCL (PF) 2 % IJ SOLN
INTRAMUSCULAR | Status: AC
  Filled 2020-12-07: qty 5

## 2020-12-07 MED ORDER — SUCCINYLCHOLINE CHLORIDE 200 MG/10ML IV SOSY
PREFILLED_SYRINGE | INTRAVENOUS | Status: AC
  Filled 2020-12-07: qty 10

## 2020-12-07 MED ORDER — SUCCINYLCHOLINE CHLORIDE 200 MG/10ML IV SOSY
PREFILLED_SYRINGE | INTRAVENOUS | Status: DC | PRN
  Administered 2020-12-07: 100 mg via INTRAVENOUS

## 2020-12-07 MED ORDER — DEXAMETHASONE SODIUM PHOSPHATE 10 MG/ML IJ SOLN
INTRAMUSCULAR | Status: AC
  Filled 2020-12-07: qty 1

## 2020-12-07 MED ORDER — CEFAZOLIN SODIUM-DEXTROSE 2-4 GM/100ML-% IV SOLN
INTRAVENOUS | Status: AC
  Filled 2020-12-07: qty 100

## 2020-12-07 MED ORDER — HYDROCODONE-ACETAMINOPHEN 5-325 MG PO TABS: 1.0000 | ORAL_TABLET | Freq: Four times a day (QID) | ORAL | Status: DC | PRN

## 2020-12-07 MED ORDER — FENTANYL CITRATE (PF) 100 MCG/2ML IJ SOLN: 25.0000 ug | INTRAMUSCULAR | Status: DC | PRN

## 2020-12-07 MED ORDER — LACTATED RINGERS IV SOLN: INTRAVENOUS | Status: DC | PRN

## 2020-12-07 MED ORDER — FENTANYL CITRATE (PF) 250 MCG/5ML IJ SOLN
INTRAMUSCULAR | Status: DC | PRN
  Administered 2020-12-07: 25 ug via INTRAVENOUS
  Administered 2020-12-07: 100 ug via INTRAVENOUS
  Administered 2020-12-07: 50 ug via INTRAVENOUS
  Administered 2020-12-07: 25 ug via INTRAVENOUS

## 2020-12-07 MED ORDER — ACETAMINOPHEN 10 MG/ML IV SOLN
INTRAVENOUS | Status: AC
  Filled 2020-12-07: qty 100

## 2020-12-07 MED ORDER — FERROUS SULFATE 325 (65 FE) MG PO TABS
325.0000 mg | ORAL_TABLET | Freq: Three times a day (TID) | ORAL | Status: DC
  Administered 2020-12-08 – 2020-12-11 (×10): 325 mg via ORAL
  Filled 2020-12-07 (×11): qty 1

## 2020-12-07 MED ORDER — SODIUM CHLORIDE 0.9 % IR SOLN
Status: DC | PRN
  Administered 2020-12-07: 2000 mL

## 2020-12-07 MED ORDER — ARTIFICIAL TEARS OPHTHALMIC OINT
TOPICAL_OINTMENT | OPHTHALMIC | Status: AC
  Filled 2020-12-07: qty 3.5

## 2020-12-07 MED ORDER — DILTIAZEM HCL ER 60 MG PO CP12: 60.0000 mg | ORAL_CAPSULE | Freq: Two times a day (BID) | ORAL | Status: DC

## 2020-12-07 MED ORDER — TRANEXAMIC ACID-NACL 1000-0.7 MG/100ML-% IV SOLN: 1000.0000 mg | Freq: Once | INTRAVENOUS | Status: DC

## 2020-12-07 MED ORDER — PHENYLEPHRINE 40 MCG/ML (10ML) SYRINGE FOR IV PUSH (FOR BLOOD PRESSURE SUPPORT)
PREFILLED_SYRINGE | INTRAVENOUS | Status: DC | PRN
  Administered 2020-12-07: 100 ug via INTRAVENOUS

## 2020-12-07 MED ORDER — ADULT MULTIVITAMIN W/MINERALS CH
1.0000 | ORAL_TABLET | Freq: Every day | ORAL | Status: DC
  Administered 2020-12-08 – 2020-12-11 (×4): 1 via ORAL
  Filled 2020-12-07 (×4): qty 1

## 2020-12-07 MED ORDER — LATANOPROST 0.005 % OP SOLN
1.0000 [drp] | Freq: Every day | OPHTHALMIC | Status: DC
  Administered 2020-12-08 – 2020-12-11 (×4): 1 [drp] via OPHTHALMIC
  Filled 2020-12-07: qty 2.5

## 2020-12-07 MED ORDER — ACETAMINOPHEN 500 MG PO TABS: 500.0000 mg | ORAL_TABLET | Freq: Three times a day (TID) | ORAL | Status: DC | PRN

## 2020-12-07 MED ORDER — PROMETHAZINE HCL 25 MG/ML IJ SOLN
6.2500 mg | INTRAMUSCULAR | Status: DC | PRN
Start: 2020-12-07 — End: 2020-12-07

## 2020-12-07 MED ORDER — ACETAMINOPHEN 500 MG PO TABS
500.0000 mg | ORAL_TABLET | Freq: Four times a day (QID) | ORAL | Status: AC
  Administered 2020-12-08 (×3): 500 mg via ORAL
  Filled 2020-12-07 (×4): qty 1

## 2020-12-07 MED ORDER — ALBUMIN HUMAN 5 % IV SOLN: INTRAVENOUS | Status: DC | PRN

## 2020-12-07 MED ORDER — ENSURE SURGERY PO LIQD
237.0000 mL | Freq: Two times a day (BID) | ORAL | Status: DC
  Administered 2020-12-08: 50 mL via ORAL
  Administered 2020-12-10 – 2020-12-11 (×3): 237 mL via ORAL
  Filled 2020-12-07 (×9): qty 237

## 2020-12-07 MED ORDER — CHLORHEXIDINE GLUCONATE 4 % EX LIQD
60.0000 mL | Freq: Once | CUTANEOUS | Status: AC
  Administered 2020-12-07: 4 via TOPICAL
  Filled 2020-12-07: qty 60

## 2020-12-07 MED ORDER — METOCLOPRAMIDE HCL 5 MG PO TABS: 5.0000 mg | ORAL_TABLET | Freq: Three times a day (TID) | ORAL | Status: DC | PRN

## 2020-12-07 MED ORDER — CEFAZOLIN SODIUM-DEXTROSE 2-4 GM/100ML-% IV SOLN
2.0000 g | INTRAVENOUS | Status: AC
  Administered 2020-12-07: 2 g via INTRAVENOUS

## 2020-12-07 MED ORDER — HYDROCODONE-ACETAMINOPHEN 5-325 MG PO TABS: 1.0000 | ORAL_TABLET | ORAL | Status: DC | PRN

## 2020-12-07 MED ORDER — POTASSIUM CHLORIDE 10 MEQ/100ML IV SOLN
10.0000 meq | INTRAVENOUS | Status: AC
  Administered 2020-12-07 (×4): 10 meq via INTRAVENOUS
  Filled 2020-12-07 (×4): qty 100

## 2020-12-07 MED ORDER — TIMOLOL MALEATE 0.5 % OP SOLN
1.0000 [drp] | Freq: Every day | OPHTHALMIC | Status: DC
  Administered 2020-12-08 – 2020-12-11 (×4): 1 [drp] via OPHTHALMIC
  Filled 2020-12-07: qty 5

## 2020-12-07 MED ORDER — ONDANSETRON HCL 4 MG/2ML IJ SOLN
INTRAMUSCULAR | Status: DC | PRN
  Administered 2020-12-07: 4 mg via INTRAVENOUS

## 2020-12-07 MED ORDER — ONDANSETRON HCL 4 MG/2ML IJ SOLN: 4.0000 mg | Freq: Four times a day (QID) | INTRAMUSCULAR | Status: DC | PRN

## 2020-12-07 MED ORDER — CEFAZOLIN SODIUM-DEXTROSE 2-4 GM/100ML-% IV SOLN
2.0000 g | Freq: Three times a day (TID) | INTRAVENOUS | Status: AC
  Administered 2020-12-08 (×2): 2 g via INTRAVENOUS
  Filled 2020-12-07 (×2): qty 100

## 2020-12-07 MED ORDER — ASPIRIN EC 325 MG PO TBEC
325.0000 mg | DELAYED_RELEASE_TABLET | Freq: Every day | ORAL | Status: DC
  Administered 2020-12-08 – 2020-12-11 (×4): 325 mg via ORAL
  Filled 2020-12-07 (×4): qty 1

## 2020-12-07 MED ORDER — PHENYLEPHRINE HCL-NACL 10-0.9 MG/250ML-% IV SOLN
INTRAVENOUS | Status: DC | PRN
  Administered 2020-12-07: 100 ug/min via INTRAVENOUS

## 2020-12-07 MED ORDER — MENTHOL 3 MG MT LOZG: 1.0000 | LOZENGE | OROMUCOSAL | Status: DC | PRN

## 2020-12-07 MED ORDER — ROCURONIUM BROMIDE 10 MG/ML (PF) SYRINGE
PREFILLED_SYRINGE | INTRAVENOUS | Status: DC | PRN
  Administered 2020-12-07: 20 mg via INTRAVENOUS
  Administered 2020-12-07: 30 mg via INTRAVENOUS

## 2020-12-07 MED ORDER — BUPIVACAINE LIPOSOME 1.3 % IJ SUSP
20.0000 mL | Freq: Once | INTRAMUSCULAR | Status: AC
  Administered 2020-12-07: 20 mL
  Filled 2020-12-07: qty 20

## 2020-12-07 MED ORDER — CHLORHEXIDINE GLUCONATE CLOTH 2 % EX PADS: 6.0000 | MEDICATED_PAD | Freq: Every day | CUTANEOUS | Status: DC

## 2020-12-07 MED ORDER — PROMETHAZINE HCL 25 MG/ML IJ SOLN: 6.2500 mg | INTRAMUSCULAR | Status: DC | PRN

## 2020-12-07 SURGICAL SUPPLY — 51 items
BAG DECANTER FOR FLEXI CONT (MISCELLANEOUS) IMPLANT
BAG ZIPLOCK 12X15 (MISCELLANEOUS) ×2 IMPLANT
BALL HIP ARTICU 28 +5 (Hips) ×1 IMPLANT
BENZOIN TINCTURE PRP APPL 2/3 (GAUZE/BANDAGES/DRESSINGS) IMPLANT
BIPOLAR PROS AML 46 (Hips) ×2 IMPLANT
BLADE SAW SGTL 18X1.27X75 (BLADE) ×2 IMPLANT
BLADE SURG SZ10 CARB STEEL (BLADE) ×4 IMPLANT
CELLS DAT CNTRL 66122 CELL SVR (MISCELLANEOUS) ×1 IMPLANT
COVER PERINEAL POST (MISCELLANEOUS) ×2 IMPLANT
COVER SURGICAL LIGHT HANDLE (MISCELLANEOUS) ×2 IMPLANT
COVER WAND RF STERILE (DRAPES) IMPLANT
DRAPE C-ARM 42X120 X-RAY (DRAPES) ×2 IMPLANT
DRAPE FOOT SWITCH (DRAPES) ×2 IMPLANT
DRAPE POUCH INSTRU U-SHP 10X18 (DRAPES) ×2 IMPLANT
DRAPE STERI IOBAN 125X83 (DRAPES) ×2 IMPLANT
DRAPE U-SHAPE 47X51 STRL (DRAPES) ×6 IMPLANT
DRSG AQUACEL AG ADV 3.5X10 (GAUZE/BANDAGES/DRESSINGS) ×2 IMPLANT
DURAPREP 26ML APPLICATOR (WOUND CARE) ×2 IMPLANT
ELECT BLADE TIP CTD 4 INCH (ELECTRODE) ×4 IMPLANT
ELECT REM PT RETURN 15FT ADLT (MISCELLANEOUS) ×2 IMPLANT
FACESHIELD WRAPAROUND (MASK) ×4 IMPLANT
GAUZE PACKING 1/2X5YD (GAUZE/BANDAGES/DRESSINGS) ×4 IMPLANT
GLOVE SRG 8 PF TXTR STRL LF DI (GLOVE) ×2 IMPLANT
GLOVE SURG LTX SZ8 (GLOVE) ×2 IMPLANT
GLOVE SURG UNDER POLY LF SZ8 (GLOVE) ×2
GOWN STRL REUS W/TWL XL LVL3 (GOWN DISPOSABLE) ×4 IMPLANT
HEAD BIPOLAR PROS AML 46 (Hips) ×1 IMPLANT
HIP BALL ARTICU 28 +5 (Hips) ×2 IMPLANT
HOOD PEEL AWAY FLYTE STAYCOOL (MISCELLANEOUS) ×4 IMPLANT
KIT BASIN OR (CUSTOM PROCEDURE TRAY) ×2 IMPLANT
KIT TURNOVER KIT A (KITS) ×2 IMPLANT
MARKER SKIN DUAL TIP RULER LAB (MISCELLANEOUS) ×2 IMPLANT
NEEDLE HYPO 22GX1.5 SAFETY (NEEDLE) ×2 IMPLANT
PENCIL SMOKE EVACUATOR (MISCELLANEOUS) ×2 IMPLANT
RTRCTR WOUND ALEXIS 18CM MED (MISCELLANEOUS) ×2
STAPLER VISISTAT 35W (STAPLE) IMPLANT
STEM FEMORAL SZ 6MM STD ACTIS (Stem) ×2 IMPLANT
STRIP CLOSURE SKIN 1/2X4 (GAUZE/BANDAGES/DRESSINGS) ×2 IMPLANT
SUT ETHIBOND NAB CT1 #1 30IN (SUTURE) ×4 IMPLANT
SUT MNCRL AB 4-0 PS2 18 (SUTURE) IMPLANT
SUT MON AB 3-0 SH 27 (SUTURE) ×1
SUT MON AB 3-0 SH27 (SUTURE) ×1 IMPLANT
SUT VIC AB 0 CT1 36 (SUTURE) ×10 IMPLANT
SUT VIC AB 1 CT1 36 (SUTURE) ×8 IMPLANT
SUT VIC AB 2-0 CT1 27 (SUTURE) ×2
SUT VIC AB 2-0 CT1 TAPERPNT 27 (SUTURE) ×2 IMPLANT
SYR 30ML LL (SYRINGE) ×2 IMPLANT
TOWEL OR 17X26 10 PK STRL BLUE (TOWEL DISPOSABLE) ×2 IMPLANT
TOWEL OR NON WOVEN STRL DISP B (DISPOSABLE) ×2 IMPLANT
TRAY FOLEY MTR SLVR 16FR STAT (SET/KITS/TRAYS/PACK) IMPLANT
TUBE KAMVAC SUCTION (TUBING) ×2 IMPLANT

## 2020-12-07 NOTE — Progress Notes (Signed)
OT Cancellation Note  Patient Details Name: Latasha Anderson MRN: [id number] DOB: [birth year]   Cancelled Treatment:    Reason Eval/Treat Not Completed: Patient not medically ready: Noted that pt was scheduled for LT THA on 12/06/2020 which was cancelled, as Orthopedics unable to successfully contact pt's spouse for consent.  Pt remains NWB of LLE with advanced dementia per chart review.  OT to hold today and will continue to monitor to determine pt readiness for skilled occupational therapy services.   Julien Girt 12/07/2020, 9:51 AM

## 2020-12-07 NOTE — Transfer of Care (Signed)
Immediate Anesthesia Transfer of Care Note  Patient: Latasha Anderson  Procedure(s) Performed: ANTERIOR APPROACH HEMI HIP ARTHROPLASTY (Left Hip)  Patient Location: PACU  Anesthesia Type:General  Level of Consciousness: awake, alert , oriented and patient cooperative  Airway & Oxygen Therapy: Patient Spontanous Breathing and Patient connected to face mask oxygen  Post-op Assessment: Report given to RN and Post -op Vital signs reviewed and stable  Post vital signs: Reviewed and stable  Last Vitals:  Vitals Value Taken Time  BP 116/77 12/07/20 2120  Temp 37.1 C 12/07/20 2120  Pulse 86 12/07/20 2123  Resp 9 12/07/20 2123  SpO2 100 % 12/07/20 2123  Vitals shown include unvalidated device data.  Last Pain:  Vitals:   12/07/20 1249  TempSrc: Oral  PainSc:          Complications: No complications documented.

## 2020-12-07 NOTE — Anesthesia Procedure Notes (Signed)
Procedure Name: Intubation Performed by: Cleda Daub, CRNA Pre-anesthesia Checklist: Patient identified, Emergency Drugs available, Suction available and Patient being monitored Patient Re-evaluated:Patient Re-evaluated prior to induction Oxygen Delivery Method: Circle system utilized Preoxygenation: Pre-oxygenation with 100% oxygen Induction Type: IV induction Ventilation: Mask ventilation without difficulty Laryngoscope Size: Mac and 3 Grade View: Grade I Tube type: Oral Tube size: 7.0 mm Number of attempts: 1 Airway Equipment and Method: Stylet and Oral airway Placement Confirmation: ETT inserted through vocal cords under direct vision,  positive ETCO2 and breath sounds checked- equal and bilateral Secured at: 20 cm Tube secured with: Tape Dental Injury: Teeth and Oropharynx as per pre-operative assessment

## 2020-12-07 NOTE — Plan of Care (Signed)
  Problem: Elimination: Goal: Will not experience complications related to bowel motility Outcome: Progressing   Problem: Pain Managment: Goal: General experience of comfort will improve Outcome: Progressing   Problem: Safety: Goal: Ability to remain free from injury will improve Outcome: Progressing   Problem: Education: Goal: Knowledge of General Education information will improve Description: Including pain rating scale, medication(s)/side effects and non-pharmacologic comfort measures Outcome: Not Progressing   Problem: Coping: Goal: Level of anxiety will decrease Outcome: Not Progressing

## 2020-12-07 NOTE — Brief Op Note (Signed)
   12/07/2020  9:16 PM  PATIENT:  Josie Dixon Kilmartin  87 y.o. female  PRE-OPERATIVE DIAGNOSIS:  Left Hip Fracture  POST-OPERATIVE DIAGNOSIS:  Left Hip Fracture  PROCEDURE:  Procedure(s): ANTERIOR APPROACH HEMI HIP ARTHROPLASTY  SURGEON:  Surgeon(s): Meredith Pel, MD  ASSISTANT: magnant pa  ANESTHESIA:   general  EBL: 150 ml    Total I/O In: 700 [IV Piggyback:700] Out: 300 [Urine:150; Blood:150]  BLOOD ADMINISTERED: none  DRAINS: none   LOCAL MEDICATIONS USED: exparel  SPECIMEN:  No Specimen  COUNTS:  YES  TOURNIQUET:  * No tourniquets in log *  DICTATION: .Other Dictation: Dictation Number [id number]  PLAN OF CARE: Admit to inpatient   PATIENT DISPOSITION:  PACU - hemodynamically stable

## 2020-12-07 NOTE — Progress Notes (Signed)
Pt arrived from PACU to unit placed in room 1440. Pt confused pulling on lines and impulsive. Pt with pain with slightest touch. Pain meds were given as ordered. On call provider made aware to review any missing meds that needed to be ordered, given that patient did not go to OR. Questioned replacement of potassium and the need to be on any form thinners. No orders were given. Will carry orders and discuss care with am shift.

## 2020-12-07 NOTE — Progress Notes (Signed)
PT Cancellation Note  Patient Details Name: Latasha Anderson MRN: [id number] DOB: May 20, 1931   Cancelled Treatment:    Reason Eval/Treat Not Completed: Patient not medically ready (surgery today, see per ortho orders)   [facility] 12/07/2020, 3:04 PM

## 2020-12-07 NOTE — Progress Notes (Signed)
PROGRESS NOTE    Latasha Anderson [facility]  QRF:[id number] DOB: 1931/06/25 DOA: 12/06/2020 PCP: Latasha Anderson., MD   Brief Narrative:  Latasha Anderson is a 87 y.o. female with medical history significant for but not limited too heart failure with preserved ejection fraction, history of breast cancer status post right mastectomy, history of chest pain, dyslipidemia, hypertension, hypokalemia, permanent atrial fibrillation not on anticoagulation anymore,  history of shingles, as well as other comorbidities who was recently hospitalized last week for a fall and she was found to have age-indeterminate T12/L1 compression fractures.  Subsequently at that time she was found to have generalized weakness and her Coumadin was stopped given her recurrent falls.  She is discharged to skilled nursing facility 12/02/2020 and late last night she fell after her husband left the skilled nursing facility.  Currently she is rehabbing at [facility] and fell and x-rays at the facility revealed a femur fracture.  She presented today MS for pain and inability to ambulate.  She has rotation of her left leg and she is only alert and oriented x1 and to herself.  History is limited due to her significant dementia and most of the history is obtained from the patient's husband and ED providers and documentation.  She fell after 730 last night and complained of pain I had being at [facility].  Husband is hard of hearing himself but states that he took her off Coumadin last week given her recurrent falls.  He states that [facility] did not have any bed rails for the patient and thinks that she fell ambulating to the bathroom.  He states that she denied any other complaint and she has a left wrist deformity from prior fracture.  [facility] was asked admit this patient for left hip fracture and orthopedic surgery was consulted and planning on taking the patient for surgical intervention later this evening however this was not done last night  given that consent was not obtained from the patient's husband.  Consent is now obtained and she will be going on to surgical intervention later on this evening.  Assessment & Plan:   Active Problems:   Hip fracture (HCC)  Acute Left Femur Fracture in the setting of Osteoporosis from recent Fall -Had a Fall at [facility] -Xray there noted Femur Fx; Rpeat X-Ray here showed showed "Displaced subcapital fracture of the LEFT femoral neck." -She has shortening and external rotation of the Left Hip -Also had a Wrist Fx on the Left likely previously; X-Ray of the Left Wrist showed "Osseous demineralization with deformity of the distal LEFT radius, with marked volar tilt of distal radial articular surface, likely representing sequela of remote fracture. Degenerative changes at radiocarpal joint and first [facility] joint. No acute abnormalities." -PT-INR was 13.5/1.0 -Check U/A to r/o Infection given falls  and if that still not done yet -Check Vitamin D Level and it was 81.27 and C/w Cholecalciferol 1000 units daily  -Hold Alendronate 70 mg qWeekly  -Give Tranexamic Acid per Protocol -EDP spoke with Orthopedics Dr. Marlou Sa and plan is for surgical Intervention tonight or tomorrow morning -Defer VTE prophylaxis to Orthopedic Surgery when out of Surgery  -Will need Pain Control and Bowel Regimen; she received oxycodone last night became extremely sleepy this morning and was somewhat  Generalized Weakness and Debility with Recurrent Falls -Was Rehabing at [facility] when [place] [facility] -Will need PT/OT to evaluate and Treat  Recent Urinary Retention -Foley Catheter had to be placed 11/29/20 for Acute Urinary Retention -Has  previously required In and Out Urethral Catheterization -Suspect due to Dementia -Will need to monitor Post-Operatively and Check Bladder Scan  -We will asked nurse to do a bladder scan today; she had a urinalysis that was ordered and still not obtained yet  Permanent/Chronic Atrial Fibrillation -Was  previously on Coumadin but Dr. Fritzi Mandes discussed risks and benefits of long-term AC with the patient's Husband -He leaned toward discontinuing the Coumadin given the patient's Hx of Falls and Risk of Falls in the Future and Coumadin was stopped Last admission  -CHA2DS2-VASc was 4-5  -Continue to Monitor on Telemetry  -C/w Diltiazem 60 mg po q12h -Check EKG  Compression Fractures -Had complaints of Low back Pain at [facility] and was found to have a T12 and L1 Compression Fx that were Age Indeterminate  -She was given Analgesics -Neurosurgery at that time recommended TLSO Brace and conservative Management   Dyslipidemia -C/w Atorvastatin 10 mg po QHS  Dementia with Behavioral Disturbances -Unable to provide a Subjective Hx to EDP and was unable to provide a subjective history today given her somnolence -She is slightly drowsy and arousable but wants to go back to sleep -C/w Quetiapine 25 mg po qHS -Place on Delirium Precautions  Chronic Diastolic CHF -Currently not decompensated; Holding Lasix 20 mg po Daily  for now -Her Benazepril had been held at D/C on 12/02/20 -Given a bolus of LR in the ED -Strict I's and O's and Daily Weights -Continue to Monitor for S/Sx of Volume Overload  Hyponatremia -Mild. Na+ went from 135 on last admission to 130 this admission.  Repeat this morning was 130 -Given a 1 Liter NS bolus -Hold Home Lasix  -Continue to Monitor and Trend -Repeat CMP in the AM   HTN -Hold Amlodipine 5 mg po Daily for now; Her Benazepril was stopped last Hospitalization due to Hypotension -Continue to Monitor BP per Protocol   Hypokalemia -Patient presented with a K+ of 3.2 and improved to 3.3 -Check Mag Level and it was 1.5 and will be replete with IV mag sulfate 3 g -Replet with IV KCl 40 mEQ today -Continue to Monitor and Replete as Necessary -Repeat CMP in the AM   Hypomagnesemia -Patient's magnesium level is 1.5 -Replete with IV mag sulfate 3 g -Continue  monitor and replete as necessary -Repeat magnesium level in the a.m.  Hyperbilirubinemia -Patient's T Bili was elevated to 4.1 but no elevation in LFTs and today it is improved to 2.7 -Likley Reactive but will need to continue to Monitor and Trend Hepatic Fxn Panel and if Necessary will obtain a RUQ U/S -Repeat CMP in the AM  Leukocytosis -Patient's WBC went from 11.8 -> 16.3 and is now improved to 11.8 -Continue to Monitor For S/Sx of Infection; Checking CXR and U/A -Repeat CBC in the AM  Normocytic Anemia -Patient's Hgb/hct went from 9.3/26.9 -> 10.7/31.9 and is slightly dropped to 9.0/27.4 -Check Anemia Panel in the AM -Expect Hgb to Drop Post-Operatively -Continue to Monitor For S/Sx of Bleeding; Currently no Overt bleeding noted -Repeat CBC in the AM   DVT prophylaxis: Lovenox to be ordered after her surgical intervention but will defer further VTE prophylaxis to orthopedic surgery Code Status: DO NOT RESUSCITATE Family Communication: Discussed with the patient's husband at bedside Disposition Plan: Pending further evaluation by PT and OT and clearance by orthopedic surgery  Status is: Inpatient  Remains inpatient appropriate because:Unsafe d/c plan, IV treatments appropriate due to intensity of illness or inability to take PO and Inpatient level  of care appropriate due to severity of illness   Dispo: The patient is from: Home              Anticipated d/c is to: Home              Patient currently is not medically stable to d/c.   Difficult to place patient No   Consultants:   Orthopedic Surgery  Procedures: None  Antimicrobials:  Anti-infectives (From admission, onward)   Start     Dose/Rate Route Frequency Ordered Stop   12/06/20 1915  ceFAZolin (ANCEF) IVPB 2g/100 mL premix  Status:  Discontinued        2 g 200 mL/hr over 30 Minutes Intravenous On call to O.R. 12/06/20 1905 12/06/20 2016   12/06/20 1907  ceFAZolin (ANCEF) 2-4 GM/100ML-% IVPB  Status:   Discontinued       Note to Pharmacy: Nicholes Rough   : cabinet override      12/06/20 1907 12/06/20 1952        Subjective: Seen and examined at bedside and she is somnolent and drowsy and asleep.  She is arousable but goes back to sleep.  Nursing states that she received some pain medication last night.  Will need judicious use of narcotics.  No complaints per the husband and understands that she will go for surgical intervention tonight now that he has signed the consent.  Objective: Vitals:   12/06/20 1800 12/06/20 2014 12/07/20 0418 12/07/20 0801  BP: 128/70 125/76 (!) 106/40 (!) 107/52  Pulse: (!) 25 95 75 73  Resp: 16 20 16 18   Temp:  97.7 F (36.5 C) 98.4 F (36.9 C) 98 F (36.7 C)  TempSrc:  Oral Oral Oral  SpO2: (!) 80% 98% 92% 100%    Intake/Output Summary (Last 24 hours) at 12/07/2020 0825 Last data filed at 12/07/2020 0300 Gross per 24 hour  Intake 0 ml  Output --  Net 0 ml   There were no vitals filed for this visit.  Examination: Physical Exam:  Constitutional: Thin chronically ill-appearing Caucasian female who is in no acute distress and is somnolent and drowsy Eyes: Lids and conjunctivae normal, sclerae anicteric  ENMT: External Ears, Nose appear normal. Grossly normal hearing. Neck: Appears normal, supple, no cervical masses, normal ROM, no appreciable thyromegaly; no JVD Respiratory: Diminished to auscultation bilaterally with coarse breath sounds, no wheezing, rales, rhonchi or crackles. Normal respiratory effort and patient is not tachypenic. No accessory muscle use.  Unlabored breathing but is wearing supplemental oxygen via nasal cannula Cardiovascular: RRR, no murmurs / rubs / gallops. S1 and S2 auscultated.  Minimal extremity edema Abdomen: Soft, non-tender, non-distended. Bowel sounds positive.  GU: Deferred. Musculoskeletal: No clubbing / cyanosis of digits/nails.  Left leg is externally rotated and shortened Skin: No rashes, lesions, ulcers on limited  skin evaluation. No induration; Warm and dry.  Neurologic: She is too somnolent and drowsy to follow commands but there is shake her head yes and no Psychiatric: Impaired judgment and insight.  She is not awake and alert or oriented x 3. Normal mood and appropriate affect.   Data Reviewed: I have personally reviewed following labs and imaging studies  CBC: Recent Labs  Lab 12/06/20 1513  WBC 16.3*  NEUTROABS 13.1*  HGB 10.7*  HCT 31.9*  MCV 98.2  PLT 829*   Basic Metabolic Panel: Recent Labs  Lab 12/06/20 1513  NA 130*  K 3.2*  CL 90*  CO2 32  GLUCOSE 122*  BUN 22  CREATININE 0.56  CALCIUM 8.9  MG 1.7  PHOS 2.7   GFR: Estimated Creatinine Clearance: 42.4 mL/min (by C-G formula based on SCr of 0.56 mg/dL). Liver Function Tests: Recent Labs  Lab 12/06/20 1513  AST 24  ALT 27  ALKPHOS 70  BILITOT 4.1*  PROT 5.9*  ALBUMIN 3.3*   No results for input(s): LIPASE, AMYLASE in the last 168 hours. No results for input(s): AMMONIA in the last 168 hours. Coagulation Profile: Recent Labs  Lab 12/01/20 0422 12/06/20 1513  INR 1.7* 1.0   Cardiac Enzymes: No results for input(s): CKTOTAL, CKMB, CKMBINDEX, TROPONINI in the last 168 hours. BNP (last 3 results) No results for input(s): PROBNP in the last 8760 hours. HbA1C: No results for input(s): HGBA1C in the last 72 hours. CBG: No results for input(s): GLUCAP in the last 168 hours. Lipid Profile: No results for input(s): CHOL, HDL, LDLCALC, TRIG, CHOLHDL, LDLDIRECT in the last 72 hours. Thyroid Function Tests: No results for input(s): TSH, T4TOTAL, FREET4, T3FREE, THYROIDAB in the last 72 hours. Anemia Panel: No results for input(s): VITAMINB12, FOLATE, FERRITIN, TIBC, IRON, RETICCTPCT in the last 72 hours. Sepsis Labs: No results for input(s): PROCALCITON, LATICACIDVEN in the last 168 hours.  Recent Results (from the past 240 hour(s))  Resp Panel by RT-PCR (Flu A&B, Covid) Nasopharyngeal Swab     Status: None    Collection Time: 12/02/20 12:24 PM   Specimen: Nasopharyngeal Swab; Nasopharyngeal(NP) swabs in vial transport medium  Result Value Ref Range Status   SARS Coronavirus 2 by RT PCR NEGATIVE NEGATIVE Final    Comment: (NOTE) SARS-CoV-2 target nucleic acids are NOT DETECTED.  The SARS-CoV-2 RNA is generally detectable in upper respiratory specimens during the acute phase of infection. The lowest concentration of SARS-CoV-2 viral copies this assay can detect is 138 copies/mL. A negative result does not preclude SARS-Cov-2 infection and should not be used as the sole basis for treatment or other patient management decisions. A negative result may occur with  improper specimen collection/handling, submission of specimen other than nasopharyngeal swab, presence of viral mutation(s) within the areas targeted by this assay, and inadequate number of viral copies(<138 copies/mL). A negative result must be combined with clinical observations, patient history, and epidemiological information. The expected result is Negative.  Fact Sheet for Patients:  EntrepreneurPulse.com.au  Fact Sheet for Healthcare Providers:  IncredibleEmployment.be  This test is no t yet approved or cleared by the [place] FDA and  has been authorized for detection and/or diagnosis of SARS-CoV-2 by FDA under an Emergency Use Authorization (EUA). This EUA will remain  in effect (meaning this test can be used) for the duration of the COVID-19 declaration under Section 564(b)(1) of the Act, 21 U.S.C.section 360bbb-3(b)(1), unless the authorization is terminated  or revoked sooner.       Influenza A by PCR NEGATIVE NEGATIVE Final   Influenza B by PCR NEGATIVE NEGATIVE Final    Comment: (NOTE) The Xpert Xpress SARS-CoV-2/FLU/RSV plus assay is intended as an aid in the diagnosis of influenza from Nasopharyngeal swab specimens and should not be used as a sole basis for treatment.  Nasal washings and aspirates are unacceptable for Xpert Xpress SARS-CoV-2/FLU/RSV testing.  Fact Sheet for Patients: EntrepreneurPulse.com.au  Fact Sheet for Healthcare Providers: IncredibleEmployment.be  This test is not yet approved or cleared by the [place] FDA and has been authorized for detection and/or diagnosis of SARS-CoV-2 by FDA under an Emergency Use Authorization (EUA). This EUA will remain in effect (meaning this  test can be used) for the duration of the COVID-19 declaration under Section 564(b)(1) of the Act, 21 U.S.C. section 360bbb-3(b)(1), unless the authorization is terminated or revoked.  Performed at [facility], [address] [place], [place] [address]   Resp Panel by RT-PCR (Flu A&B, Covid) Nasopharyngeal Swab     Status: None   Collection Time: 12/06/20  3:15 PM   Specimen: Nasopharyngeal Swab; Nasopharyngeal(NP) swabs in vial transport medium  Result Value Ref Range Status   SARS Coronavirus 2 by RT PCR NEGATIVE NEGATIVE Final    Comment: (NOTE) SARS-CoV-2 target nucleic acids are NOT DETECTED.  The SARS-CoV-2 RNA is generally detectable in upper respiratory specimens during the acute phase of infection. The lowest concentration of SARS-CoV-2 viral copies this assay can detect is 138 copies/mL. A negative result does not preclude SARS-Cov-2 infection and should not be used as the sole basis for treatment or other patient management decisions. A negative result may occur with  improper specimen collection/handling, submission of specimen other than nasopharyngeal swab, presence of viral mutation(s) within the areas targeted by this assay, and inadequate number of viral copies(<138 copies/mL). A negative result must be combined with clinical observations, patient history, and epidemiological information. The expected result is Negative.  Fact Sheet for Patients:   EntrepreneurPulse.com.au  Fact Sheet for Healthcare Providers:  IncredibleEmployment.be  This test is no t yet approved or cleared by the [place] FDA and  has been authorized for detection and/or diagnosis of SARS-CoV-2 by FDA under an Emergency Use Authorization (EUA). This EUA will remain  in effect (meaning this test can be used) for the duration of the COVID-19 declaration under Section 564(b)(1) of the Act, 21 U.S.C.section 360bbb-3(b)(1), unless the authorization is terminated  or revoked sooner.       Influenza A by PCR NEGATIVE NEGATIVE Final   Influenza B by PCR NEGATIVE NEGATIVE Final    Comment: (NOTE) The Xpert Xpress SARS-CoV-2/FLU/RSV plus assay is intended as an aid in the diagnosis of influenza from Nasopharyngeal swab specimens and should not be used as a sole basis for treatment. Nasal washings and aspirates are unacceptable for Xpert Xpress SARS-CoV-2/FLU/RSV testing.  Fact Sheet for Patients: EntrepreneurPulse.com.au  Fact Sheet for Healthcare Providers: IncredibleEmployment.be  This test is not yet approved or cleared by the [place] FDA and has been authorized for detection and/or diagnosis of SARS-CoV-2 by FDA under an Emergency Use Authorization (EUA). This EUA will remain in effect (meaning this test can be used) for the duration of the COVID-19 declaration under Section 564(b)(1) of the Act, 21 U.S.C. section 360bbb-3(b)(1), unless the authorization is terminated or revoked.  Performed at [facility], [employer] [address] [place], [place] [address]     RN Pressure Injury Documentation:     Estimated body mass index is 19.69 kg/m as calculated from the following:   Height as of 11/26/20: 5\' 6"  (1.676 m).   Weight as of 11/26/20: 55.3 kg.  Malnutrition Type:   Malnutrition Characteristics:   Nutrition Interventions:    Radiology Studies: DG  Wrist Complete Left  Result Date: 12/06/2020 CLINICAL DATA:  [place] [address] yesterday EXAM: LEFT WRIST - COMPLETE 3+ VIEW COMPARISON:  None FINDINGS: Osseous demineralization. Degenerative changes at radiocarpal joint and at first [facility] joint. Marked volar tilt of distal radial articular surface and deformity of distal radial metaphysis likely sequela of remote fracture. No acute fracture or dislocation identified. IMPRESSION: Osseous demineralization with deformity of the distal LEFT radius, with marked volar  tilt of distal radial articular surface, likely representing sequela of remote fracture. Degenerative changes at radiocarpal joint and first [facility] joint. No acute abnormalities. Electronically Signed   By: Lavonia Dana M.D.   On: 12/06/2020 15:56   CT Head Wo Contrast  Result Date: 12/06/2020 CLINICAL DATA:  [place] [address] yesterday, LEFT femur fracture, head trauma, history dementia EXAM: CT HEAD WITHOUT CONTRAST TECHNIQUE: Contiguous axial images were obtained from the base of the skull through the vertex without intravenous contrast. COMPARISON:  None FINDINGS: Brain: Generalized atrophy. Normal ventricular morphology. No midline shift or mass effect. Small vessel chronic ischemic changes of deep cerebral white matter. Otherwise normal appearance of brain parenchyma. No intracranial hemorrhage, mass lesion, or evidence of acute infarction. No extra-axial fluid collections. Vascular: No hyperdense vessels. Atherosclerotic calcification of RIGHT vertebral artery at skull base Skull: Diffuse osseous demineralization.  Intact. Sinuses/Orbits: Clear Other: N/A IMPRESSION: Atrophy with small vessel chronic ischemic changes of deep cerebral white matter. No acute intracranial abnormalities. Electronically Signed   By: Lavonia Dana M.D.   On: 12/06/2020 15:52   Chest Portable 1 View  Result Date: 12/07/2020 CLINICAL DATA:  Fall EXAM: PORTABLE CHEST 1 VIEW COMPARISON:  11/27/2020 FINDINGS: Cardiomegaly. No acute airspace opacity or  effusion. No acute bony abnormality. IMPRESSION: Cardiomegaly.  No active disease. Electronically Signed   By: Rolm Baptise M.D.   On: 12/07/2020 08:17   DG HIP UNILAT WITH PELVIS 2-3 VIEWS LEFT  Result Date: 12/06/2020 CLINICAL DATA:  LEFT femur fracture, fell yesterday EXAM: DG HIP (WITH OR WITHOUT PELVIS) 2-3V LEFT COMPARISON:  None FINDINGS: Osseous demineralization. Hip and SI joint spaces preserved. Displaced subcapital fracture of the LEFT femur. No dislocation. Pelvis intact. Degenerative disc and facet disease changes at visualized lumbar spine. IMPRESSION: Displaced subcapital fracture of the LEFT femoral neck. Electronically Signed   By: Lavonia Dana M.D.   On: 12/06/2020 15:54   Scheduled Meds: . sodium chloride   Intravenous Once  . atorvastatin  10 mg Oral QHS  . chlorhexidine  60 mL Topical Once  . cholecalciferol  1,000 Units Oral Q1200  . diltiazem  60 mg Oral Q12H  . enoxaparin (LOVENOX) injection  40 mg Subcutaneous Q24H  . ferrous sulfate  325 mg Oral TID PC  . latanoprost  1 drop Both Eyes Q1200  . QUEtiapine  25 mg Oral QHS  . senna  1 tablet Oral BID  . timolol  1 drop Both Eyes Q1200   Continuous Infusions: . methocarbamol (ROBAXIN) IV      LOS: 1 day   Kerney Elbe, DO [facility] PAGER is on AMION  If 7PM-7AM, please contact night-coverage [email or web address]

## 2020-12-07 NOTE — Anesthesia Preprocedure Evaluation (Addendum)
Anesthesia Evaluation  Patient identified by MRN, date of birth, ID band Patient unresponsive    Reviewed: Allergy & Precautions, NPO status , Patient's Chart, lab work & pertinent test results  History of Anesthesia Complications Negative for: history of anesthetic complications  Airway Mallampati: II  TM Distance: >3 FB     Dental  (+) Dental Advisory Given, Partial Lower, Partial Upper, Poor Dentition   Pulmonary neg pulmonary ROS,    Pulmonary exam normal        Cardiovascular hypertension, Pt. on medications pulmonary hypertension+ dysrhythmias Atrial Fibrillation + Valvular Problems/Murmurs MR  Rhythm:Irregular Rate:Tachycardia  Echo 04/2018 - Left ventricle: The cavity size was normal. Systolic function was normal. The estimated ejection fraction was in the range of 55% to 60%. Wall motion was normal; there were no regional wall motion abnormalities. The study is not technically sufficient to allow evaluation of LV diastolic function.  - Mitral valve: There was mild regurgitation.  - Left atrium: The atrium was mildly dilated.  - Right ventricle: Systolic function was normal.  - Pulmonary arteries: Systolic pressure was mildly elevated. PA peak pressure: 42 mm Hg (S).    Neuro/Psych negative neurological ROS     GI/Hepatic Neg liver ROS, GERD  ,  Endo/Other  negative endocrine ROS  Renal/GU negative Renal ROS     Musculoskeletal  (+) Arthritis ,   Abdominal   Peds  Hematology negative hematology ROS (+)   Anesthesia Other Findings   Reproductive/Obstetrics                          Anesthesia Physical  Anesthesia Plan  ASA: IV  Anesthesia Plan: General   Post-op Pain Management:    Induction: Intravenous  PONV Risk Score and Plan: 4 or greater and Ondansetron, Dexamethasone, Treatment may vary due to age or medical condition and Diphenhydramine  Airway Management  Planned: Oral ETT  Additional Equipment: None  Intra-op Plan:   Post-operative Plan: Possible Post-op intubation/ventilation  Informed Consent: I have reviewed the patients History and Physical, chart, labs and discussed the procedure including the risks, benefits and alternatives for the proposed anesthesia with the patient or authorized representative who has indicated his/her understanding and acceptance.   Patient has DNR.  Discussed DNR with power of attorney and Suspend DNR.   Dental advisory given and Interpreter used for interveiw  Plan Discussed with: CRNA, Anesthesiologist and Surgeon  Anesthesia Plan Comments: (Discussed pt with husband and daughter.)     Anesthesia Quick Evaluation

## 2020-12-07 NOTE — Consult Note (Signed)
ORTHOPAEDIC CONSULTATION  REQUESTING PHYSICIAN: Kerney Elbe, DO  Chief Complaint: "Left hip pain"  HPI: Latasha Anderson is a 87 y.o. female who presents with left hip pain following fall.  Patient has history of advanced dementia so history is limited.  She was discharged to a skilled nursing facility on 6 1 after recent hospitalization with T12 and L1 compression fractures.  She fell while staying at [facility] and was taken to [facility] emergency department where she had radiographs that revealed displaced subcapital fracture of the left femur.  She does have history of taking Coumadin but she has not taken this for several months.  Lives with her husband.  Past Medical History:  Diagnosis Date  . (HFpEF) heart failure with preserved ejection fraction ([place])    a. EF 55-65%, January, 2012, question diastolic dysfunction; b. 71/2458 Echo: EF 55-60%, no rwma. Mild MR. Mildly dil LA. Nl RV fxn. PASP 4mmHg.  . Breast cancer ([place]) 1985   RT MASTECTOMY  . Chest pain   . Dyslipidemia   . Hypertension   . Hypokalemia    February, 2014, potassium started  . Mitral regurgitation    Mild, echo, 2012  . Permanent atrial fibrillation ([place])    a. Dx 08/2012. CHA2DSD2VASc = 4-5-->warfarin.  . Shingles   . Shortness of breath    January, 0998, normal systolic function, question diastolic dysfunction   Past Surgical History:  Procedure Laterality Date  . ABDOMINAL HYSTERECTOMY     age 69  . AUGMENTATION MAMMAPLASTY Right 1985   RT MASTECTOMY FOR BREAST CA  . EYE SURGERY     cataract  . MASTECTOMY Right 1985   reconstruction inplant as well   Social History   Socioeconomic History  . Marital status: Married    Spouse name: Not on file  . Number of children: 1  . Years of education: Not on file  . Highest education level: Bachelor's degree (e.g., BA, AB, BS)  Occupational History  . Not on file  Tobacco Use  . Smoking status: Never Smoker  . Smokeless tobacco: Never  Used  Vaping Use  . Vaping Use: Never used  Substance and Sexual Activity  . Alcohol use: No    Alcohol/week: 0.0 standard drinks  . Drug use: No  . Sexual activity: Never  Other Topics Concern  . Not on file  Social History Narrative  . Not on file   Social Determinants of Health   Financial Resource Strain: Not on file  Food Insecurity: Not on file  Transportation Needs: Not on file  Physical Activity: Not on file  Stress: Not on file  Social Connections: Not on file   Family History  Problem Relation Age of Onset  . Stroke Mother   . Diabetes Father   . Cancer Brother        prostate  . Breast cancer Neg Hx    - negative except otherwise stated in the family history section Allergies  Allergen Reactions  . Ciprofloxacin Nausea And Vomiting  . Cortisone Other (See Comments)    Pt stated this makes her turn blue  . Nitrofurantoin Monohyd Macro Other (See Comments)    GI upset & chills.  . Other Other (See Comments)    Mycins - unknown reaction   Prior to Admission medications   Medication Sig Start Date End Date Taking? Authorizing Provider  acetaminophen (TYLENOL) 500 MG tablet Take 500 mg by mouth every 8 (eight) hours as needed (pain).  Yes [provider]  alendronate (FOSAMAX) 70 MG tablet TAKE 1 TABLET (70 MG TOTAL) BY MOUTH EVERY 7 (SEVEN) DAYS. TAKE WITH A FULL GLASS OF WATER ON AN EMPTY STOMACH. Patient taking differently: Take 70 mg by mouth every Wednesday. Take with a full glass of water on an empty stomach. 10/22/20  Yes Jerrol Banana., MD  atorvastatin (LIPITOR) 10 MG tablet TAKE 1 TABLET BY MOUTH EVERY DAY Patient taking differently: Take 10 mg by mouth at bedtime. 10/12/20  Yes Jerrol Banana., MD  cholecalciferol (VITAMIN D3) 25 MCG (1000 UNIT) tablet Take 1,000 Units by mouth daily at 12 noon.   Yes [provider]  diltiazem (CARDIZEM SR) 60 MG 12 hr capsule Take 60 mg by mouth every 12 (twelve) hours. Hold if BP  <110/70   Yes [provider]  furosemide (LASIX) 20 MG tablet TAKE 1 TABLET BY MOUTH EVERY DAY Patient taking differently: Take 20 mg by mouth daily at 12 noon. 08/20/20  Yes Wellington Hampshire, MD  latanoprost (XALATAN) 0.005 % ophthalmic solution Place 1 drop into both eyes daily at 12 noon. 08/01/12  Yes [provider]  QUEtiapine (SEROQUEL) 25 MG tablet Take 1 tablet (25 mg total) by mouth at bedtime. 12/02/20  Yes Fritzi Mandes, MD  timolol (TIMOPTIC) 0.5 % ophthalmic solution Place 1 drop into both eyes daily at 12 noon. 08/05/12  Yes [provider]  amLODipine (NORVASC) 5 MG tablet Take 1 tablet (5 mg total) by mouth daily. Patient not taking: No sig reported 12/02/20   Fritzi Mandes, MD   DG Wrist Complete Left  Result Date: 12/06/2020 CLINICAL DATA:  [place] yesterday EXAM: LEFT WRIST - COMPLETE 3+ VIEW COMPARISON:  None FINDINGS: Osseous demineralization. Degenerative changes at radiocarpal joint and at first [facility] joint. Marked volar tilt of distal radial articular surface and deformity of distal radial metaphysis likely sequela of remote fracture. No acute fracture or dislocation identified. IMPRESSION: Osseous demineralization with deformity of the distal LEFT radius, with marked volar tilt of distal radial articular surface, likely representing sequela of remote fracture. Degenerative changes at radiocarpal joint and first [facility] joint. No acute abnormalities. Electronically Signed   By: Lavonia Dana M.D.   On: 12/06/2020 15:56   CT Head Wo Contrast  Result Date: 12/06/2020 CLINICAL DATA:  [place] yesterday, LEFT femur fracture, head trauma, history dementia EXAM: CT HEAD WITHOUT CONTRAST TECHNIQUE: Contiguous axial images were obtained from the base of the skull through the vertex without intravenous contrast. COMPARISON:  None FINDINGS: Brain: Generalized atrophy. Normal ventricular morphology. No midline shift or mass effect. Small vessel chronic ischemic changes of deep cerebral  white matter. Otherwise normal appearance of brain parenchyma. No intracranial hemorrhage, mass lesion, or evidence of acute infarction. No extra-axial fluid collections. Vascular: No hyperdense vessels. Atherosclerotic calcification of RIGHT vertebral artery at skull base Skull: Diffuse osseous demineralization.  Intact. Sinuses/Orbits: Clear Other: N/A IMPRESSION: Atrophy with small vessel chronic ischemic changes of deep cerebral white matter. No acute intracranial abnormalities. Electronically Signed   By: Lavonia Dana M.D.   On: 12/06/2020 15:52   Chest Portable 1 View  Result Date: 12/07/2020 CLINICAL DATA:  Fall EXAM: PORTABLE CHEST 1 VIEW COMPARISON:  11/27/2020 FINDINGS: Cardiomegaly. No acute airspace opacity or effusion. No acute bony abnormality. IMPRESSION: Cardiomegaly.  No active disease. Electronically Signed   By: Rolm Baptise M.D.   On: 12/07/2020 08:17   DG HIP UNILAT WITH PELVIS 2-3 VIEWS LEFT  Result Date:  12/06/2020 CLINICAL DATA:  LEFT femur fracture, fell yesterday EXAM: DG HIP (WITH OR WITHOUT PELVIS) 2-3V LEFT COMPARISON:  None FINDINGS: Osseous demineralization. Hip and SI joint spaces preserved. Displaced subcapital fracture of the LEFT femur. No dislocation. Pelvis intact. Degenerative disc and facet disease changes at visualized lumbar spine. IMPRESSION: Displaced subcapital fracture of the LEFT femoral neck. Electronically Signed   By: Lavonia Dana M.D.   On: 12/06/2020 15:54   - pertinent xrays, CT, MRI studies were reviewed and independently interpreted  Positive ROS: All other systems have been reviewed and were otherwise negative with the exception of those mentioned in the HPI and as above.  Physical Exam: General: Alert, no acute distress Psychiatric: Patient is competent for consent with normal mood and affect Lymphatic: No axillary or cervical lymphadenopathy Cardiovascular: No pedal edema Respiratory: No cyanosis, no use of accessory musculature GI: No  organomegaly, abdomen is soft and non-tender    Images:  @ENCIMAGES @  Labs:  Lab Results  Component Value Date   HGBA1C 5.6 06/06/2016   HGBA1C 5.9 (H) 02/12/2015    Lab Results  Component Value Date   ALBUMIN 2.8 (L) 12/07/2020   ALBUMIN 3.3 (L) 12/06/2020   ALBUMIN 3.8 11/26/2020    Neurologic: Patient does not have protective sensation bilateral lower extremities.   MUSCULOSKELETAL:   Ortho exam demonstrates left hip with increased pain with logroll of the hip.  No such pain on the right side.  Able to actively dorsiflex and plantarflex the bilateral ankles.  No knee effusion bilaterally.  No tenderness throughout the bilateral calves or tibial shaft.  No crepitus noted or any swelling noted around the bilateral ankles.  No pain with passive motion of the bilateral upper extremities though she does have deformity of the left wrist that is chronic from previous injury.  No crepitus or swelling noted throughout the bilateral upper extremities.  Assessment: Left subcapital femur fracture  Plan: Plan for left hip hemiarthroplasty by Dr. Marlou Sa on 12/07/2020.  Surgery was planned for 12/06/2020 but no one was available to provide consent for patient as she cannot consent herself due to history of advanced dementia.  Risk and benefits were discussed with patient's husband and daughter who are here with her today.  Plan for weightbearing as tolerated with walker after procedure with likely discharge to skilled nursing facility after she is medically stable.  Thank you for the consult and the opportunity to see Ms. [place], [place] [phone] 6:07 PM

## 2020-12-07 NOTE — Progress Notes (Signed)
Initial Nutrition Assessment  DOCUMENTATION CODES:   Not applicable  INTERVENTION:  - will order Ensure Surgery BID, each supplement provides 330 kcal and 18 grams protein.  - will order 1 tablet multivitamin with minerals/day.    NUTRITION DIAGNOSIS:   Increased nutrient needs related to post-op healing as evidenced by estimated needs.  GOAL:   Patient will meet greater than or equal to 90% of their needs  MONITOR:   Diet advancement,PO intake,Supplement acceptance,Labs,Weight trends  REASON FOR ASSESSMENT:   Consult Hip fracture protocol  ASSESSMENT:   87 y.o. female with medical history of CHF, hx of breast cancer s/p R mastectomy, dyslipidemia, HTN, persistent hypokalemia, permanent afib, dementia, and hx of shingles. She presented to the ED from SNF after a fall resulting in femur fx. Pending consent from patient's husband in order to move forward with surgery.  She has been NPO since admission. She is noted to be a/o to self only. She was sleeping at the time of visit and did not awake, even during NFPE. No family or visitors present.   She has not been seen by a [place] RD at any time in the past.   Weight today is 123 lb. Weight on 11/26/20 was 122 lb, weight on 08/27/20 was 122 lb, and weight on 12/05/19 was 136 lb. This indicates 13 lb weight loss (9.5% body weight) in the past 1 year; not significant for time frame.    Labs reviewed; Na: 130 mmol/l, K: 3.3 mmol/l, Cl: 92 mmol/l, Ca: 8.3 mg/dl, Mg: 1.5 mg/dl. Medications reviewed; 1000 units vitamin D/day, 325 mg ferrous sulfate TID, 3 g IV Mg sulfate x1 run 6/6, 10 mEq IV KCl x4 on 6/6.    NUTRITION - FOCUSED PHYSICAL EXAM:  completed; no muscle or fat depletions, mild edema to BLE.   Diet Order:   Diet Order            Diet NPO time specified  Diet effective now                 EDUCATION NEEDS:   Not appropriate for education at this time  Skin:  Skin Assessment: Reviewed RN Assessment  Last  BM:  PTA/unknown  Height:   Ht Readings from Last 1 Encounters:  11/26/20 5\' 6"  (1.676 m)    Weight:   Wt Readings from Last 1 Encounters:  12/07/20 55.8 kg     Estimated Nutritional Needs:  Kcal:  1500-1700 kcal Protein:  75-85 grams Fluid:  >/= 1.7 L/day      Jarome Matin, MS, RD, LDN, CNSC Inpatient Clinical Dietitian RD pager # available in [facility]  After hours/weekend pager # available in [facility]

## 2020-12-07 NOTE — Plan of Care (Signed)
  Problem: Education: Goal: Knowledge of General Education information will improve Description: Including pain rating scale, medication(s)/side effects and non-pharmacologic comfort measures Outcome: Progressing   Problem: Clinical Measurements: Goal: Will remain free from infection Outcome: Progressing   

## 2020-12-08 ENCOUNTER — Encounter (HOSPITAL_COMMUNITY): Payer: Self-pay | Admitting: Orthopedic Surgery

## 2020-12-08 LAB — IRON AND TIBC
Iron: 30 ug/dL (ref 28–170)
Saturation Ratios: 15 % (ref 10.4–31.8)
TIBC: 202 ug/dL — ABNORMAL LOW (ref 250–450)
UIBC: 172 ug/dL

## 2020-12-08 LAB — CBC
HCT: 24.3 % — ABNORMAL LOW (ref 36.0–46.0)
Hemoglobin: 7.8 g/dL — ABNORMAL LOW (ref 12.0–15.0)
MCH: 32.2 pg (ref 26.0–34.0)
MCHC: 32.1 g/dL (ref 30.0–36.0)
MCV: 100.4 fL — ABNORMAL HIGH (ref 80.0–100.0)
Platelets: 346 10*3/uL (ref 150–400)
RBC: 2.42 MIL/uL — ABNORMAL LOW (ref 3.87–5.11)
RDW: 15.9 % — ABNORMAL HIGH (ref 11.5–15.5)
WBC: 11.9 10*3/uL — ABNORMAL HIGH (ref 4.0–10.5)
nRBC: 0 % (ref 0.0–0.2)

## 2020-12-08 LAB — COMPREHENSIVE METABOLIC PANEL
ALT: 16 U/L (ref 0–44)
AST: 19 U/L (ref 15–41)
Albumin: 3.2 g/dL — ABNORMAL LOW (ref 3.5–5.0)
Alkaline Phosphatase: 51 U/L (ref 38–126)
Anion gap: 8 (ref 5–15)
BUN: 16 mg/dL (ref 8–23)
CO2: 28 mmol/L (ref 22–32)
Calcium: 8.3 mg/dL — ABNORMAL LOW (ref 8.9–10.3)
Chloride: 95 mmol/L — ABNORMAL LOW (ref 98–111)
Creatinine, Ser: 0.53 mg/dL (ref 0.44–1.00)
GFR, Estimated: >60 mL/min (ref >60)
Glucose, Bld: 128 mg/dL — ABNORMAL HIGH (ref 70–99)
Potassium: 4.5 mmol/L (ref 3.5–5.1)
Sodium: 131 mmol/L — ABNORMAL LOW (ref 135–145)
Total Bilirubin: 2.2 mg/dL — ABNORMAL HIGH (ref 0.3–1.2)
Total Protein: 5.4 g/dL — ABNORMAL LOW (ref 6.5–8.1)

## 2020-12-08 LAB — RETICULOCYTES
Immature Retic Fract: 30.9 % — ABNORMAL HIGH (ref 2.3–15.9)
RBC.: 2.51 MIL/uL — ABNORMAL LOW (ref 3.87–5.11)
Retic Count, Absolute: 249.7 10*3/uL — ABNORMAL HIGH (ref 19.0–186.0)
Retic Ct Pct: 10 % — ABNORMAL HIGH (ref 0.4–3.1)

## 2020-12-08 LAB — FOLATE: Folate: 14.2 ng/mL (ref >5.9)

## 2020-12-08 LAB — MAGNESIUM: Magnesium: 1.9 mg/dL (ref 1.7–2.4)

## 2020-12-08 LAB — FERRITIN: Ferritin: 243 ng/mL (ref 11–307)

## 2020-12-08 LAB — VITAMIN B12: Vitamin B-12: 774 pg/mL (ref 180–914)

## 2020-12-08 LAB — PHOSPHORUS: Phosphorus: 3.4 mg/dL (ref 2.5–4.6)

## 2020-12-08 NOTE — Plan of Care (Signed)
  Problem: Education: Goal: Knowledge of General Education information will improve Description: Including pain rating scale, medication(s)/side effects and non-pharmacologic comfort measures Outcome: Progressing   Problem: Clinical Measurements: Goal: Will remain free from infection Outcome: Progressing Goal: Diagnostic test results will improve Outcome: Progressing   Problem: Activity: Goal: Risk for activity intolerance will decrease Outcome: Progressing   Problem: Pain Managment: Goal: General experience of comfort will improve Outcome: Progressing   Problem: Safety: Goal: Ability to remain free from injury will improve Outcome: Progressing

## 2020-12-08 NOTE — Progress Notes (Signed)
PROGRESS NOTE    Latasha Anderson  QMV:[id number] DOB: Mar 29, 1931 DOA: 12/06/2020 PCP: Jerrol Banana., MD   Brief Narrative:  Latasha Anderson is a 87 y.o. female with medical history significant for but not limited too heart failure with preserved ejection fraction, history of breast cancer status post right mastectomy, history of chest pain, dyslipidemia, hypertension, hypokalemia, permanent atrial fibrillation not on anticoagulation anymore,  history of shingles, as well as other comorbidities who was recently hospitalized last week for a fall and she was found to have age-indeterminate T12/L1 compression fractures.  Subsequently at that time she was found to have generalized weakness and her Coumadin was stopped given her recurrent falls.  She is discharged to skilled nursing facility 12/02/2020 and late last night she fell after her husband left the skilled nursing facility.  Currently she is rehabbing at [facility] and fell and x-rays at the facility revealed a femur fracture.  She presented today MS for pain and inability to ambulate.  She has rotation of her left leg and she is only alert and oriented x1 and to herself.  History is limited due to her significant dementia and most of the history is obtained from the patient's husband and ED providers and documentation.  She fell after 730 last night and complained of pain I had being at [facility].  Husband is hard of hearing himself but states that he took her off Coumadin last week given her recurrent falls.  He states that [employer] did not have any bed rails for the patient and thinks that she fell ambulating to the bathroom.  He states that she denied any other complaint and she has a left wrist deformity from prior fracture.  [facility] was asked admit this patient for left hip fracture and orthopedic surgery was consulted and planning on taking the patient for surgical intervention later this evening however this was not done last night  given that consent was not obtained from the patient's husband.  Consent is now obtained and she underwent Surgical Intervention on 12/07/20. She is POD0 and now PT/OT Recommending SNF. Hgb/Hct dropped slightly will need to monitor for S/Sx of bleeding.   Assessment & Plan:   Active Problems:   Hip fracture (HCC)  Acute Left Femur Fracture in the setting of Osteoporosis from recent Fall s/p Left Hip Hemiarthroplasty POD 0 -Had a Fall at [facility] -Xray there noted Femur Fx; Rpeat X-Ray here showed showed "Displaced subcapital fracture of the LEFT femoral neck." -She has shortening and external rotation of the Left Hip -Also had a Wrist Fx on the Left likely previously; X-Ray of the Left Wrist showed "Osseous demineralization with deformity of the distal LEFT radius, with marked volar tilt of distal radial articular surface, likely representing sequela of remote fracture. Degenerative changes at radiocarpal joint and first [facility] joint. No acute abnormalities." -PT-INR was 13.5/1.0 -Check U/A to r/o Infection given falls  and if that still not done yet -Check Vitamin D Level and it was 81.27 and C/w Cholecalciferol 1000 units daily  -Hold Alendronate 70 mg qWeekly  -Give Tranexamic Acid per Protocol -EDP spoke with Orthopedics Dr. Marlou Sa and plan is for surgical Intervention tonight or tomorrow morning -Defer VTE prophylaxis to Orthopedic Surgery when out of Surgery  -Will need Pain Control and Bowel Regimen -PT/OT recommending SNF  Generalized Weakness and Debility with Recurrent Falls -Was Rehabing at [facility] when [place] [facility] -Will need PT/OT to evaluate and Treat and recommending SNF again  Acute  Urinary Retention -Foley Catheter had to be placed 11/29/20 for Acute Urinary Retention -Has previously required In and Out Urethral Catheterization -Suspect due to Dementia -Will need to monitor Post-Operatively and Check Bladder Scan and she was retaining again so will place Foley   Permanent/Chronic Atrial  Fibrillation -Was previously on Coumadin but Dr. Fritzi Mandes discussed risks and benefits of long-term AC with the patient's Husband -He leaned toward discontinuing the Coumadin given the patient's Hx of Falls and Risk of Falls in the Future and Coumadin was stopped Last admission  -CHA2DS2-VASc was 4-5  -Continue to Monitor on Telemetry  -C/w Diltiazem 60 mg po q12h -Check EKG  Compression Fractures -Had complaints of Low back Pain at [facility] and was found to have a T12 and L1 Compression Fx that were Age Indeterminate  -She was given Analgesics -Neurosurgery at that time recommended TLSO Brace and conservative Management   Dyslipidemia -C/w Atorvastatin 10 mg po QHS  Dementia with Behavioral Disturbances -Unable to provide a Subjective Hx to EDP and was unable to provide a subjective history today given her somnolence -She is slightly drowsy and arousable but wants to go back to sleep -C/w Quetiapine 25 mg po qHS -Place on Delirium Precautions  Chronic Diastolic CHF -Currently not decompensated; Holding Lasix 20 mg po Daily  for now -Her Benazepril had been held at D/C on 12/02/20 -Given a bolus of LR in the ED -Strict I's and O's and Daily Weights; Patient is +1.542 Liters  -Continue to Monitor for S/Sx of Volume Overload  Hyponatremia -Mild. Na+ went from 135 on last admission to 130 this admission.  Repeat this morning was 130 -Given a 1 Liter NS bolus -Hold Home Lasix  -Continue to Monitor and Trend -Repeat CMP in the AM   HTN -Hold Amlodipine 5 mg po Daily for now; Her Benazepril was stopped last Hospitalization due to Hypotension -Continue to Monitor BP per Protocol  -Last BP Was   Hypokalemia -Patient presented with a K+ of 3.2 and improved to 4.5 -Continue to Monitor and Replete as Necessary -Repeat CMP in the AM   Hypomagnesemia -Patient's magnesium level is 1.9 -Continue monitor and replete as necessary -Repeat magnesium level in the  a.m.  Hyponatremia -Patient's Na+ went from 130 -> 131 -Continue to Monitor and Trend -Repeat CMP in the AM   Hyperbilirubinemia -Patient's T Bili was elevated to 4.1 but no elevation in LFTs and today it is improved to 2.2 -Likley Reactive but will need to continue to Monitor and Trend Hepatic Fxn Panel and if Necessary will obtain a RUQ U/S -Repeat CMP in the AM  Leukocytosis -Patient's WBC went from 11.8 -> 16.3 and is now improved to 11.8 yesterday and is now 11.9 -Continue to Monitor For S/Sx of Infection; Checking CXR and U/A -Repeat CBC in the AM  Normocytic Anemia -Patient's Hgb/hct went from 9.3/26.9 -> 10.7/31.9 -> 9.0/27.4 -> 7.8/24.3 -Check Anemia Panel and showed an iron level of 30, U IBC of 172, TIBC of 202, saturation ratios of 15%, ferritin level of 243, folate level 14.2, and vitamin B12 of 774 -Expect Hgb to Drop Post-Operatively -Continue to Monitor For S/Sx of Bleeding; Currently no Overt bleeding noted -Repeat CBC in the AM   DVT prophylaxis: Lovenox to be ordered after her surgical intervention but will defer further VTE prophylaxis to orthopedic surgery Code Status: DO NOT RESUSCITATE Family Communication: Discussed with the patient's husband at bedside Disposition Plan: Pending further evaluation by PT and OT and clearance by  orthopedic surgery; Anticipating D/C to SNF in the next 24-48 hours   Status is: Inpatient  Remains inpatient appropriate because:Unsafe d/c plan, IV treatments appropriate due to intensity of illness or inability to take PO and Inpatient level of care appropriate due to severity of illness   Dispo: The patient is from: Home              Anticipated d/c is to: Home              Patient currently is not medically stable to d/c.   Difficult to place patient No   Consultants:   Orthopedic Surgery  Procedures: PROCEDURE:  Left hip hemiarthroplasty using femoral stem Actis cementless size 6, standard collar with self-centering  bipolar head, 28 mm inner diameter, 46 mm outer diameter with a +5 femoral head size.  Antimicrobials:  Anti-infectives (From admission, onward)   Start     Dose/Rate Route Frequency Ordered Stop   12/08/20 0300  ceFAZolin (ANCEF) IVPB 2g/100 mL premix        2 g 200 mL/hr over 30 Minutes Intravenous Every 8 hours 12/07/20 2200 12/08/20 0951   12/07/20 2029  vancomycin (VANCOCIN) powder  Status:  Discontinued          As needed 12/07/20 2030 12/07/20 2159   12/07/20 1830  ceFAZolin (ANCEF) IVPB 2g/100 mL premix        2 g 200 mL/hr over 30 Minutes Intravenous On call to O.R. 12/07/20 1822 12/07/20 1857   12/07/20 1802  ceFAZolin (ANCEF) 2-4 GM/100ML-% IVPB       Note to Pharmacy: Minor, Anneita   : cabinet override      12/07/20 1802 12/07/20 1933   12/06/20 1915  ceFAZolin (ANCEF) IVPB 2g/100 mL premix  Status:  Discontinued        2 g 200 mL/hr over 30 Minutes Intravenous On call to O.R. 12/06/20 1905 12/06/20 2016   12/06/20 1907  ceFAZolin (ANCEF) 2-4 GM/100ML-% IVPB  Status:  Discontinued       Note to Pharmacy: Nicholes Rough   : cabinet override      12/06/20 1907 12/06/20 1952        Subjective: Seen and examined at bedside and and she is much more awake today but extremely hard of hearing and no complaints.  Husband states that she ate a little bit morning.  No other concerns or close at this time.  Objective: Vitals:   12/07/20 2342 12/08/20 0200 12/08/20 0610 12/08/20 0925  BP:  (!) 102/57 111/63 121/66  Pulse:  74 83 84  Resp: 16 16 18 18   Temp:  98.6 F (37 C) 98.1 F (36.7 C) 98.2 F (36.8 C)  TempSrc:  Axillary Oral Oral  SpO2:  97%  100%  Weight:        Intake/Output Summary (Last 24 hours) at 12/08/2020 1317 Last data filed at 12/08/2020 1200 Gross per 24 hour  Intake 3032.64 ml  Output 1490 ml  Net 1542.64 ml   Filed Weights   12/07/20 1039  Weight: 55.8 kg   Examination: Physical Exam:  Constitutional: Patient is a thin chronically ill-appearing  Caucasian female currently in no acute distress is more awake and ate little bit this morning Eyes: Lids and conjunctivae normal, sclerae anicteric  ENMT: External Ears, Nose appear normal.  Extremely hard of hearing Neck: Appears normal, supple, no cervical masses, normal ROM, no appreciable thyromegaly; no JVD Respiratory: Diminished to auscultation bilaterally, no wheezing, rales, rhonchi or crackles.  Normal respiratory effort and patient is not tachypenic. No accessory muscle use.  Unlabored breathing Cardiovascular: RRR, no murmurs / rubs / gallops. S1 and S2 auscultated.  Minimal extremity edema Abdomen: Soft, non-tender, non-distended. Bowel sounds positive.  GU: Deferred. Musculoskeletal: No clubbing / cyanosis of digits/nails. No joint deformity upper and lower extremities.  Skin: No rashes, lesions, ulcers on to skin evaluation. No induration; Warm and dry.  Neurologic: CN 2-12 grossly intact with no focal deficits. Romberg sign and cerebellar reflexes not assessed.  Psychiatric: Impaired judgment and insight. Alert and oriented x 1. Normal mood and appropriate affect.   Data Reviewed: I have personally reviewed following labs and imaging studies  CBC: Recent Labs  Lab 12/06/20 1513 12/07/20 0813 12/08/20 0405  WBC 16.3* 11.8* 11.9*  NEUTROABS 13.1*  --   --   HGB 10.7* 9.0* 7.8*  HCT 31.9* 27.4* 24.3*  MCV 98.2 99.6 100.4*  PLT 490* 391 782   Basic Metabolic Panel: Recent Labs  Lab 12/06/20 1513 12/07/20 0835 12/08/20 0849  NA 130* 130* 131*  K 3.2* 3.3* 4.5  CL 90* 92* 95*  CO2 32 31 28  GLUCOSE 122* 100* 128*  BUN 22 19 16   CREATININE 0.56 0.47 0.53  CALCIUM 8.9 8.3* 8.3*  MG 1.7 1.5* 1.9  PHOS 2.7 3.1 3.4   GFR: Estimated Creatinine Clearance: 42.8 mL/min (by C-G formula based on SCr of 0.53 mg/dL). Liver Function Tests: Recent Labs  Lab 12/06/20 1513 12/07/20 0835 12/08/20 0849  AST 24 17 19   ALT 27 20 16   ALKPHOS 70 58 51  BILITOT 4.1* 2.7* 2.2*   PROT 5.9* 5.0* 5.4*  ALBUMIN 3.3* 2.8* 3.2*   No results for input(s): LIPASE, AMYLASE in the last 168 hours. No results for input(s): AMMONIA in the last 168 hours. Coagulation Profile: Recent Labs  Lab 12/06/20 1513  INR 1.0   Cardiac Enzymes: No results for input(s): CKTOTAL, CKMB, CKMBINDEX, TROPONINI in the last 168 hours. BNP (last 3 results) No results for input(s): PROBNP in the last 8760 hours. HbA1C: No results for input(s): HGBA1C in the last 72 hours. CBG: No results for input(s): GLUCAP in the last 168 hours. Lipid Profile: No results for input(s): CHOL, HDL, LDLCALC, TRIG, CHOLHDL, LDLDIRECT in the last 72 hours. Thyroid Function Tests: No results for input(s): TSH, T4TOTAL, FREET4, T3FREE, THYROIDAB in the last 72 hours. Anemia Panel: Recent Labs    12/08/20 0849  VITAMINB12 774  FOLATE 14.2  FERRITIN 243  TIBC 202*  IRON 30  RETICCTPCT 10.0*   Sepsis Labs: No results for input(s): PROCALCITON, LATICACIDVEN in the last 168 hours.  Recent Results (from the past 240 hour(s))  Resp Panel by RT-PCR (Flu A&B, Covid) Nasopharyngeal Swab     Status: None   Collection Time: 12/02/20 12:24 PM   Specimen: Nasopharyngeal Swab; Nasopharyngeal(NP) swabs in vial transport medium  Result Value Ref Range Status   SARS Coronavirus 2 by RT PCR NEGATIVE NEGATIVE Final    Comment: (NOTE) SARS-CoV-2 target nucleic acids are NOT DETECTED.  The SARS-CoV-2 RNA is generally detectable in upper respiratory specimens during the acute phase of infection. The lowest concentration of SARS-CoV-2 viral copies this assay can detect is 138 copies/mL. A negative result does not preclude SARS-Cov-2 infection and should not be used as the sole basis for treatment or other patient management decisions. A negative result may occur with  improper specimen collection/handling, submission of specimen other than nasopharyngeal swab, presence of viral mutation(s) within the  areas targeted  by this assay, and inadequate number of viral copies(<138 copies/mL). A negative result must be combined with clinical observations, patient history, and epidemiological information. The expected result is Negative.  Fact Sheet for Patients:  EntrepreneurPulse.com.au  Fact Sheet for Healthcare Providers:  IncredibleEmployment.be  This test is no t yet approved or cleared by the [place] FDA and  has been authorized for detection and/or diagnosis of SARS-CoV-2 by FDA under an Emergency Use Authorization (EUA). This EUA will remain  in effect (meaning this test can be used) for the duration of the COVID-19 declaration under Section 564(b)(1) of the Act, 21 U.S.C.section 360bbb-3(b)(1), unless the authorization is terminated  or revoked sooner.       Influenza A by PCR NEGATIVE NEGATIVE Final   Influenza B by PCR NEGATIVE NEGATIVE Final    Comment: (NOTE) The Xpert Xpress SARS-CoV-2/FLU/RSV plus assay is intended as an aid in the diagnosis of influenza from Nasopharyngeal swab specimens and should not be used as a sole basis for treatment. Nasal washings and aspirates are unacceptable for Xpert Xpress SARS-CoV-2/FLU/RSV testing.  Fact Sheet for Patients: EntrepreneurPulse.com.au  Fact Sheet for Healthcare Providers: IncredibleEmployment.be  This test is not yet approved or cleared by the [place] FDA and has been authorized for detection and/or diagnosis of SARS-CoV-2 by FDA under an Emergency Use Authorization (EUA). This EUA will remain in effect (meaning this test can be used) for the duration of the COVID-19 declaration under Section 564(b)(1) of the Act, 21 U.S.C. section 360bbb-3(b)(1), unless the authorization is terminated or revoked.  Performed at [facility], [address] [place], [place] [address]   Resp Panel by RT-PCR (Flu A&B, Covid) Nasopharyngeal Swab     Status:  None   Collection Time: 12/06/20  3:15 PM   Specimen: Nasopharyngeal Swab; Nasopharyngeal(NP) swabs in vial transport medium  Result Value Ref Range Status   SARS Coronavirus 2 by RT PCR NEGATIVE NEGATIVE Final    Comment: (NOTE) SARS-CoV-2 target nucleic acids are NOT DETECTED.  The SARS-CoV-2 RNA is generally detectable in upper respiratory specimens during the acute phase of infection. The lowest concentration of SARS-CoV-2 viral copies this assay can detect is 138 copies/mL. A negative result does not preclude SARS-Cov-2 infection and should not be used as the sole basis for treatment or other patient management decisions. A negative result may occur with  improper specimen collection/handling, submission of specimen other than nasopharyngeal swab, presence of viral mutation(s) within the areas targeted by this assay, and inadequate number of viral copies(<138 copies/mL). A negative result must be combined with clinical observations, patient history, and epidemiological information. The expected result is Negative.  Fact Sheet for Patients:  EntrepreneurPulse.com.au  Fact Sheet for Healthcare Providers:  IncredibleEmployment.be  This test is no t yet approved or cleared by the [place] FDA and  has been authorized for detection and/or diagnosis of SARS-CoV-2 by FDA under an Emergency Use Authorization (EUA). This EUA will remain  in effect (meaning this test can be used) for the duration of the COVID-19 declaration under Section 564(b)(1) of the Act, 21 U.S.C.section 360bbb-3(b)(1), unless the authorization is terminated  or revoked sooner.       Influenza A by PCR NEGATIVE NEGATIVE Final   Influenza B by PCR NEGATIVE NEGATIVE Final    Comment: (NOTE) The Xpert Xpress SARS-CoV-2/FLU/RSV plus assay is intended as an aid in the diagnosis of influenza from Nasopharyngeal swab specimens and should not be used as a sole basis  for  treatment. Nasal washings and aspirates are unacceptable for Xpert Xpress SARS-CoV-2/FLU/RSV testing.  Fact Sheet for Patients: EntrepreneurPulse.com.au  Fact Sheet for Healthcare Providers: IncredibleEmployment.be  This test is not yet approved or cleared by the [place] FDA and has been authorized for detection and/or diagnosis of SARS-CoV-2 by FDA under an Emergency Use Authorization (EUA). This EUA will remain in effect (meaning this test can be used) for the duration of the COVID-19 declaration under Section 564(b)(1) of the Act, 21 U.S.C. section 360bbb-3(b)(1), unless the authorization is terminated or revoked.  Performed at [facility], [place] [address], [place], [place] [address]     RN Pressure Injury Documentation:     Estimated body mass index is 19.86 kg/m as calculated from the following:   Height as of 11/26/20: 5\' 6"  (1.676 m).   Weight as of this encounter: 55.8 kg.  Malnutrition Type: Nutrition Problem: Increased nutrient needs Etiology: post-op healing Malnutrition Characteristics: Signs/Symptoms: estimated needs Nutrition Interventions: Interventions: Ensure Surgery  Radiology Studies: DG Wrist Complete Left  Result Date: 12/06/2020 CLINICAL DATA:  [place] [address] yesterday EXAM: LEFT WRIST - COMPLETE 3+ VIEW COMPARISON:  None FINDINGS: Osseous demineralization. Degenerative changes at radiocarpal joint and at first [facility] joint. Marked volar tilt of distal radial articular surface and deformity of distal radial metaphysis likely sequela of remote fracture. No acute fracture or dislocation identified. IMPRESSION: Osseous demineralization with deformity of the distal LEFT radius, with marked volar tilt of distal radial articular surface, likely representing sequela of remote fracture. Degenerative changes at radiocarpal joint and first [facility] joint. No acute abnormalities. Electronically Signed   By: Lavonia Dana M.D.    On: 12/06/2020 15:56   CT Head Wo Contrast  Result Date: 12/06/2020 CLINICAL DATA:  [place] [address] yesterday, LEFT femur fracture, head trauma, history dementia EXAM: CT HEAD WITHOUT CONTRAST TECHNIQUE: Contiguous axial images were obtained from the base of the skull through the vertex without intravenous contrast. COMPARISON:  None FINDINGS: Brain: Generalized atrophy. Normal ventricular morphology. No midline shift or mass effect. Small vessel chronic ischemic changes of deep cerebral white matter. Otherwise normal appearance of brain parenchyma. No intracranial hemorrhage, mass lesion, or evidence of acute infarction. No extra-axial fluid collections. Vascular: No hyperdense vessels. Atherosclerotic calcification of RIGHT vertebral artery at skull base Skull: Diffuse osseous demineralization.  Intact. Sinuses/Orbits: Clear Other: N/A IMPRESSION: Atrophy with small vessel chronic ischemic changes of deep cerebral white matter. No acute intracranial abnormalities. Electronically Signed   By: Lavonia Dana M.D.   On: 12/06/2020 15:52   Pelvis Portable  Result Date: 12/07/2020 CLINICAL DATA:  Status post left hip arthroplasty. EXAM: PORTABLE PELVIS 1-2 VIEWS COMPARISON:  Preoperative radiograph. FINDINGS: Left hip arthroplasty in expected alignment. No periprosthetic lucency or fracture. Recent postsurgical change includes air and edema in the soft tissues. IMPRESSION: Left hip arthroplasty without immediate postoperative complication. Electronically Signed   By: Keith Rake M.D.   On: 12/07/2020 21:42   Chest Portable 1 View  Result Date: 12/07/2020 CLINICAL DATA:  Fall EXAM: PORTABLE CHEST 1 VIEW COMPARISON:  11/27/2020 FINDINGS: Cardiomegaly. No acute airspace opacity or effusion. No acute bony abnormality. IMPRESSION: Cardiomegaly.  No active disease. Electronically Signed   By: Rolm Baptise M.D.   On: 12/07/2020 08:17   DG C-Arm 1-60 Min-No Report  Result Date: 12/07/2020 Fluoroscopy was utilized by the  requesting physician.  No radiographic interpretation.   DG HIP OPERATIVE UNILAT W OR W/O PELVIS LEFT  Result Date: 12/07/2020 CLINICAL DATA:  Left  hip arthroplasty. EXAM: OPERATIVE LEFT HIP (WITH PELVIS IF PERFORMED) TECHNIQUE: Fluoroscopic spot image(s) were submitted for interpretation post-operatively. COMPARISON:  Preoperative radiograph yesterday. FINDINGS: Two fluoroscopic spot views of the pelvis and left hip performed in the operating room. Left hip arthroplasty. Fluoroscopy time 9 seconds. IMPRESSION: Procedural fluoroscopy for left hip arthroplasty. Electronically Signed   By: Keith Rake M.D.   On: 12/07/2020 21:05   DG HIP UNILAT WITH PELVIS 2-3 VIEWS LEFT  Result Date: 12/06/2020 CLINICAL DATA:  LEFT femur fracture, fell yesterday EXAM: DG HIP (WITH OR WITHOUT PELVIS) 2-3V LEFT COMPARISON:  None FINDINGS: Osseous demineralization. Hip and SI joint spaces preserved. Displaced subcapital fracture of the LEFT femur. No dislocation. Pelvis intact. Degenerative disc and facet disease changes at visualized lumbar spine. IMPRESSION: Displaced subcapital fracture of the LEFT femoral neck. Electronically Signed   By: Lavonia Dana M.D.   On: 12/06/2020 15:54   Scheduled Meds: . sodium chloride   Intravenous Once  . acetaminophen  500 mg Oral Q6H  . aspirin EC  325 mg Oral Q breakfast  . atorvastatin  10 mg Oral QHS  . Chlorhexidine Gluconate Cloth  6 each Topical Daily  . cholecalciferol  1,000 Units Oral Q1200  . diltiazem  60 mg Oral Q12H  . docusate sodium  100 mg Oral BID  . feeding supplement  237 mL Oral BID BM  . ferrous sulfate  325 mg Oral TID PC  . latanoprost  1 drop Both Eyes Q1200  . multivitamin with minerals  1 tablet Oral Daily  . QUEtiapine  25 mg Oral QHS  . senna  1 tablet Oral BID  . timolol  1 drop Both Eyes Q1200   Continuous Infusions: . methocarbamol (ROBAXIN) IV      LOS: 2 days   Kerney Elbe, DO [facility] PAGER is on AMION  If  7PM-7AM, please contact night-coverage [email or web address]

## 2020-12-08 NOTE — Progress Notes (Signed)
Alert with confusion. Shakes head to indicate no pain when asked. Slept well through the n1ght. Was drowsy initially but got more alert as night progressed. Refused to take some medication, closed mouth tight and even tried to Armed forces technical officer. Mittens in place. Turned and repositioned q 2 hours. Foley catheter discontinued at 0600 per orders.

## 2020-12-08 NOTE — [place] FL2 (Signed)
[place] MEDICAID FL2 LEVEL OF CARE SCREENING TOOL     IDENTIFICATION  Patient Name: Latasha Anderson Birthdate: 1930/08/14 Sex: female Admission Date (Current Location): 12/06/2020  [facility] and [place] Number:  Herbalist and Address:  [facility],  [place], [place]      Provider Number: [id number]  Attending Physician Name and Address:  Kerney Elbe, DO  Relative Name and Phone Number:  [place],Cecil Spouse [phone]    Current Level of Care: Hospital Recommended Level of Care: [place] Prior Approval Number:    Date Approved/Denied:   PASRR Number: [id number]  Discharge Plan: SNF    Current Diagnoses: Patient Active Problem List   Diagnosis Date Noted  . Hip fracture ([place]) 12/06/2020  . Generalized weakness 11/27/2020  . Low back pain 11/27/2020  . Frequent falls 11/27/2020  . (HFpEF) heart failure with preserved ejection fraction ([place])   . Chronic atrial fibrillation ([place]) 05/01/2015  . Atrophic vaginitis 12/15/2014  . Breast CA ([place]) 12/15/2014  . Decrease in the ability to hear 12/15/2014  . Essential (primary) hypertension 12/15/2014  . Fatigue 12/15/2014  . Acid reflux 12/15/2014  . Glaucoma 12/15/2014  . Flu vaccine need 12/15/2014  . Blood glucose elevated 12/15/2014  . Arthritis, degenerative 12/15/2014  . OP (osteoporosis) 12/15/2014  . HLD (hyperlipidemia) 12/15/2014  . Pain of upper abdomen 12/15/2014  . Encounter for therapeutic drug monitoring 09/04/2013  . Preop cardiovascular exam 03/07/2013  . Hypokalemia   . Long term (current) use of anticoagulants 08/22/2012  . Atrial fibrillation ([place])   . Dyslipidemia   . Hypertension   . Breast cancer ([place])   . Shortness of breath   . Chest pain   . Ejection fraction   . Mitral regurgitation   . SHINGLES 07/26/2010    Orientation RESPIRATION BLADDER Height & Weight     Self  Normal Incontinent Weight: 55.8 kg Height:      BEHAVIORAL SYMPTOMS/MOOD NEUROLOGICAL BOWEL NUTRITION STATUS      Incontinent Diet  AMBULATORY STATUS COMMUNICATION OF NEEDS Skin   Extensive Assist Verbally Surgical wounds,Skin abrasions (Left Hip surgical wound, Left shoulder bruising from a fall)                       Personal Care Assistance Level of Assistance  Bathing,Feeding,Dressing Bathing Assistance: Maximum assistance Feeding assistance: Limited assistance Dressing Assistance: Maximum assistance     Functional Limitations Info  Sight,Hearing,Speech Sight Info: Adequate Hearing Info: Adequate Speech Info: Adequate    SPECIAL CARE FACTORS FREQUENCY  PT (By licensed PT),OT (By licensed OT)     PT Frequency: x5 week OT Frequency: x5 week            Contractures Contractures Info: Not present    Additional Factors Info  Code Status,Allergies Code Status Info: DNR Allergies Info: Ciprofloxacin, Cortisone, Nitrofurantoin Monohyd Macro, Other           Current Medications (12/08/2020):  This is the current hospital active medication list Current Facility-Administered Medications  Medication Dose Route Frequency Provider Last Rate Last Admin  . 0.9 %  sodium chloride infusion (Manually program via Guardrails IV Fluids)   Intravenous Once Nolon Nations, MD      . Derrill Memo ON 12/09/2020] acetaminophen (TYLENOL) tablet 325-650 mg  325-650 mg Oral Q6H PRN Magnant, Charles L, PA-C      . acetaminophen (TYLENOL) tablet 500 mg  500 mg Oral Q6H Magnant, Charles L, PA-C  500 mg at 12/08/20 1300  . aspirin EC tablet 325 mg  325 mg Oral Q breakfast Magnant, Charles L, PA-C   325 mg at 12/08/20 5361  . atorvastatin (LIPITOR) tablet 10 mg  10 mg Oral QHS Sheikh, Omair Latif, DO      . bisacodyl (DULCOLAX) suppository 10 mg  10 mg Rectal Daily PRN Raiford Noble Latif, DO      . Chlorhexidine Gluconate Cloth 2 % PADS 6 each  6 each Topical Daily Sheikh, Omair Latif, DO      . cholecalciferol (VITAMIN D3) tablet 1,000 Units   1,000 Units Oral Q1200 Raiford Noble [place], DO   1,000 Units at 12/08/20 1158  . diltiazem (CARDIZEM SR) 12 hr capsule 60 mg  60 mg Oral Q12H Blount, Xenia T, NP   60 mg at 12/08/20 0919  . docusate sodium (COLACE) capsule 100 mg  100 mg Oral BID Magnant, Charles L, PA-C   100 mg at 12/08/20 4431  . feeding supplement (ENSURE SURGERY) liquid 237 mL  237 mL Oral BID BM Sheikh, Omair Latif, DO   50 mL at 12/08/20 1244  . ferrous sulfate tablet 325 mg  325 mg Oral TID PC Sheikh, Omair [place], DO   325 mg at 12/08/20 1158  . HYDROcodone-acetaminophen (NORCO/VICODIN) 5-325 MG per tablet 1-2 tablet  1-2 tablet Oral Q4H PRN Magnant, Charles L, PA-C      . latanoprost (XALATAN) 0.005 % ophthalmic solution 1 drop  1 drop Both Eyes Q1200 Raiford Noble [place], DO   1 drop at 12/08/20 1159  . menthol-cetylpyridinium (CEPACOL) lozenge 3 mg  1 lozenge Oral PRN Magnant, Charles L, PA-C       Or  . phenol (CHLORASEPTIC) mouth spray 1 spray  1 spray Mouth/Throat PRN Magnant, Charles L, PA-C      . methocarbamol (ROBAXIN) tablet 500 mg  500 mg Oral Q6H PRN Magnant, Charles L, PA-C       Or  . methocarbamol (ROBAXIN) 500 mg in dextrose 5 % 50 mL IVPB  500 mg Intravenous Q6H PRN Magnant, Charles L, PA-C      . metoCLOPramide (REGLAN) tablet 5-10 mg  5-10 mg Oral Q8H PRN Magnant, Charles L, PA-C       Or  . metoCLOPramide (REGLAN) injection 5-10 mg  5-10 mg Intravenous Q8H PRN Magnant, Charles L, PA-C      . morphine 2 MG/ML injection 0.5-1 mg  0.5-1 mg Intravenous Q2H PRN Magnant, Charles L, PA-C      . multivitamin with minerals tablet 1 tablet  1 tablet Oral Daily Raiford Noble [place], DO   1 tablet at 12/08/20 [phone]  . ondansetron (ZOFRAN) tablet 4 mg  4 mg Oral Q6H PRN Magnant, Charles L, PA-C       Or  . ondansetron (ZOFRAN) injection 4 mg  4 mg Intravenous Q6H PRN Magnant, Charles L, PA-C      . polyethylene glycol (MIRALAX / GLYCOLAX) packet 17 g  17 g Oral Daily PRN Sheikh, Omair Latif, DO      . QUEtiapine  (SEROQUEL) tablet 25 mg  25 mg Oral QHS Blount, Xenia T, NP   25 mg at 12/06/20 2320  . senna (SENOKOT) tablet 8.6 mg  1 tablet Oral BID Raiford Noble [place], DO   8.6 mg at 12/08/20 8676  . sodium phosphate (FLEET) 7-19 GM/118ML enema 1 enema  1 enema Rectal Once PRN Sheikh, Omair Latif, DO      . timolol (TIMOPTIC) 0.5 %  ophthalmic solution 1 drop  1 drop Both Eyes Q1200 Raiford Noble [place], DO   1 drop at 12/08/20 1159     Discharge Medications: Please see discharge summary for a list of discharge medications.  Relevant Imaging Results:  Relevant Lab Results:   Additional Information [id number]  Purcell Mouton, RN

## 2020-12-08 NOTE — TOC Progression Note (Signed)
Transition of Care [facility]) - Progression Note    Patient Details  Name: Latasha Anderson MRN: [id number] Date of Birth: Mar 17, 1931  Transition of Care [facility]) CM/SW Contact  Purcell Mouton, RN Phone Number: 12/08/2020, 11:05 AM  Clinical Narrative:    Pt is from [place] will continue to follow for discharge plans.    Expected Discharge Plan: [place] Barriers to Discharge: No Barriers Identified  Expected Discharge Plan and Services Expected Discharge Plan: [place] arrangements for the past 2 months: [place] Expected Discharge Date:  (unknown)                                     Social Determinants of Health (SDOH) Interventions    Readmission Risk Interventions No flowsheet data found.

## 2020-12-08 NOTE — Evaluation (Signed)
Occupational Therapy Evaluation Patient Details Name: Latasha Anderson MRN: [id number] DOB: 1931/02/04 Today's Date: 12/08/2020    History of Present Illness Patient is an 87 year old female that was at rehab and sustained fall resulting in L hip fracture. Patient s/p L anterior hemi arthroplasty. PMH includes recent fall with age interderminate T12/L1 compression fractures, dementia, A-fib   Clinical Impression   Patient oriented to self and knows she is in the hospital, unable to provide further history therefore obtained from chart/previous hospitalization in May. Patient needing increased time to follow directions, may partially be due to hard of hearing "I can't hear you very well." Patient presents with weakness, increased pain with mobility limiting her activity tolerance and sitting balance. Patient needing max A for bed mobility and mod to max A for static sitting due to posterior lean. Patient repeating "it hurts" therefore returned to semi-supine, mits on. Recommend continued acute OT services to maximize patient strength and activity tolerance to reduce caregiver burden and facilitate D/C to venue listed below.    Follow Up Recommendations  SNF;Supervision/Assistance - 24 hour    Equipment Recommendations  3 in 1 bedside commode;Tub/shower seat;Other (comment) (rolling walker)       Precautions / Restrictions Precautions Precautions: Fall;Back Precaution Comments: LSO when pt is out of bed Required Braces or Orthoses: Spinal Brace Spinal Brace Comments: per recent hospitalization notes 11/29/20 LSO for OOB activity Restrictions LLE Weight Bearing: Weight bearing as tolerated      Mobility Bed Mobility Overal bed mobility: Needs Assistance Bed Mobility: Sidelying to Sit;Sit to Sidelying   Sidelying to sit: Max assist     Sit to sidelying: Max assist General bed mobility comments: patient mobilizes R LE however needing assist with L and heavy max A for trunk support.  posterior lean    Transfers Overall transfer level: Needs assistance               General transfer comment: deferred, needing mod to max A at edge of bed due to strong posterior lean and patient reporting pain, and +1 available therefore returned to bed    Balance Overall balance assessment: History of Falls;Needs assistance Sitting-balance support: Feet unsupported Sitting balance-Leahy Scale: Zero Sitting balance - Comments: mod to max A with UEs supported                                   ADL either performed or assessed with clinical judgement   ADL Overall ADL's : Needs assistance/impaired     Grooming: Minimal assistance;Bed level   Upper Body Bathing: Moderate assistance;Bed level   Lower Body Bathing: Total assistance;Bed level   Upper Body Dressing : Maximal assistance;Bed level   Lower Body Dressing: Total assistance;Bed level     Toilet Transfer Details (indicate cue type and reason): unable, patient reports increased pain and with strong posterior lean seated edge of bed Toileting- Clothing Manipulation and Hygiene: Total assistance;Bed level       Functional mobility during ADLs: Maximal assistance General ADL Comments: patient requiring significant assistance with all self care tasks due to pain, cognition, currently has B mits on                  Pertinent Vitals/Pain Pain Assessment: Faces Faces Pain Scale: Hurts even more Pain Location: L hip Pain Descriptors / Indicators: Grimacing;Moaning Pain Intervention(s): Limited activity within patient's tolerance     Hand Dominance  (  did not specify)   Extremity/Trunk Assessment Upper Extremity Assessment Upper Extremity Assessment: Generalized weakness   Lower Extremity Assessment Lower Extremity Assessment: Defer to PT evaluation   Cervical / Trunk Assessment Cervical / Trunk Assessment: Kyphotic   Communication Communication Communication: Other (comment) (limited  verbalization, soft spoken)   Cognition Arousal/Alertness: Awake/alert Behavior During Therapy: Flat affect Overall Cognitive Status: History of cognitive impairments - at baseline                                 General Comments: oriented to self, knows she is in the hospital when asked name of it patient states "I want coffee"              Home Living Family/patient expects to be discharged to:: Assisted living Living Arrangements: Spouse/significant other Available Help at Discharge: Family Type of Home: House Home Access: Stairs to enter Technical brewer of Steps: 4 Entrance Stairs-Rails: None Home Layout: One level     Bathroom Shower/Tub: Tub/shower unit         Home Equipment: Cane - single point   Additional Comments: info obtained from previous hospitalization as patient with hx dementia      Prior Functioning/Environment Level of Independence: Needs assistance  Gait / Transfers Assistance Needed: Prior to most recent fall, pt was I with ambulation using a SPC intermittently ADL's / Homemaking Assistance Needed: Pt's husband completes all the cooking and cleaning 2/2 to cognitive decline   Comments: info obtained from previous hospitalization d/t hx dementia        OT Problem List: Decreased strength;Decreased activity tolerance;Impaired balance (sitting and/or standing);Decreased cognition;Decreased safety awareness;Decreased knowledge of use of DME or AE;Decreased knowledge of precautions;Pain      OT Treatment/Interventions: Self-care/ADL training;DME and/or AE instruction;Therapeutic activities;Balance training;Therapeutic exercise;Energy conservation;Patient/family education    OT Goals(Current goals can be found in the care plan section) Acute Rehab OT Goals Patient Stated Goal: "I want coffee" OT Goal Formulation: With patient Time For Goal Achievement: 12/22/20 Potential to Achieve Goals: Fair  OT Frequency: Min 2X/week     AM-PAC OT "6 Clicks" Daily Activity     Outcome Measure Help from another person eating meals?: A Little Help from another person taking care of personal grooming?: A Little Help from another person toileting, which includes using toliet, bedpan, or urinal?: Total Help from another person bathing (including washing, rinsing, drying)?: A Lot Help from another person to put on and taking off regular upper body clothing?: A Lot Help from another person to put on and taking off regular lower body clothing?: Total 6 Click Score: 12   End of Session Nurse Communication: Mobility status  Activity Tolerance: Patient limited by pain Patient left: in bed;with call bell/phone within reach;with bed alarm set (mits on)  OT Visit Diagnosis: Other abnormalities of gait and mobility (R26.89);Muscle weakness (generalized) (M62.81);Pain Pain - Right/Left: Left Pain - part of body: Hip                Time: 0623-7628 OT Time Calculation (min): 20 min Charges:  OT General Charges $OT Visit: 1 Visit OT Evaluation $OT Eval Low Complexity: [place] OT OT pager: [phone]  Rosemary Holms 12/08/2020, 9:07 AM

## 2020-12-08 NOTE — TOC Progression Note (Signed)
Transition of Care [facility]) - Progression Note    Patient Details  Name: Latasha Anderson MRN: [id number] Date of Birth: 06-30-1931  Transition [facility]) CM/SW Contact  Purcell Mouton, RN Phone Number: 12/08/2020, 1:50 PM  Clinical Narrative:     Pt from [facility]. A call was made to pt's spouse with no answer. FL2 completed, sent to [facility].   Expected Discharge Plan: [place] Barriers to Discharge: No Barriers Identified  Expected Discharge Plan and Services Expected Discharge Plan: [place] arrangements for the past 2 months: [place] Expected Discharge Date:  (unknown)                                     Social Determinants of Health (SDOH) Interventions    Readmission Risk Interventions No flowsheet data found.

## 2020-12-08 NOTE — Evaluation (Signed)
Physical Therapy Evaluation Patient Details Name: Latasha Anderson MRN: [id number] DOB: 06/09/1931 Today's Date: 12/08/2020   History of Present Illness  Patient is an 87 year old female that was at rehab and sustained fall resulting in L hip fracture. Patient s/p L anterior hemi arthroplasty. PMH includes recent fall with age interderminate T12/L1 compression fractures, dementia, A-fib  Clinical Impression  Pt admitted with above diagnosis.  Pt agreeable to OOB with time and coaxing. Min assist to stand and pivot to recliner. Recommend return to [employer].  Pt had pulled mittens off, IV out as well as disconnected her purewick. RN made aware. mittens placed on pt prior to Pt departure.  Will follow in acute setting  Pt currently with functional limitations due to the deficits listed below (see PT Problem List). Pt will benefit from skilled PT to increase their independence and safety with mobility to allow discharge to the venue listed below.     Follow Up Recommendations SNF    Equipment Recommendations  None recommended by PT    Recommendations for Other Services       Precautions / Restrictions Precautions Precautions: Fall;Back Precaution Comments: LSO when pt is out of bed per previous notes Required Braces or Orthoses: Spinal Brace Spinal Brace Comments: per recent hospitalization notes 11/29/20 LSO for OOB activity Restrictions Other Position/Activity Restrictions: no LSO in room      Mobility  Bed Mobility Overal bed mobility: Needs Assistance     Sidelying to sit: HOB elevated;Min assist;Mod assist       General bed mobility comments: assist with LLE and trunk to upright, cues for self assist, uses bed rail, incr time    Transfers Overall transfer level: Needs assistance Equipment used: Rolling walker (2 wheeled);1 person hand held assist Transfers: Sit to/from [facility] Sit to Stand: Min assist Stand pivot transfers: Min assist        General transfer comment: pt able to utilize RW to stand, min assist to come to full hip and trunk extension, cues for posture. pt began to let go  of walker, reached for arm of chair, given HHA on opposite side to guide pt to chair  Ambulation/Gait             General Gait Details: NT with +1 assist  Stairs            Wheelchair Mobility    Modified Rankin (Stroke Patients Only)       Balance Overall balance assessment: History of Falls;Needs assistance   Sitting balance-Leahy Scale: Poor Sitting balance - Comments: min assist and multi -modal cues for midline Postural control: Right lateral lean;Posterior lean   Standing balance-Leahy Scale: Poor Standing balance comment: reliant on UEs                             Pertinent Vitals/Pain Pain Assessment: Faces Faces Pain Scale: Hurts a little bit Pain Location: L hip Pain Descriptors / Indicators: Grimacing Pain Intervention(s): Limited activity within patient's tolerance;Monitored during session;Repositioned    Home Living Family/patient expects to be discharged to:: Skilled nursing facility                 Additional Comments: pt from [facility] per notes    Prior Function Level of Independence: Needs assistance               Hand Dominance        Extremity/Trunk Assessment  Upper Extremity Assessment Upper Extremity Assessment: Generalized weakness    Lower Extremity Assessment Lower Extremity Assessment: Generalized weakness;LLE deficits/detail LLE Deficits / Details: not fully tested d/t pain       Communication   Communication: No difficulties  Cognition   Behavior During Therapy: WFL for tasks assessed/performed Overall Cognitive Status: History of cognitive impairments - at baseline                                 General Comments: pt states her husband is out in the [place] mowing the yard. points to the window and says the porch out  there is rotting      General Comments      Exercises     Assessment/Plan    PT Assessment Patient needs continued PT services  PT Problem List Decreased strength;Decreased mobility;Decreased safety awareness;Decreased activity tolerance;Decreased cognition;Decreased knowledge of precautions;Decreased balance;Decreased knowledge of use of DME;Pain       PT Treatment Interventions DME instruction;Therapeutic activities;Gait training;Therapeutic exercise;Patient/family education;Balance training;Functional mobility training    PT Goals (Current goals can be found in the Care Plan section)  Acute Rehab PT Goals Patient Stated Goal: none stated PT Goal Formulation: Patient unable to participate in goal setting Time For Goal Achievement: 12/22/20 Potential to Achieve Goals: Good    Frequency Min 3X/week   Barriers to discharge Decreased caregiver support      Co-evaluation               AM-PAC PT "6 Clicks" Mobility  Outcome Measure Help needed turning from your back to your side while in a flat bed without using bedrails?: A Lot Help needed moving from lying on your back to sitting on the side of a flat bed without using bedrails?: A Lot Help needed moving to and from a bed to a chair (including a wheelchair)?: A Lot Help needed standing up from a chair using your arms (e.g., wheelchair or bedside chair)?: A Lot Help needed to walk in hospital room?: A Lot Help needed climbing 3-5 steps with a railing? : Total 6 Click Score: 11    End of Session Equipment Utilized During Treatment: Gait belt Activity Tolerance: Patient tolerated treatment well Patient left: in chair;with call bell/phone within reach;with chair alarm set;Other (comment) (door and curtain open)   PT Visit Diagnosis: Muscle weakness (generalized) (M62.81);History of falling (Z91.81)    Time: 4765-4650 PT Time Calculation (min) (ACUTE ONLY): 31 min   Charges:   PT Evaluation $PT Eval Low  Complexity: 1 Low PT Treatments $Therapeutic Activity: 8-22 mins        Baxter Flattery, PT  Acute Rehab Dept ([place]) [phone] Pager [phone]  12/08/2020   [facility] 12/08/2020, 4:53 PM

## 2020-12-08 NOTE — Anesthesia Postprocedure Evaluation (Signed)
Anesthesia Post Note  Patient: Latasha Anderson  Procedure(s) Performed: ANTERIOR APPROACH HEMI HIP ARTHROPLASTY (Left Hip)     Patient location during evaluation: PACU Anesthesia Type: General Level of consciousness: sedated Pain management: pain level controlled Vital Signs Assessment: post-procedure vital signs reviewed and stable Respiratory status: spontaneous breathing and respiratory function stable Cardiovascular status: stable Postop Assessment: no apparent nausea or vomiting Anesthetic complications: no   No complications documented.  Last Vitals:  Vitals:   12/08/20 0200 12/08/20 0610  BP: (!) 102/57 111/63  Pulse: 74 83  Resp: 16 18  Temp: 37 C 36.7 C  SpO2: 97%     Last Pain:  Vitals:   12/08/20 0610  TempSrc: Oral  PainSc:                  [place]

## 2020-12-08 NOTE — Op Note (Signed)
NAME: Latasha Anderson, Latasha Anderson MEDICAL RECORD NO: [id number] ACCOUNT NO: [id number] DATE OF BIRTH: 1931-06-08 FACILITY: [facility]ATION: [facility] PHYSICIAN: Yetta Barre. Marlou Sa, MD  Operative Report   PREOPERATIVE DIAGNOSIS:  Left hip fracture, femoral neck displaced.  POSTOPERATIVE DIAGNOSIS:  Left hip fracture, femoral neck displaced.  PROCEDURE:  Left hip hemiarthroplasty using femoral stem Actis cementless size 6, standard collar with self-centering bipolar head, 28 mm inner diameter, 46 mm outer diameter with a +5 femoral head size.  SURGEON:  Meredith Pel, MD  ASSISTANT:  Annie Main, PA  INDICATIONS:  The patient is an 87 year old patient with dementia who does ambulate, who presents for operative management after left hip fracture after explanation of risks and benefits.  DESCRIPTION OF PROCEDURE:  The patient was brought to the operating room where general anesthetic was induced.  Preoperative antibiotics administered.  Timeout was called.  The patient was placed on the Hana bed with the left and right legs well secured.   Left hip region was prescrubbed with alcohol and Betadine and allowed to air dry.  Prepped with DuraPrep solution and draped in a sterile manner.  Wall drape utilized to cover with Ioban.  Timeout was called.  An incision made 2 cm distal and posterior  to the anterior superior iliac crest.  Skin and subcutaneous tissue were sharply divided.  Tensor fascia lata fascia was identified and divided from the anterior superior iliac crest distally.  Crossing branches of the lateral femoral cutaneous nerves  were protected when possible.  Allis retractor was utilized to lift up that anterior tensor fascia lata fascia.  Plane between the rectus and the fascia lata was developed.  Crossing circumflex vessels were ligated.  Cobra retractors were placed on the  superior femoral neck and the inferior femoral neck.  Capsulotomy was performed and marked with a #1 Vicryl suture.  Head was  extracted.  Neck cut was made and revised x1 under fluoroscopy.  At this time, the head was sized to a size 46.  This gave a  very nice suction fit in the socket.  Next, the leg was externally rotated about 130 degrees.  Femoral lift was placed and the leg was taken into extension and adduction.  Conjoined tendon was released.  Broaching was performed after lateralization.   Broaching up to a size 6 was performed, size 6 stem fit nicely.  Trial reduction was performed with +1.5 and +5.  The +5 gave excellent leg length and excellent stability with rotation to 45 degrees and extension to 90.  Very good tension on the soft  tissues.  Trial components were removed.  True components placed.  It should be noted that vancomycin powder placed within the irrigated femoral canal.  Same stability parameters and leg length equality was maintained.  Thorough irrigation performed with  pouring irrigation and vancomycin powder placed over the prosthesis.  Capsule closed using #1 Vicryl suture followed by dead space closure using #1 Vicryl suture.  Fascia lata was closed using #1 Vicryl suture followed by interrupted inverted 0 Vicryl  suture, 2-0 Vicryl suture, 3-0 Monocryl, Steri-Strips and Aquacel dressing.  It should be noted also that some Exparel with Marcaine and saline injected into the incision for postoperative pain relief.  Leg lengths approximately equal at the conclusion  of the case, both legs did externally rotate.  The patient tolerated the procedure well without immediate complication.  Luke's assistance was required for opening and closing, retraction, limb mobilization, tissue mobilization.  His assistance was  medical necessity.   SHW D: 12/07/2020 9:20:46 pm T: 12/08/2020 2:13:00 am  JOB: [id number]/ [id number]

## 2020-12-08 NOTE — Progress Notes (Signed)
  Subjective: Latasha Anderson is a 87 y.o. female s/p left hip hemiarthroplasty.  They are POD 1.  Pt's pain is controlled.  Pt has ambulated with great difficulty.  She was able to work with physical therapy earlier today and was able to stand and transfer to the recliner.  She does have a history of advanced dementia but does not endorse any complaints of significant out-of-control pain.  Daughter is in the room and she reports that her mother seems comfortable.  Daughter is inquiring about hospice care in the future for her mother.   Objective: Vital signs in last 24 hours: Temp:  [97.6 F (36.4 C)-98.7 F (37.1 C)] 98.4 F (36.9 C) (06/07 1830) Pulse Rate:  [74-91] 88 (06/07 1830) Resp:  [11-18] 16 (06/07 1830) BP: (102-127)/(54-77) 116/56 (06/07 1830) SpO2:  [91 %-100 %] 97 % (06/07 1830)  Intake/Output from previous day: 06/06 0701 - 06/07 0700 In: 2452.6 [I.V.:950; IV Piggyback:1502.6] Out: 1490 [Urine:1340; Blood:150] Intake/Output this shift: Total I/O In: 580 [P.O.:480; IV Piggyback:100] Out: -   Exam:  No gross blood or drainage overlying the dressing.  Very mild erythema overlying the distal border of the dressing. Left foot warm and well-perfused Sensation intact distally in the left foot Able to dorsiflex and plantarflex the left foot   Labs: Recent Labs    12/06/20 1513 12/07/20 0813 12/08/20 0405  HGB 10.7* 9.0* 7.8*   Recent Labs    12/07/20 0813 12/08/20 0405 12/08/20 0849  WBC 11.8* 11.9*  --   RBC 2.75* 2.42* 2.51*  HCT 27.4* 24.3*  --   PLT 391 346  --    Recent Labs    12/07/20 0835 12/08/20 0849  NA 130* 131*  K 3.3* 4.5  CL 92* 95*  CO2 31 28  BUN 19 16  CREATININE 0.47 0.53  GLUCOSE 100* 128*  CALCIUM 8.3* 8.3*   Recent Labs    12/06/20 1513  INR 1.0    Assessment/Plan: Pt is POD 1 s/p left hip hemiarthroplasty.    -Disposition per medical team, appreciate their management.   -Daughter is inquiring about hospice care  for her mother and her preference is that her mother is discharged home with her husband and daughter as caretakers as she feels that would lessen the chance of Latasha Anderson having a fall like she did at the skilled nursing facility.  -WBAT with a walker.  Continue with physical therapy evaluations.  -Okay to shower, dressing is waterproof.  Cautioned patient against soaking dressing in bath/pool/body of water  -Aspirin for DVT prophylaxis.  -Follow-up with Dr. Marlou Sa in clinic 2 weeks postop.     Latasha Anderson 12/08/2020, 6:57 PM

## 2020-12-09 DIAGNOSIS — I4821 Permanent atrial fibrillation: Secondary | ICD-10-CM

## 2020-12-09 LAB — CBC
HCT: 23.7 % — ABNORMAL LOW (ref 36.0–46.0)
Hemoglobin: 7.9 g/dL — ABNORMAL LOW (ref 12.0–15.0)
MCH: 33.3 pg (ref 26.0–34.0)
MCHC: 33.3 g/dL (ref 30.0–36.0)
MCV: 100 fL (ref 80.0–100.0)
Platelets: 358 10*3/uL (ref 150–400)
RBC: 2.37 MIL/uL — ABNORMAL LOW (ref 3.87–5.11)
RDW: 16.2 % — ABNORMAL HIGH (ref 11.5–15.5)
WBC: 11.1 10*3/uL — ABNORMAL HIGH (ref 4.0–10.5)
nRBC: 0 % (ref 0.0–0.2)

## 2020-12-09 MED ORDER — CHLORHEXIDINE GLUCONATE CLOTH 2 % EX PADS
6.0000 | MEDICATED_PAD | Freq: Every day | CUTANEOUS | Status: DC
  Administered 2020-12-10 – 2020-12-11 (×2): 6 via TOPICAL

## 2020-12-09 NOTE — Care Management Important Message (Signed)
Important Message  Patient Details IM Letter given to the Patient. Name: Latasha Anderson MRN: [id number] Date of Birth: 1930/10/27   Medicare Important Message Given:  Yes     Kerin Salen 12/09/2020, 10:45 AM

## 2020-12-09 NOTE — Progress Notes (Signed)
PROGRESS NOTE    Latasha Anderson [facility]  ZOX:[id number] DOB: 03/01/1931 DOA: 12/06/2020 PCP: Jerrol Banana., MD   Brief Narrative:  Latasha Anderson is a 87 y.o. female with medical history significant for but not limited too heart failure with preserved ejection fraction, history of breast cancer status post right mastectomy, history of chest pain, dyslipidemia, hypertension, hypokalemia, permanent atrial fibrillation not on anticoagulation anymore,  history of shingles, as well as other comorbidities who was recently hospitalized last week for a fall and she was found to have age-indeterminate T12/L1 compression fractures.  Subsequently at that time she was found to have generalized weakness and her Coumadin was stopped given her recurrent falls.  She is discharged to skilled nursing facility 12/02/2020 and late last night she fell after her husband left the skilled nursing facility.  Currently she is rehabbing at [facility] and fell and x-rays at the facility revealed a femur fracture.  She presented today MS for pain and inability to ambulate.  She has rotation of her left leg and she is only alert and oriented x1 and to herself.  History is limited due to her significant dementia and most of the history is obtained from the patient's husband and ED providers and documentation.  She fell after 730 last night and complained of pain I had being at [facility].  Husband is hard of hearing himself but states that he took her off Coumadin last week given her recurrent falls.  He states that [facility] did not have any bed rails for the patient and thinks that she fell ambulating to the bathroom.  He states that she denied any other complaint and she has a left wrist deformity from prior fracture.  [facility] was asked admit this patient for left hip fracture and orthopedic surgery was consulted and planning on taking the patient for surgical intervention later this evening however this was not done last night  given that consent was not obtained from the patient's husband.  Consent is now obtained and she underwent Surgical Intervention on 12/07/20. She is POD0 and now PT/OT Recommending SNF. Hgb/Hct dropped slightly will need to monitor for S/Sx of bleeding.   Assessment & Plan:   Acute Left Femur Fracture in the setting of Osteoporosis from recent Fall s/p Left Hip Hemiarthroplasty -Had a Fall at [facility] Xray noted Femur Fx; Rpeat X-Ray showed showed "Displaced subcapital fracture of the LEFT femoral neck." Patient was seen by orthopedics and underwent surgical intervention.  Seems to be stable from a surgical standpoint. -Check Vitamin D Level and it was 81.27 and C/w Cholecalciferol 1000 units daily  -Hold Alendronate 70 mg qWeekly   Generalized Weakness and Debility with Recurrent Falls Will need to go back to skilled nursing facility for rehab  Acute Urinary Retention -Foley Catheter had to be placed 11/29/20 for Acute Urinary Retention Foley catheter was subsequently removed.  Noted to be retaining urine again this morning.  Will place Foley catheter.  Malingerer until she is able to mobilize better.  Permanent/Chronic Atrial Fibrillation -Was previously on Coumadin but Dr. Fritzi Mandes discussed risks and benefits of long-term AC with the patient's Husband -He leaned toward discontinuing the Coumadin given the patient's Hx of Falls and Risk of Falls in the Future and Coumadin was stopped Last admission  -CHA2DS2-VASc was 4-5  -Continue to Monitor on Telemetry  -Noted to be on diltiazem 60 mg twice a day.  Blood pressure noted to be soft.  Continue to monitor for  now.  Compression Fractures -Had complaints of Low back Pain at [facility] and was found to have a T12 and L1 Compression Fx that were Age Indeterminate  -She was given Analgesics -Neurosurgery at that time recommended TLSO Brace and conservative Management   Dyslipidemia -C/w Atorvastatin 10 mg po QHS  Dementia with Behavioral  Disturbances -C/w Quetiapine 25 mg po qHS - Delirium Precautions  Chronic Diastolic CHF Seems to be stable  Hyponatremia Sodium level remains low but stable.  Etiology unclear.  Essential hypertension Benazepril was discontinued during last hospitalization for hypertension.  Amlodipine on hold.  Blood pressure noted to be soft.  Continue to hold amlodipine.  Currently only on diltiazem for her atrial fibrillation.  Hypokalemia/hypomagnesemia Monitor electrolytes.  Potassium and magnesium level noted to be normal today.  Hyperbilirubinemia Bilirubin noted to be elevated but stable.  Reason unclear.  Monitor in the outpatient setting.  Leukocytosis Likely reactive.  Stable  Normocytic Anemia Drop in hemoglobin likely due to surgery and hip fracture.  Hemoglobin stable.  Transfuse for less than 7. Anemia Panel showed an iron level of 30, UIBC of 172, TIBC of 202, saturation ratios of 15%, ferritin level of 243, folate level 14.2, and vitamin B12 of 774   DVT prophylaxis: Aspirin being used for DVT prophylaxis.  Code Status: DO NOT RESUSCITATE Family Communication: No family at bedside Disposition Plan: SNF  Status is: Inpatient  Remains inpatient appropriate because:Unsafe d/c plan, IV treatments appropriate due to intensity of illness or inability to take PO and Inpatient level of care appropriate due to severity of illness   Dispo: The patient is from: Home              Anticipated d/c is to: SNF              Patient currently is not medically stable to d/c.   Difficult to place patient No   Consultants:   Orthopedic Surgery   Procedures: PROCEDURE:  Left hip hemiarthroplasty using femoral stem Actis cementless size 6, standard collar with self-centering bipolar head, 28 mm inner diameter, 46 mm outer diameter with a +5 femoral head size.   Antimicrobials:  Anti-infectives (From admission, onward)   Start     Dose/Rate Route Frequency Ordered Stop   12/08/20  0300  ceFAZolin (ANCEF) IVPB 2g/100 mL premix        2 g 200 mL/hr over 30 Minutes Intravenous Every 8 hours 12/07/20 2200 12/08/20 0951   12/07/20 2029  vancomycin (VANCOCIN) powder  Status:  Discontinued          As needed 12/07/20 2030 12/07/20 2159   12/07/20 1830  ceFAZolin (ANCEF) IVPB 2g/100 mL premix        2 g 200 mL/hr over 30 Minutes Intravenous On call to O.R. 12/07/20 1822 12/07/20 1857   12/07/20 1802  ceFAZolin (ANCEF) 2-4 GM/100ML-% IVPB       Note to Pharmacy: Minor, Anneita   : cabinet override      12/07/20 1802 12/07/20 1933   12/06/20 1915  ceFAZolin (ANCEF) IVPB 2g/100 mL premix  Status:  Discontinued        2 g 200 mL/hr over 30 Minutes Intravenous On call to O.R. 12/06/20 1905 12/06/20 2016   12/06/20 1907  ceFAZolin (ANCEF) 2-4 GM/100ML-% IVPB  Status:  Discontinued       Note to Pharmacy: Nicholes Rough   : cabinet override      12/06/20 1907 12/06/20 1952  Subjective: Patient not very communicative.  Does not appear to be in any discomfort or distress.  Objective: Vitals:   12/08/20 2100 12/09/20 0502 12/09/20 0535 12/09/20 1224  BP: 107/62 (!) 108/53  (!) 99/52  Pulse:  67  73  Resp:  20  18  Temp: 98.4 F (36.9 C) 98 F (36.7 C)  97.8 F (36.6 C)  TempSrc: Oral   Oral  SpO2: 96%  95% 96%  Weight:        Intake/Output Summary (Last 24 hours) at 12/09/2020 1338 Last data filed at 12/09/2020 1200 Gross per 24 hour  Intake 720 ml  Output 1050 ml  Net -330 ml   Filed Weights   12/07/20 1039  Weight: 55.8 kg    Examination:  General appearance: Awake alert.  In no distress Resp: Clear to auscultation bilaterally.  Normal effort Cardio: S1-S2 is normal regular.  No S3-S4.  No rubs murmurs or bruit GI: Abdomen is soft.  Nontender nondistended.  Bowel sounds are present normal.  No masses organomegaly Extremities: No edema.  No bruising noted over the left lower extremity Neurologic:   No focal neurological deficits.     Data Reviewed: I  have personally reviewed following labs and imaging studies  CBC: Recent Labs  Lab 12/06/20 1513 12/07/20 0813 12/08/20 0405 12/09/20 0422  WBC 16.3* 11.8* 11.9* 11.1*  NEUTROABS 13.1*  --   --   --   HGB 10.7* 9.0* 7.8* 7.9*  HCT 31.9* 27.4* 24.3* 23.7*  MCV 98.2 99.6 100.4* 100.0  PLT 490* 391 346 161   Basic Metabolic Panel: Recent Labs  Lab 12/06/20 1513 12/07/20 0835 12/08/20 0849  NA 130* 130* 131*  K 3.2* 3.3* 4.5  CL 90* 92* 95*  CO2 32 31 28  GLUCOSE 122* 100* 128*  BUN 22 19 16   CREATININE 0.56 0.47 0.53  CALCIUM 8.9 8.3* 8.3*  MG 1.7 1.5* 1.9  PHOS 2.7 3.1 3.4   GFR: Estimated Creatinine Clearance: 42.8 mL/min (by C-G formula based on SCr of 0.53 mg/dL). Liver Function Tests: Recent Labs  Lab 12/06/20 1513 12/07/20 0835 12/08/20 0849  AST 24 17 19   ALT 27 20 16   ALKPHOS 70 58 51  BILITOT 4.1* 2.7* 2.2*  PROT 5.9* 5.0* 5.4*  ALBUMIN 3.3* 2.8* 3.2*   Coagulation Profile: Recent Labs  Lab 12/06/20 1513  INR 1.0   Anemia Panel: Recent Labs    12/08/20 0849  VITAMINB12 774  FOLATE 14.2  FERRITIN 243  TIBC 202*  IRON 30  RETICCTPCT 10.0*     Recent Results (from the past 240 hour(s))  Resp Panel by RT-PCR (Flu A&B, Covid) Nasopharyngeal Swab     Status: None   Collection Time: 12/02/20 12:24 PM   Specimen: Nasopharyngeal Swab; Nasopharyngeal(NP) swabs in vial transport medium  Result Value Ref Range Status   SARS Coronavirus 2 by RT PCR NEGATIVE NEGATIVE Final    Comment: (NOTE) SARS-CoV-2 target nucleic acids are NOT DETECTED.  The SARS-CoV-2 RNA is generally detectable in upper respiratory specimens during the acute phase of infection. The lowest concentration of SARS-CoV-2 viral copies this assay can detect is 138 copies/mL. A negative result does not preclude SARS-Cov-2 infection and should not be used as the sole basis for treatment or other patient management decisions. A negative result may occur with  improper specimen  collection/handling, submission of specimen other than nasopharyngeal swab, presence of viral mutation(s) within the areas targeted by this assay, and inadequate number of viral  copies(<138 copies/mL). A negative result must be combined with clinical observations, patient history, and epidemiological information. The expected result is Negative.  Fact Sheet for Patients:  EntrepreneurPulse.com.au  Fact Sheet for Healthcare Providers:  IncredibleEmployment.be  This test is no t yet approved or cleared by the [place] FDA and  has been authorized for detection and/or diagnosis of SARS-CoV-2 by FDA under an Emergency Use Authorization (EUA). This EUA will remain  in effect (meaning this test can be used) for the duration of the COVID-19 declaration under Section 564(b)(1) of the Act, 21 U.S.C.section 360bbb-3(b)(1), unless the authorization is terminated  or revoked sooner.       Influenza A by PCR NEGATIVE NEGATIVE Final   Influenza B by PCR NEGATIVE NEGATIVE Final    Comment: (NOTE) The Xpert Xpress SARS-CoV-2/FLU/RSV plus assay is intended as an aid in the diagnosis of influenza from Nasopharyngeal swab specimens and should not be used as a sole basis for treatment. Nasal washings and aspirates are unacceptable for Xpert Xpress SARS-CoV-2/FLU/RSV testing.  Fact Sheet for Patients: EntrepreneurPulse.com.au  Fact Sheet for Healthcare Providers: IncredibleEmployment.be  This test is not yet approved or cleared by the [place] FDA and has been authorized for detection and/or diagnosis of SARS-CoV-2 by FDA under an Emergency Use Authorization (EUA). This EUA will remain in effect (meaning this test can be used) for the duration of the COVID-19 declaration under Section 564(b)(1) of the Act, 21 U.S.C. section 360bbb-3(b)(1), unless the authorization is terminated or revoked.  Performed at [facility], [address] [place], [place] [address]   Resp Panel by RT-PCR (Flu A&B, Covid) Nasopharyngeal Swab     Status: None   Collection Time: 12/06/20  3:15 PM   Specimen: Nasopharyngeal Swab; Nasopharyngeal(NP) swabs in vial transport medium  Result Value Ref Range Status   SARS Coronavirus 2 by RT PCR NEGATIVE NEGATIVE Final    Comment: (NOTE) SARS-CoV-2 target nucleic acids are NOT DETECTED.  The SARS-CoV-2 RNA is generally detectable in upper respiratory specimens during the acute phase of infection. The lowest concentration of SARS-CoV-2 viral copies this assay can detect is 138 copies/mL. A negative result does not preclude SARS-Cov-2 infection and should not be used as the sole basis for treatment or other patient management decisions. A negative result may occur with  improper specimen collection/handling, submission of specimen other than nasopharyngeal swab, presence of viral mutation(s) within the areas targeted by this assay, and inadequate number of viral copies(<138 copies/mL). A negative result must be combined with clinical observations, patient history, and epidemiological information. The expected result is Negative.  Fact Sheet for Patients:  EntrepreneurPulse.com.au  Fact Sheet for Healthcare Providers:  IncredibleEmployment.be  This test is no t yet approved or cleared by the [place] FDA and  has been authorized for detection and/or diagnosis of SARS-CoV-2 by FDA under an Emergency Use Authorization (EUA). This EUA will remain  in effect (meaning this test can be used) for the duration of the COVID-19 declaration under Section 564(b)(1) of the Act, 21 U.S.C.section 360bbb-3(b)(1), unless the authorization is terminated  or revoked sooner.       Influenza A by PCR NEGATIVE NEGATIVE Final   Influenza B by PCR NEGATIVE NEGATIVE Final    Comment: (NOTE) The Xpert Xpress SARS-CoV-2/FLU/RSV plus assay is  intended as an aid in the diagnosis of influenza from Nasopharyngeal swab specimens and should not be used as a sole basis for treatment. Nasal washings and aspirates are unacceptable for Xpert  Xpress SARS-CoV-2/FLU/RSV testing.  Fact Sheet for Patients: EntrepreneurPulse.com.au  Fact Sheet for Healthcare Providers: IncredibleEmployment.be  This test is not yet approved or cleared by the [place] FDA and has been authorized for detection and/or diagnosis of SARS-CoV-2 by FDA under an Emergency Use Authorization (EUA). This EUA will remain in effect (meaning this test can be used) for the duration of the COVID-19 declaration under Section 564(b)(1) of the Act, 21 U.S.C. section 360bbb-3(b)(1), unless the authorization is terminated or revoked.  Performed at [facility], [place] [address] [place], [place] [address]      Radiology Studies: Pelvis Portable  Result Date: 12/07/2020 CLINICAL DATA:  Status post left hip arthroplasty. EXAM: PORTABLE PELVIS 1-2 VIEWS COMPARISON:  Preoperative radiograph. FINDINGS: Left hip arthroplasty in expected alignment. No periprosthetic lucency or fracture. Recent postsurgical change includes air and edema in the soft tissues. IMPRESSION: Left hip arthroplasty without immediate postoperative complication. Electronically Signed   By: Keith Rake M.D.   On: 12/07/2020 21:42   DG C-Arm 1-60 Min-No Report  Result Date: 12/07/2020 Fluoroscopy was utilized by the requesting physician.  No radiographic interpretation.   DG HIP OPERATIVE UNILAT W OR W/O PELVIS LEFT  Result Date: 12/07/2020 CLINICAL DATA:  Left hip arthroplasty. EXAM: OPERATIVE LEFT HIP (WITH PELVIS IF PERFORMED) TECHNIQUE: Fluoroscopic spot image(s) were submitted for interpretation post-operatively. COMPARISON:  Preoperative radiograph yesterday. FINDINGS: Two fluoroscopic spot views of the pelvis and left hip performed in the  operating room. Left hip arthroplasty. Fluoroscopy time 9 seconds. IMPRESSION: Procedural fluoroscopy for left hip arthroplasty. Electronically Signed   By: Keith Rake M.D.   On: 12/07/2020 21:05   Scheduled Meds: . sodium chloride   Intravenous Once  . aspirin EC  325 mg Oral Q breakfast  . atorvastatin  10 mg Oral QHS  . [START ON 12/10/2020] Chlorhexidine Gluconate Cloth  6 each Topical Daily  . cholecalciferol  1,000 Units Oral Q1200  . diltiazem  60 mg Oral Q12H  . docusate sodium  100 mg Oral BID  . feeding supplement  237 mL Oral BID BM  . ferrous sulfate  325 mg Oral TID PC  . latanoprost  1 drop Both Eyes Q1200  . multivitamin with minerals  1 tablet Oral Daily  . QUEtiapine  25 mg Oral QHS  . senna  1 tablet Oral BID  . timolol  1 drop Both Eyes Q1200   Continuous Infusions: . methocarbamol (ROBAXIN) IV      LOS: 3 days   Latasha Anderson, [facility] PAGER is on AMION  If 7PM-7AM, please contact night-coverage [email or web address]

## 2020-12-09 NOTE — Plan of Care (Signed)
  Problem: Clinical Measurements: Goal: Diagnostic test results will improve Outcome: Progressing   Problem: Activity: Goal: Risk for activity intolerance will decrease Outcome: Progressing   Problem: Nutrition: Goal: Adequate nutrition will be maintained Outcome: Progressing   Problem: Pain Managment: Goal: General experience of comfort will improve Outcome: Progressing   Problem: Safety: Goal: Ability to remain free from injury will improve Outcome: Progressing

## 2020-12-09 NOTE — TOC Progression Note (Signed)
Transition of Care [facility]) - Progression Note    Patient Details  Name: Latasha Anderson MRN: [id number] Date of Birth: 10-29-1930  Transition of Care [facility]) CM/SW Contact  Purcell Mouton, RN Phone Number: 12/09/2020, 1:22 PM  Clinical Narrative:     Spoke with pt's husband concerning pt's disposition. Pt is from [facility], a call to admission coordinator was made. Pt may return however, need mitts off for 48 hrs before returning to SNF. Husband agreed with pt going back to [facility] when stable.   Expected Discharge Plan: [place] Barriers to Discharge: No Barriers Identified  Expected Discharge Plan and Services Expected Discharge Plan: [place] arrangements for the past 2 months: [place] Expected Discharge Date:  (unknown)                                     Social Determinants of Health (SDOH) Interventions    Readmission Risk Interventions No flowsheet data found.

## 2020-12-10 ENCOUNTER — Ambulatory Visit: Payer: Medicare PPO | Admitting: Urology

## 2020-12-10 DIAGNOSIS — E871 Hypo-osmolality and hyponatremia: Secondary | ICD-10-CM

## 2020-12-10 DIAGNOSIS — Z515 Encounter for palliative care: Secondary | ICD-10-CM

## 2020-12-10 DIAGNOSIS — Z7189 Other specified counseling: Secondary | ICD-10-CM

## 2020-12-10 DIAGNOSIS — R531 Weakness: Secondary | ICD-10-CM

## 2020-12-10 LAB — BASIC METABOLIC PANEL
Anion gap: 5 (ref 5–15)
BUN: 16 mg/dL (ref 8–23)
CO2: 27 mmol/L (ref 22–32)
Calcium: 8.3 mg/dL — ABNORMAL LOW (ref 8.9–10.3)
Chloride: 95 mmol/L — ABNORMAL LOW (ref 98–111)
Creatinine, Ser: 0.42 mg/dL — ABNORMAL LOW (ref 0.44–1.00)
GFR, Estimated: >60 mL/min (ref >60)
Glucose, Bld: 101 mg/dL — ABNORMAL HIGH (ref 70–99)
Potassium: 4.6 mmol/L (ref 3.5–5.1)
Sodium: 127 mmol/L — ABNORMAL LOW (ref 135–145)

## 2020-12-10 LAB — BPAM RBC
Blood Product Expiration Date: 202206262359
Blood Product Expiration Date: 202206262359
Unit Type and Rh: 6200
Unit Type and Rh: 6200

## 2020-12-10 LAB — CBC
HCT: 26.7 % — ABNORMAL LOW (ref 36.0–46.0)
Hemoglobin: 8.5 g/dL — ABNORMAL LOW (ref 12.0–15.0)
MCH: 32.2 pg (ref 26.0–34.0)
MCHC: 31.8 g/dL (ref 30.0–36.0)
MCV: 101.1 fL — ABNORMAL HIGH (ref 80.0–100.0)
Platelets: 383 10*3/uL (ref 150–400)
RBC: 2.64 MIL/uL — ABNORMAL LOW (ref 3.87–5.11)
RDW: 16.5 % — ABNORMAL HIGH (ref 11.5–15.5)
WBC: 13.2 10*3/uL — ABNORMAL HIGH (ref 4.0–10.5)
nRBC: 0 % (ref 0.0–0.2)

## 2020-12-10 LAB — TYPE AND SCREEN
ABO/RH(D): A POS
Antibody Screen: NEGATIVE
Unit division: 0
Unit division: 0

## 2020-12-10 MED ORDER — SODIUM CHLORIDE 0.9 % IV SOLN: INTRAVENOUS | Status: AC

## 2020-12-10 NOTE — Progress Notes (Signed)
Patient is POD 3 s/p left hip hemiarthroplasty.  She is laying comfortably in bed.  No family is around.  She has advanced dementia but does not seem to be in any discomfort at this time.  She has no complaints.  Ortho exam demonstrates dressing intact with no surrounding erythema.  No gross blood or drainage around the dressing.  Left foot warm well perfused.  No pain with logroll of the left hip.  No calf tenderness.  Discussed patient with Robbie's nurse.  Current plan is likely discharge back to [facility], per RN.  Continue weightbearing as tolerated with walker and physical therapy evaluations prior to discharge.  Follow-up with Dr. Marlou Sa in clinic 2 weeks postop.

## 2020-12-10 NOTE — Plan of Care (Signed)
  Problem: Clinical Measurements: Goal: Will remain free from infection Outcome: Progressing Goal: Diagnostic test results will improve Outcome: Progressing   

## 2020-12-10 NOTE — Progress Notes (Signed)
Physical Therapy Treatment Patient Details Name: Latasha Anderson MRN: [id number] DOB: 1931/02/23 Today's Date: 12/10/2020    History of Present Illness Patient is an 87 year old female that was at rehab and sustained fall resulting in L hip fracture. Patient s/p L anterior hemi arthroplasty. PMH includes recent fall with age interderminate T12/L1 compression fractures, dementia, A-fib    PT Comments    Pt requiring +2 assist and increased time with bed mobility, transfers and attempted steps at bedside with RW. Heavy verbal cues for mobility and repeating cues, pt pleasant and reports L hip pain with mobility that improves with rest. Pt with difficulty clearing R foot from floor in standing due to decreased weight-shift L. Pt's spouse in room, states pt has worn LSO 1x and that day she took it off and it didn't come to the hospital with the pt. Pt assisted back to supine at EOS with all needs in reach and spouse at bedside. Will continue acute PT as able.    Follow Up Recommendations  SNF     Equipment Recommendations  None recommended by PT    Recommendations for Other Services       Precautions / Restrictions Precautions Precautions: Fall;Back Precaution Comments: LSO when pt is out of bed per previous notes Required Braces or Orthoses: Spinal Brace Spinal Brace Comments: per recent hospitalization notes 11/29/20 LSO for OOB activity Restrictions Weight Bearing Restrictions: No LLE Weight Bearing: Weight bearing as tolerated Other Position/Activity Restrictions: no LSO in room    Mobility  Bed Mobility Overal bed mobility: Needs Assistance Bed Mobility: Sidelying to Sit;Sit to Sidelying  Sidelying to sit: Mod assist;+2 for physical assistance;HOB elevated  Sit to sidelying: Mod assist;+2 for physical assistance General bed mobility comments: supine<>sit through log rolling to protect back, VCs for hand placement and sequencing, able to inch LEs over to EOB but ultimately  requiring mod A +2 to complete    Transfers Overall transfer level: Needs assistance Equipment used: Rolling walker (2 wheeled) Transfers: Sit to/from Stand Sit to Stand: Min assist;+2 physical assistance  General transfer comment: BLE braced against bed with posterior lean requiring +2 for safety, shift RW anterior to improve anterior weightshift  Ambulation/Gait Ambulation/Gait assistance: Mod assist;+2 physical assistance   Assistive device: Rolling walker (2 wheeled)  General Gait Details: difficulty clearing R foot from floor due to decreased L weight-shift, attempted steps at bedside, pt able to flex L hip in marching fashion, moderate posterior lean   Stairs             Wheelchair Mobility    Modified Rankin (Stroke Patients Only)       Balance Overall balance assessment: History of Falls;Needs assistance Sitting-balance support: Feet supported Sitting balance-Leahy Scale: Poor Sitting balance - Comments: min assist and multi-modal cues for midline Postural control: Posterior lean Standing balance support: During functional activity;Bilateral upper extremity supported Standing balance-Leahy Scale: Zero Standing balance comment: +2, reliant on UE support, posterior lean           Cognition Arousal/Alertness: Awake/alert Behavior During Therapy: Flat affect Overall Cognitive Status: History of cognitive impairments - at baseline  General Comments: pt follows commands with increased time, asks therapist to repeat commands often and states "hard of hearing", frequent education on recent hip surgery due to pain complaints      Exercises      General Comments        Pertinent Vitals/Pain Pain Assessment: Faces Faces Pain Scale: Hurts a little bit Pain  Location: L hip Pain Descriptors / Indicators: Grimacing Pain Intervention(s): Limited activity within patient's tolerance;Monitored during session    Home Living                      Prior  Function            PT Goals (current goals can now be found in the care plan section) Acute Rehab PT Goals Patient Stated Goal: none stated PT Goal Formulation: Patient unable to participate in goal setting Time For Goal Achievement: 12/22/20 Potential to Achieve Goals: Good Progress towards PT goals: Progressing toward goals    Frequency    Min 3X/week      PT Plan Current plan remains appropriate    Co-evaluation              AM-PAC PT "6 Clicks" Mobility   Outcome Measure  Help needed turning from your back to your side while in a flat bed without using bedrails?: A Lot Help needed moving from lying on your back to sitting on the side of a flat bed without using bedrails?: A Lot Help needed moving to and from a bed to a chair (including a wheelchair)?: A Lot Help needed standing up from a chair using your arms (e.g., wheelchair or bedside chair)?: A Lot Help needed to walk in hospital room?: Total Help needed climbing 3-5 steps with a railing? : Total 6 Click Score: 10    End of Session Equipment Utilized During Treatment: Gait belt Activity Tolerance: Patient tolerated treatment well Patient left: in bed;with call bell/phone within reach;with bed alarm set;with family/visitor present Nurse Communication: Mobility status PT Visit Diagnosis: Muscle weakness (generalized) (M62.81);History of falling (Z91.81)     Time: 6378-5885 PT Time Calculation (min) (ACUTE ONLY): 18 min  Charges:  $Therapeutic Activity: 8-22 mins                      Tori Terryn Redner PT, DPT 12/10/20, 10:30 AM

## 2020-12-10 NOTE — Consult Note (Signed)
Consultation Note Date: 12/10/2020   Patient Name: Latasha Anderson  DOB: 06-19-1931  MRN: [id number]  Age / Sex: 87 y.o., female  PCP: Jerrol Banana., MD Referring Physician: Bonnielee Haff, MD  Reason for Consultation: Establishing goals of care  HPI/Patient Profile: 87 y.o. female   admitted on 12/06/2020    Clinical Assessment and Goals of Care: 87 year old lady with medical history of heart failure preserved ejection fraction, history of breast cancer status post right mastectomy, history of dyslipidemia hypertension permanent atrial fibrillation no longer on anticoagulation, history of shingles.  She was hospitalized last week for a fall and was found to have age-indeterminate T12/L1 compression fractures, Coumadin was discontinued at that time and she was discharged to skilled nursing facility in a rehabilitation attempt.  Unfortunately patient suffered a fall and was brought back to the hospital, found to have femur fracture, was admitted to hospital medicine service, was seen and evaluated by orthopedic surgery colleagues.  Today patient is postop day 3 from left hip hemiarthroplasty.  She is trying to participate with physical therapy and trying to improve her oral intake.  Palliative services has been requested for ongoing goals of care discussions.  Patient is awake alert resting in bed.  Her husband is present at the bedside.  Introduced myself and palliative care as follows:  Palliative medicine is specialized medical care for people living with serious illness. It focuses on providing relief from the symptoms and stress of a serious illness. The goal is to improve quality of life for both the patient and the family. Goals of care: Broad aims of medical therapy in relation to the patient's values and preferences. Our aim is to provide medical care aimed at enabling patients to achieve the goals  that matter most to them, given the circumstances of their particular medical situation and their constraints.   Brief life review performed.  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  We discussed about differences between hospice and palliative services and different levels of care with the patient's daughter Jenny Reichmann on the phone.  Discussed about hospice philosophy of care.  Currently, addition of palliative services as an extra layer of support appears to be most prudent.  Please note additional discussions below.  HCPOA Husband and daughter Jenny Reichmann.  Discussion/SUMMARY OF RECOMMENDATIONS   Agree with DO NOT RESUSCITATE Continue current mode of care, monitor patient's oral intake, participation with physical therapy Goals of care discussions and disposition options undertaken briefly with husband at bedside and later on call placed and discussed in detail with daughter Forbes Cellar at [phone]: Goals wishes and values important to the patient and family as a unit attempted to be explored.  Brief life review also performed.  Discussed about serious life limiting illness of advanced dementia and gradual progressive decline trajectory from that standpoint especially in the face of patient's recent fall hip fracture and she is postop day 3 status post left hip hemiarthroplasty now. Plan is for continuation  of rehab trial at either [facility] or another skilled nursing facility closer to patient's house in [place], [place] but with the addition of palliative services following the patient over there to monitor patient's rehab course, symptom burden, activity progression, oral intake and her overall trajectory of illness.  If the patient has ongoing decline at her skilled nursing facility even after the rehab attempt is completed, then would recommend addition of hospice services for focusing exclusively on comfort-focused care either in a  long-term facility or residential hospice if the patient meets criteria at that time. Thank you for the consult.  Code Status/Advance Care Planning: DNR   Symptom Management:     Palliative Prophylaxis:  Delirium Protocol   Psycho-social/Spiritual:  Desire for further Chaplaincy support:yes Additional Recommendations: Caregiving  Support/Resources  Prognosis:  < 12 months  Discharge Planning: [place] for rehab with Palliative care service follow-up      Primary Diagnoses: Present on Admission:  Hip fracture ([place])   I have reviewed the medical record, interviewed the patient and family, and examined the patient. The following aspects are pertinent.  Past Medical History:  Diagnosis Date   (HFpEF) heart failure with preserved ejection fraction ([place])    a. EF 55-65%, January, 2012, question diastolic dysfunction; b. 97/6734 Echo: EF 55-60%, no rwma. Mild MR. Mildly dil LA. Nl RV fxn. PASP 18mmHg.   Breast cancer ([place]) 1985   RT MASTECTOMY   Chest pain    Dyslipidemia    Hypertension    Hypokalemia    February, 2014, potassium started   Mitral regurgitation    Mild, echo, 2012   Permanent atrial fibrillation ([place])    a. Dx 08/2012. CHA2DSD2VASc = 4-5-->warfarin.   Shingles    Shortness of breath    January, 1937, normal systolic function, question diastolic dysfunction   Social History   Socioeconomic History   Marital status: Married    Spouse name: Not on file   Number of children: 1   Years of education: Not on file   Highest education level: Bachelor's degree (e.g., BA, AB, BS)  Occupational History   Not on file  Tobacco Use   Smoking status: Never   Smokeless tobacco: Never  Vaping Use   Vaping Use: Never used  Substance and Sexual Activity   Alcohol use: No    Alcohol/week: 0.0 standard drinks   Drug use: No   Sexual activity: Never  Other Topics Concern   Not on file  Social History Narrative   Not on file   Social  Determinants of Health   Financial Resource Strain: Not on file  Food Insecurity: Not on file  Transportation Needs: Not on file  Physical Activity: Not on file  Stress: Not on file  Social Connections: Not on file   Family History  Problem Relation Age of Onset   Stroke Mother    Diabetes Father    Cancer Brother        prostate   Breast cancer Neg Hx    Scheduled Meds:  aspirin EC  325 mg Oral Q breakfast   atorvastatin  10 mg Oral QHS   Chlorhexidine Gluconate Cloth  6 each Topical Daily   cholecalciferol  1,000 Units Oral Q1200   diltiazem  60 mg Oral Q12H   docusate sodium  100 mg Oral BID   feeding supplement  237 mL Oral BID BM   ferrous sulfate  325 mg Oral TID PC   latanoprost  1 drop  Both Eyes Q1200   multivitamin with minerals  1 tablet Oral Daily   QUEtiapine  25 mg Oral QHS   senna  1 tablet Oral BID   timolol  1 drop Both Eyes Q1200   Continuous Infusions:  sodium chloride 75 mL/hr at 12/10/20 1106   methocarbamol (ROBAXIN) IV     PRN Meds:.acetaminophen, bisacodyl, HYDROcodone-acetaminophen, menthol-cetylpyridinium **OR** phenol, methocarbamol **OR** methocarbamol (ROBAXIN) IV, metoCLOPramide **OR** metoCLOPramide (REGLAN) injection, morphine injection, ondansetron **OR** ondansetron (ZOFRAN) IV, polyethylene glycol, sodium phosphate Medications Prior to Admission:  Prior to Admission medications   Medication Sig Start Date End Date Taking? Authorizing Provider  acetaminophen (TYLENOL) 500 MG tablet Take 500 mg by mouth every 8 (eight) hours as needed (pain).   Yes [provider]  alendronate (FOSAMAX) 70 MG tablet TAKE 1 TABLET (70 MG TOTAL) BY MOUTH EVERY 7 (SEVEN) DAYS. TAKE WITH A FULL GLASS OF WATER ON AN EMPTY STOMACH. Patient taking differently: Take 70 mg by mouth every Wednesday. Take with a full glass of water on an empty stomach. 10/22/20  Yes Jerrol Banana., MD  atorvastatin (LIPITOR) 10 MG tablet TAKE 1 TABLET BY MOUTH EVERY  DAY Patient taking differently: Take 10 mg by mouth at bedtime. 10/12/20  Yes Jerrol Banana., MD  cholecalciferol (VITAMIN D3) 25 MCG (1000 UNIT) tablet Take 1,000 Units by mouth daily at 12 noon.   Yes [provider]  diltiazem (CARDIZEM SR) 60 MG 12 hr capsule Take 60 mg by mouth every 12 (twelve) hours. Hold if BP <110/70   Yes [provider]  furosemide (LASIX) 20 MG tablet TAKE 1 TABLET BY MOUTH EVERY DAY Patient taking differently: Take 20 mg by mouth daily at 12 noon. 08/20/20  Yes Wellington Hampshire, MD  latanoprost (XALATAN) 0.005 % ophthalmic solution Place 1 drop into both eyes daily at 12 noon. 08/01/12  Yes [provider]  QUEtiapine (SEROQUEL) 25 MG tablet Take 1 tablet (25 mg total) by mouth at bedtime. 12/02/20  Yes Fritzi Mandes, MD  timolol (TIMOPTIC) 0.5 % ophthalmic solution Place 1 drop into both eyes daily at 12 noon. 08/05/12  Yes [provider]  amLODipine (NORVASC) 5 MG tablet Take 1 tablet (5 mg total) by mouth daily. Patient not taking: No sig reported 12/02/20   Fritzi Mandes, MD   Allergies  Allergen Reactions   Ciprofloxacin Nausea And Vomiting   Cortisone Other (See Comments)    Pt stated this makes her turn blue   Nitrofurantoin Monohyd Macro Other (See Comments)    GI upset & chills.   Other Other (See Comments)    Mycins - unknown reaction   Review of Systems Denies pain.   Physical Exam Patient is awake and alert sitting up in bed Lungs are clear to auscultation Does not have any abdominal discomfort S1-S2 Patient has baseline dementia, responds appropriately to questions asked.  In no distress  Vital Signs: BP 108/67 (BP Location: Left Arm)   Pulse 82   Temp 98.9 F (37.2 C) (Oral)   Resp 18   Wt 55.8 kg   SpO2 94%   BMI 19.86 kg/m  Pain Scale: Faces   Pain Score: 0-No pain   SpO2: SpO2: 94 % O2 Device:SpO2: 94 % O2 Flow Rate: .O2 Flow Rate (L/min): 2 L/min  IO: Intake/output summary:   Intake/Output Summary (Last 24 hours) at 12/10/2020 1313 Last data filed at 12/10/2020 0930 Gross per 24 hour  Intake 520 ml  Output 400  ml  Net 120 ml    LBM: Last BM Date:  (PTA) Baseline Weight: Weight: 55.8 kg Most recent weight: Weight: 55.8 kg     Palliative Assessment/Data:   PPS 40%  Time In:  12 Time Out:   1300 Time Total:  60  Greater than 50%  of this time was spent counseling and coordinating care related to the above assessment and plan.  Signed by: Loistine Chance, MD   Please contact Palliative Medicine Team phone at [phone] for questions and concerns.  For individual provider: See Shea Evans

## 2020-12-10 NOTE — Progress Notes (Signed)
PROGRESS NOTE    Latasha Anderson [facility]  JSH:[id number] DOB: [birth year] DOA: 12/06/2020 PCP: Jerrol Banana., MD   Brief Narrative:  Ilaria Much is a 87 y.o. female with medical history significant for but not limited too heart failure with preserved ejection fraction, history of breast cancer status post right mastectomy, history of chest pain, dyslipidemia, hypertension, hypokalemia, permanent atrial fibrillation not on anticoagulation anymore,  history of shingles, as well as other comorbidities who was recently hospitalized last week for a fall and she was found to have age-indeterminate T12/L1 compression fractures.  Subsequently at that time she was found to have generalized weakness and her Coumadin was stopped given her recurrent falls.  She is discharged to skilled nursing facility 12/02/2020 and late last night she fell after her husband left the skilled nursing facility.  Currently she is rehabbing at [facility], [place] and fell and x-rays at the facility revealed a femur fracture.  She presented today MS for pain and inability to ambulate.  She has rotation of her left leg and she is only alert and oriented x1 and to herself.  History is limited due to her significant dementia and most of the history is obtained from the patient's husband and ED providers and documentation.  She fell after 730 last night and complained of pain I had being at [facility].  Husband is hard of hearing himself but states that he took her off Coumadin last week given her recurrent falls.  He states that [employer] did not have any bed rails for the patient and thinks that she fell ambulating to the bathroom.  He states that she denied any other complaint and she has a left wrist deformity from prior fracture.  [facility] was asked admit this patient for left hip fracture and orthopedic surgery was consulted and planning on taking the patient for surgical intervention later this evening however this was not done last night  given that consent was not obtained from the patient's husband.  Consent is now obtained and she underwent Surgical Intervention on 12/07/20. She is POD0 and now PT/OT Recommending SNF.     Assessment & Plan:   Acute Left Femur Fracture in the setting of Osteoporosis from recent Fall s/p Left Hip Hemiarthroplasty -Had a Fall at [facility]. Xray noted Femur Fx; Rpeat X-Ray showed showed "Displaced subcapital fracture of the LEFT femoral neck." Patient was seen by orthopedics and underwent surgical intervention.  Seems to be stable from a surgical standpoint. -Vitamin D Level was 81.27. On Cholecalciferol 1000 units daily  -Hold Alendronate 70 mg qWeekly  Pain appears to be reasonably well controlled.   Generalized Weakness and Debility with Recurrent Falls Will need to go back to skilled nursing facility for rehab. Informed by nursing staff that family was interested in pursuing palliative care.  Tried calling husband with no success today.  Palliative care was consulted.  This can also be pursued at the skilled nursing facility.   Acute Urinary Retention Has had multiple episodes of retention.  Has had several in and out catheterization.  Finally a Foley catheter was placed.  Voiding trial once patient is more mobile.   Permanent/Chronic Atrial Fibrillation Was on warfarin previously but based on discussions with family members during previous hospitalization regarding risks and benefits, especially considering her recent falls, anticoagulation was discontinued. -Noted to be on diltiazem 60 mg twice a day.   Seems to be well controlled.  Soft blood pressures noted.     Compression Fractures -  Had complaints of Low back Pain at [facility] and was found to have a T12 and L1 Compression Fx that were Age Indeterminate  -She was given Analgesics -Neurosurgery at that time recommended TLSO Brace and conservative Management  Continue PT and OT.   Dyslipidemia -C/w Atorvastatin 10 mg po QHS   Dementia with  Behavioral Disturbances -C/w Quetiapine 25 mg po qHS - Delirium Precautions   Chronic Diastolic CHF Seems to be stable   Hyponatremia Sodium level noted to be lower today at 127.  We will give her gentle IV hydration and recheck levels tomorrow.  Could be hypovolemic.     Essential hypertension Benazepril was discontinued during last hospitalization for hypertension.   Amlodipine was also placed on hold due to soft blood pressures.   Now only on diltiazem as discussed above.   Hypokalemia/hypomagnesemia Monitor electrolytes periodically.  Potassium and magnesium levels were normal when last checked.    Hyperbilirubinemia Bilirubin noted to be elevated but stable.  Reason unclear.  Monitor in the outpatient setting.  Leukocytosis Likely reactive.  Stable   Normocytic Anemia Drop in hemoglobin likely due to surgery and hip fracture.  Hemoglobin stable.  Transfuse for less than 7. Anemia Panel showed an iron level of 30, UIBC of 172, TIBC of 202, saturation ratios of 15%, ferritin level of 243, folate level 14.2, and vitamin B12 of 774    DVT prophylaxis: Aspirin being used for DVT prophylaxis.  Code Status: DO NOT RESUSCITATE Family Communication: No family at bedside.  Tried calling husband without any success so far. Disposition Plan: SNF  Status is: Inpatient  Remains inpatient appropriate because:Unsafe d/c plan, IV treatments appropriate due to intensity of illness or inability to take PO and Inpatient level of care appropriate due to severity of illness   Dispo: The patient is from: Home              Anticipated d/c is to: SNF              Patient currently is not medically stable to d/c.   Difficult to place patient No   Consultants:  Orthopedic Surgery   Procedures: PROCEDURE:  Left hip hemiarthroplasty using femoral stem Actis cementless size 6, standard collar with self-centering bipolar head, 28 mm inner diameter, 46 mm outer diameter with a +5 femoral head  size.   Antimicrobials:  Anti-infectives (From admission, onward)    Start     Dose/Rate Route Frequency Ordered Stop   12/08/20 0300  ceFAZolin (ANCEF) IVPB 2g/100 mL premix        2 g 200 mL/hr over 30 Minutes Intravenous Every 8 hours 12/07/20 2200 12/08/20 0951   12/07/20 2029  vancomycin (VANCOCIN) powder  Status:  Discontinued          As needed 12/07/20 2030 12/07/20 2159   12/07/20 1830  ceFAZolin (ANCEF) IVPB 2g/100 mL premix        2 g 200 mL/hr over 30 Minutes Intravenous On call to O.R. 12/07/20 1822 12/07/20 1857   12/07/20 1802  ceFAZolin (ANCEF) 2-4 GM/100ML-% IVPB       Note to Pharmacy: Minor, Anneita   : cabinet override      12/07/20 1802 12/07/20 1933   12/06/20 1915  ceFAZolin (ANCEF) IVPB 2g/100 mL premix  Status:  Discontinued        2 g 200 mL/hr over 30 Minutes Intravenous On call to O.R. 12/06/20 1905 12/06/20 2016   12/06/20 1907  ceFAZolin (ANCEF) 2-4 GM/100ML-% IVPB  Status:  Discontinued       Note to Pharmacy: Nicholes Rough   : cabinet override      12/06/20 1907 12/06/20 1952        Subjective: Patient not very communicative.  Does not appear to be in any discomfort or distress.  Objective: Vitals:   12/09/20 0535 12/09/20 1224 12/09/20 2249 12/10/20 0529  BP:  (!) 99/52 (!) 112/56 100/60  Pulse:  73 80 76  Resp:  18 20 20   Temp:  97.8 F (36.6 C) 97.7 F (36.5 C) 97.9 F (36.6 C)  TempSrc:  Oral Oral Axillary  SpO2: 95% 96% 94% 100%  Weight:        Intake/Output Summary (Last 24 hours) at 12/10/2020 1007 Last data filed at 12/10/2020 0930 Gross per 24 hour  Intake 520 ml  Output 1450 ml  Net -930 ml    Filed Weights   12/07/20 1039  Weight: 55.8 kg    Examination:  General appearance: Awake alert.  In no distress.  Pleasantly confused Resp: Clear to auscultation bilaterally.  Normal effort Cardio: S1-S2 is normal regular.  No S3-S4.  No rubs murmurs or bruit GI: Abdomen is soft.  Nontender nondistended.  Bowel sounds are present  normal.  No masses organomegaly Extremities: No edema.   Neurologic: No focal neurological deficits.     Data Reviewed: I have personally reviewed following labs and imaging studies  CBC: Recent Labs  Lab 12/06/20 1513 12/07/20 0813 12/08/20 0405 12/09/20 0422 12/10/20 0424  WBC 16.3* 11.8* 11.9* 11.1* 13.2*  NEUTROABS 13.1*  --   --   --   --   HGB 10.7* 9.0* 7.8* 7.9* 8.5*  HCT 31.9* 27.4* 24.3* 23.7* 26.7*  MCV 98.2 99.6 100.4* 100.0 101.1*  PLT 490* 391 346 358 161    Basic Metabolic Panel: Recent Labs  Lab 12/06/20 1513 12/07/20 0835 12/08/20 0849 12/10/20 0424  NA 130* 130* 131* 127*  K 3.2* 3.3* 4.5 4.6  CL 90* 92* 95* 95*  CO2 32 31 28 27   GLUCOSE 122* 100* 128* 101*  BUN 22 19 16 16   CREATININE 0.56 0.47 0.53 0.42*  CALCIUM 8.9 8.3* 8.3* 8.3*  MG 1.7 1.5* 1.9  --   PHOS 2.7 3.1 3.4  --     GFR: Estimated Creatinine Clearance: 42.8 mL/min (A) (by C-G formula based on SCr of 0.42 mg/dL (L)). Liver Function Tests: Recent Labs  Lab 12/06/20 1513 12/07/20 0835 12/08/20 0849  AST 24 17 19   ALT 27 20 16   ALKPHOS 70 58 51  BILITOT 4.1* 2.7* 2.2*  PROT 5.9* 5.0* 5.4*  ALBUMIN 3.3* 2.8* 3.2*    Coagulation Profile: Recent Labs  Lab 12/06/20 1513  INR 1.0    Anemia Panel: Recent Labs    12/08/20 0849  VITAMINB12 774  FOLATE 14.2  FERRITIN 243  TIBC 202*  IRON 30  RETICCTPCT 10.0*      Recent Results (from the past 240 hour(s))  Resp Panel by RT-PCR (Flu A&B, Covid) Nasopharyngeal Swab     Status: None   Collection Time: 12/02/20 12:24 PM   Specimen: Nasopharyngeal Swab; Nasopharyngeal(NP) swabs in vial transport medium  Result Value Ref Range Status   SARS Coronavirus 2 by RT PCR NEGATIVE NEGATIVE Final    Comment: (NOTE) SARS-CoV-2 target nucleic acids are NOT DETECTED.  The SARS-CoV-2 RNA is generally detectable in upper respiratory specimens during the acute phase of infection. The lowest concentration of SARS-CoV-2 viral  copies this  assay can detect is 138 copies/mL. A negative result does not preclude SARS-Cov-2 infection and should not be used as the sole basis for treatment or other patient management decisions. A negative result may occur with  improper specimen collection/handling, submission of specimen other than nasopharyngeal swab, presence of viral mutation(s) within the areas targeted by this assay, and inadequate number of viral copies(<138 copies/mL). A negative result must be combined with clinical observations, patient history, and epidemiological information. The expected result is Negative.  Fact Sheet for Patients:  EntrepreneurPulse.com.au  Fact Sheet for Healthcare Providers:  IncredibleEmployment.be  This test is no t yet approved or cleared by the [place] FDA and  has been authorized for detection and/or diagnosis of SARS-CoV-2 by FDA under an Emergency Use Authorization (EUA). This EUA will remain  in effect (meaning this test can be used) for the duration of the COVID-19 declaration under Section 564(b)(1) of the Act, 21 U.S.C.section 360bbb-3(b)(1), unless the authorization is terminated  or revoked sooner.       Influenza A by PCR NEGATIVE NEGATIVE Final   Influenza B by PCR NEGATIVE NEGATIVE Final    Comment: (NOTE) The Xpert Xpress SARS-CoV-2/FLU/RSV plus assay is intended as an aid in the diagnosis of influenza from Nasopharyngeal swab specimens and should not be used as a sole basis for treatment. Nasal washings and aspirates are unacceptable for Xpert Xpress SARS-CoV-2/FLU/RSV testing.  Fact Sheet for Patients: EntrepreneurPulse.com.au  Fact Sheet for Healthcare Providers: IncredibleEmployment.be  This test is not yet approved or cleared by the [place] FDA and has been authorized for detection and/or diagnosis of SARS-CoV-2 by FDA under an Emergency Use Authorization (EUA). This  EUA will remain in effect (meaning this test can be used) for the duration of the COVID-19 declaration under Section 564(b)(1) of the Act, 21 U.S.C. section 360bbb-3(b)(1), unless the authorization is terminated or revoked.  Performed at [facility], [address] [place],  [address]   Resp Panel by RT-PCR (Flu A&B, Covid) Nasopharyngeal Swab     Status: None   Collection Time: 12/06/20  3:15 PM   Specimen: Nasopharyngeal Swab; Nasopharyngeal(NP) swabs in vial transport medium  Result Value Ref Range Status   SARS Coronavirus 2 by RT PCR NEGATIVE NEGATIVE Final    Comment: (NOTE) SARS-CoV-2 target nucleic acids are NOT DETECTED.  The SARS-CoV-2 RNA is generally detectable in upper respiratory specimens during the acute phase of infection. The lowest concentration of SARS-CoV-2 viral copies this assay can detect is 138 copies/mL. A negative result does not preclude SARS-Cov-2 infection and should not be used as the sole basis for treatment or other patient management decisions. A negative result may occur with  improper specimen collection/handling, submission of specimen other than nasopharyngeal swab, presence of viral mutation(s) within the areas targeted by this assay, and inadequate number of viral copies(<138 copies/mL). A negative result must be combined with clinical observations, patient history, and epidemiological information. The expected result is Negative.  Fact Sheet for Patients:  EntrepreneurPulse.com.au  Fact Sheet for Healthcare Providers:  IncredibleEmployment.be  This test is no t yet approved or cleared by the [place] FDA and  has been authorized for detection and/or diagnosis of SARS-CoV-2 by FDA under an Emergency Use Authorization (EUA). This EUA will remain  in effect (meaning this test can be used) for the duration of the COVID-19 declaration under Section 564(b)(1) of the Act,  21 U.S.C.section 360bbb-3(b)(1), unless the authorization is terminated  or revoked sooner.  Influenza A by PCR NEGATIVE NEGATIVE Final   Influenza B by PCR NEGATIVE NEGATIVE Final    Comment: (NOTE) The Xpert Xpress SARS-CoV-2/FLU/RSV plus assay is intended as an aid in the diagnosis of influenza from Nasopharyngeal swab specimens and should not be used as a sole basis for treatment. Nasal washings and aspirates are unacceptable for Xpert Xpress SARS-CoV-2/FLU/RSV testing.  Fact Sheet for Patients: EntrepreneurPulse.com.au  Fact Sheet for Healthcare Providers: IncredibleEmployment.be  This test is not yet approved or cleared by the [place] FDA and has been authorized for detection and/or diagnosis of SARS-CoV-2 by FDA under an Emergency Use Authorization (EUA). This EUA will remain in effect (meaning this test can be used) for the duration of the COVID-19 declaration under Section 564(b)(1) of the Act, 21 U.S.C. section 360bbb-3(b)(1), unless the authorization is terminated or revoked.  Performed at [facility], [place] [address] [place], [place] [address]      Radiology Studies: No results found.  Scheduled Meds:  aspirin EC  325 mg Oral Q breakfast   atorvastatin  10 mg Oral QHS   Chlorhexidine Gluconate Cloth  6 each Topical Daily   cholecalciferol  1,000 Units Oral Q1200   diltiazem  60 mg Oral Q12H   docusate sodium  100 mg Oral BID   feeding supplement  237 mL Oral BID BM   ferrous sulfate  325 mg Oral TID PC   latanoprost  1 drop Both Eyes Q1200   multivitamin with minerals  1 tablet Oral Daily   QUEtiapine  25 mg Oral QHS   senna  1 tablet Oral BID   timolol  1 drop Both Eyes Q1200   Continuous Infusions:  sodium chloride     methocarbamol (ROBAXIN) IV      LOS: 4 days   Bonnielee Haff, [facility] PAGER is on AMION  If 7PM-7AM, please contact  night-coverage [email or web address]

## 2020-12-10 NOTE — Progress Notes (Signed)
COVID test complete as ordered.

## 2020-12-11 ENCOUNTER — Encounter: Payer: Self-pay | Admitting: Urology

## 2020-12-11 LAB — CBC
HCT: 24.1 % — ABNORMAL LOW (ref 36.0–46.0)
Hemoglobin: 7.8 g/dL — ABNORMAL LOW (ref 12.0–15.0)
MCH: 32.5 pg (ref 26.0–34.0)
MCHC: 32.4 g/dL (ref 30.0–36.0)
MCV: 100.4 fL — ABNORMAL HIGH (ref 80.0–100.0)
Platelets: 339 10*3/uL (ref 150–400)
RBC: 2.4 MIL/uL — ABNORMAL LOW (ref 3.87–5.11)
RDW: 16.1 % — ABNORMAL HIGH (ref 11.5–15.5)
WBC: 9.4 10*3/uL (ref 4.0–10.5)
nRBC: 0 % (ref 0.0–0.2)

## 2020-12-11 LAB — BASIC METABOLIC PANEL
Anion gap: 5 (ref 5–15)
BUN: 12 mg/dL (ref 8–23)
CO2: 28 mmol/L (ref 22–32)
Calcium: 8.2 mg/dL — ABNORMAL LOW (ref 8.9–10.3)
Chloride: 99 mmol/L (ref 98–111)
Creatinine, Ser: 0.4 mg/dL — ABNORMAL LOW (ref 0.44–1.00)
GFR, Estimated: >60 mL/min (ref >60)
Glucose, Bld: 108 mg/dL — ABNORMAL HIGH (ref 70–99)
Potassium: 3.7 mmol/L (ref 3.5–5.1)
Sodium: 132 mmol/L — ABNORMAL LOW (ref 135–145)

## 2020-12-11 LAB — SARS CORONAVIRUS 2 (TAT 6-24 HRS): SARS Coronavirus 2: NEGATIVE

## 2020-12-11 MED ORDER — FERROUS SULFATE 325 (65 FE) MG PO TABS: 325.0000 mg | ORAL_TABLET | Freq: Three times a day (TID) | ORAL | 3 refills | Status: AC

## 2020-12-11 MED ORDER — DOCUSATE SODIUM 100 MG PO CAPS
100.0000 mg | ORAL_CAPSULE | Freq: Two times a day (BID) | ORAL | 0 refills | Status: DC
Start: 2020-12-11 — End: 2021-02-10

## 2020-12-11 MED ORDER — ADULT MULTIVITAMIN W/MINERALS CH: 1.0000 | ORAL_TABLET | Freq: Every day | ORAL | Status: AC

## 2020-12-11 MED ORDER — SENNA 8.6 MG PO TABS: 1.0000 | ORAL_TABLET | Freq: Two times a day (BID) | ORAL | 0 refills | Status: AC

## 2020-12-11 MED ORDER — ACETAMINOPHEN 325 MG PO TABS: 325.0000 mg | ORAL_TABLET | Freq: Four times a day (QID) | ORAL | 1 refills | Status: DC | PRN

## 2020-12-11 MED ORDER — METHOCARBAMOL 500 MG PO TABS: 500.0000 mg | ORAL_TABLET | Freq: Four times a day (QID) | ORAL | Status: AC | PRN

## 2020-12-11 MED ORDER — ASPIRIN 325 MG PO TBEC: 325.0000 mg | DELAYED_RELEASE_TABLET | Freq: Every day | ORAL | 0 refills | Status: AC

## 2020-12-11 MED ORDER — POLYETHYLENE GLYCOL 3350 17 G PO PACK: 17.0000 g | PACK | Freq: Every day | ORAL | 0 refills | Status: AC | PRN

## 2020-12-11 MED ORDER — HYDROCODONE-ACETAMINOPHEN 5-325 MG PO TABS: 1.0000 | ORAL_TABLET | Freq: Four times a day (QID) | ORAL | 0 refills | Status: DC | PRN

## 2020-12-11 MED ORDER — ATORVASTATIN CALCIUM 10 MG PO TABS: 10.0000 mg | ORAL_TABLET | Freq: Every day | ORAL | Status: DC

## 2020-12-11 MED ORDER — ENSURE SURGERY PO LIQD: 237.0000 mL | Freq: Two times a day (BID) | ORAL | Status: AC

## 2020-12-11 NOTE — Discharge Summary (Signed)
[facility]  Physician Discharge Summary   Patient ID: Latasha Anderson MRN: [id number] DOB/AGE: Jan 18, 1931 87 y.o.  Admit date: 12/06/2020 Discharge date:   12/11/2020   PCP: Jerrol Banana., MD  DISCHARGE DIAGNOSES:  Left femur fracture status post left hip hemiarthroplasty Generalized weakness and debility Chronic atrial fibrillation Acute urinary retention History of thoracic and lumbar compression fractures History of dementia with behavioral disturbances Chronic diastolic CHF Hyponatremia Essential hypertension Normocytic anemia  RECOMMENDATIONS FOR OUTPATIENT FOLLOW UP: Palliative care services to follow at the skilled nursing facility. Please check CBC and basic metabolic panel in 3 to 4 days Voiding trial once patient is more mobile    Home Health: Patient going to SNF Equipment/Devices: Patient with Foley catheter left in place  CODE STATUS: DNR  DISCHARGE CONDITION: fair  Diet recommendation: Regular as tolerated  INITIAL HISTORYLora Laure Leone is a 87 y.o. female with medical history significant for but not limited too heart failure with preserved ejection fraction, history of breast cancer status post right mastectomy, history of chest pain, dyslipidemia, hypertension, hypokalemia, permanent atrial fibrillation not on anticoagulation anymore,  history of shingles, as well as other comorbidities who was recently hospitalized last week for a fall and she was found to have age-indeterminate T12/L1 compression fractures.  Subsequently at that time she was found to have generalized weakness and her Coumadin was stopped given her recurrent falls.  She is discharged to skilled nursing facility 12/02/2020 and late last night she fell after her husband left the skilled nursing facility.  Currently she is rehabbing at [facility] and fell and x-rays at the facility revealed a femur fracture.  She presented today MS for pain and inability to ambulate.   She has rotation of her left leg and she is only alert and oriented x1 and to herself.  History is limited due to her significant dementia and most of the history is obtained from the patient's husband and ED providers and documentation.  She fell after 730 last night and complained of pain I had being at [facility].  Husband is hard of hearing himself but states that he took her off Coumadin last week given her recurrent falls.  He states that [facility] did not have any bed rails for the patient and thinks that she fell ambulating to the bathroom.  He states that she denied any other complaint and she has a left wrist deformity from prior fracture.  [facility] was asked admit this patient for left hip fracture and orthopedic surgery was consulted and planning on taking the patient for surgical intervention later this evening however this was not done last night given that consent was not obtained from the patient's husband.  Consent is now obtained and she underwent Surgical Intervention on 12/07/20. She is POD0 and now PT/OT Recommending SNF.   Consultants:  Orthopedic Surgery     Procedures: PROCEDURE:  Left hip hemiarthroplasty using femoral stem Actis cementless size 6, standard collar with self-centering bipolar head, 28 mm inner diameter, 46 mm outer diameter with a +5 femoral head size.    HOSPITAL COURSE:   Acute Left Femur Fracture in the setting of Osteoporosis from recent Fall s/p Left Hip Hemiarthroplasty -Had a Fall at [facility]. Xray noted Femur Fx; Rpeat X-Ray showed showed "Displaced subcapital fracture of the LEFT femoral neck." Patient was seen by orthopedics and underwent surgical intervention.  Seems to be stable from a surgical standpoint. -Vitamin D Level was 81.27. On Cholecalciferol 1000 units  daily -May resume alendronate 70 mg qWeekly  Pain appears to be reasonably well controlled. Weightbearing as tolerated per orthopedics.  Follow-up with him in 2 weeks.   Generalized Weakness  and Debility with Recurrent Falls Patient with general decline.  Palliative care was seen.  For now the plan is for her to go to skilled nursing facility for rehab.  Depending on her progress further decisions may need to be made about transitioning to hospice if the patient does not progress.   Acute Urinary Retention Has had multiple episodes of retention.  Has had several in and out catheterization.  Finally a Foley catheter was placed.  Voiding trial once patient is more mobile.   Permanent/Chronic Atrial Fibrillation Was on warfarin previously but based on discussions with family members during previous hospitalization regarding risks and benefits, especially considering her recent falls, anticoagulation was discontinued. -Noted to be on diltiazem 60 mg twice a day.   Seems to be well controlled.  Soft blood pressures noted.     Compression Fractures -Had complaints of Low back Pain at [facility] and was found to have a T12 and L1 Compression Fx that were Age Indeterminate -She was given Analgesics -Neurosurgery at that time recommended TLSO Brace and conservative Management     Dyslipidemia -C/w Atorvastatin 10 mg po QHS   Dementia with Behavioral Disturbances -C/w Quetiapine 25 mg po qHS - Delirium Precautions   Chronic Diastolic CHF Seems to be stable.  Has not required diuretics here in the hospital.  Will hold furosemide for now due to hyponatremia.   Hyponatremia Sodium level dropped to 127 yesterday.  Likely due to hypovolemia.  She was gently hydrated with improvement noted this morning.     Essential hypertension Blood pressures have been soft.  We are continuing just diltiazem.  Both the amlodipine and benazepril have been discontinued  above.   Hypokalemia/hypomagnesemia Stable.   Hyperbilirubinemia Bilirubin noted to be elevated but stable.  Reason unclear.  Monitor in the outpatient setting.   Leukocytosis Likely reactive.  Stable   Normocytic Anemia Drop in  hemoglobin likely due to surgery and hip fracture.  Hemoglobin stable.   Anemia Panel showed an iron level of 30, UIBC of 172, TIBC of 202, saturation ratios of 15%, ferritin level of 243, folate level 14.2, and vitamin B12 of 774    Overall stable.  Okay for discharge to skilled nursing facility today.  PERTINENT LABS:  The results of significant diagnostics from this hospitalization (including imaging, microbiology, ancillary and laboratory) are listed below for reference.    Microbiology: Recent Results (from the past 240 hour(s))  Resp Panel by RT-PCR (Flu A&B, Covid) Nasopharyngeal Swab     Status: None   Collection Time: 12/02/20 12:24 PM   Specimen: Nasopharyngeal Swab; Nasopharyngeal(NP) swabs in vial transport medium  Result Value Ref Range Status   SARS Coronavirus 2 by RT PCR NEGATIVE NEGATIVE Final    Comment: (NOTE) SARS-CoV-2 target nucleic acids are NOT DETECTED.  The SARS-CoV-2 RNA is generally detectable in upper respiratory specimens during the acute phase of infection. The lowest concentration of SARS-CoV-2 viral copies this assay can detect is 138 copies/mL. A negative result does not preclude SARS-Cov-2 infection and should not be used as the sole basis for treatment or other patient management decisions. A negative result may occur with  improper specimen collection/handling, submission of specimen other than nasopharyngeal swab, presence of viral mutation(s) within the areas targeted by this assay, and inadequate number of viral copies(<138 copies/mL).  A negative result must be combined with clinical observations, patient history, and epidemiological information. The expected result is Negative.  Fact Sheet for Patients:  EntrepreneurPulse.com.au  Fact Sheet for Healthcare Providers:  IncredibleEmployment.be  This test is no t yet approved or cleared by the [place] FDA and  has been authorized for detection and/or  diagnosis of SARS-CoV-2 by FDA under an Emergency Use Authorization (EUA). This EUA will remain  in effect (meaning this test can be used) for the duration of the COVID-19 declaration under Section 564(b)(1) of the Act, 21 U.S.C.section 360bbb-3(b)(1), unless the authorization is terminated  or revoked sooner.       Influenza A by PCR NEGATIVE NEGATIVE Final   Influenza B by PCR NEGATIVE NEGATIVE Final    Comment: (NOTE) The Xpert Xpress SARS-CoV-2/FLU/RSV plus assay is intended as an aid in the diagnosis of influenza from Nasopharyngeal swab specimens and should not be used as a sole basis for treatment. Nasal washings and aspirates are unacceptable for Xpert Xpress SARS-CoV-2/FLU/RSV testing.  Fact Sheet for Patients: EntrepreneurPulse.com.au  Fact Sheet for Healthcare Providers: IncredibleEmployment.be  This test is not yet approved or cleared by the [place] FDA and has been authorized for detection and/or diagnosis of SARS-CoV-2 by FDA under an Emergency Use Authorization (EUA). This EUA will remain in effect (meaning this test can be used) for the duration of the COVID-19 declaration under Section 564(b)(1) of the Act, 21 U.S.C. section 360bbb-3(b)(1), unless the authorization is terminated or revoked.  Performed at [facility], [address] [place], [place] [address]   Resp Panel by RT-PCR (Flu A&B, Covid) Nasopharyngeal Swab     Status: None   Collection Time: 12/06/20  3:15 PM   Specimen: Nasopharyngeal Swab; Nasopharyngeal(NP) swabs in vial transport medium  Result Value Ref Range Status   SARS Coronavirus 2 by RT PCR NEGATIVE NEGATIVE Final    Comment: (NOTE) SARS-CoV-2 target nucleic acids are NOT DETECTED.  The SARS-CoV-2 RNA is generally detectable in upper respiratory specimens during the acute phase of infection. The lowest concentration of SARS-CoV-2 viral copies this assay can detect is 138  copies/mL. A negative result does not preclude SARS-Cov-2 infection and should not be used as the sole basis for treatment or other patient management decisions. A negative result may occur with  improper specimen collection/handling, submission of specimen other than nasopharyngeal swab, presence of viral mutation(s) within the areas targeted by this assay, and inadequate number of viral copies(<138 copies/mL). A negative result must be combined with clinical observations, patient history, and epidemiological information. The expected result is Negative.  Fact Sheet for Patients:  EntrepreneurPulse.com.au  Fact Sheet for Healthcare Providers:  IncredibleEmployment.be  This test is no t yet approved or cleared by the [place] FDA and  has been authorized for detection and/or diagnosis of SARS-CoV-2 by FDA under an Emergency Use Authorization (EUA). This EUA will remain  in effect (meaning this test can be used) for the duration of the COVID-19 declaration under Section 564(b)(1) of the Act, 21 U.S.C.section 360bbb-3(b)(1), unless the authorization is terminated  or revoked sooner.       Influenza A by PCR NEGATIVE NEGATIVE Final   Influenza B by PCR NEGATIVE NEGATIVE Final    Comment: (NOTE) The Xpert Xpress SARS-CoV-2/FLU/RSV plus assay is intended as an aid in the diagnosis of influenza from Nasopharyngeal swab specimens and should not be used as a sole basis for treatment. Nasal washings and aspirates are unacceptable for Xpert Xpress SARS-CoV-2/FLU/RSV  testing.  Fact Sheet for Patients: EntrepreneurPulse.com.au  Fact Sheet for Healthcare Providers: IncredibleEmployment.be  This test is not yet approved or cleared by the [place] FDA and has been authorized for detection and/or diagnosis of SARS-CoV-2 by FDA under an Emergency Use Authorization (EUA). This EUA will remain in effect (meaning  this test can be used) for the duration of the COVID-19 declaration under Section 564(b)(1) of the Act, 21 U.S.C. section 360bbb-3(b)(1), unless the authorization is terminated or revoked.  Performed at [facility], [place] [address], [place], [place] [address]   SARS CORONAVIRUS 2 (TAT 6-24 HRS) Nasopharyngeal Nasopharyngeal Swab     Status: None   Collection Time: 12/10/20  1:45 PM   Specimen: Nasopharyngeal Swab  Result Value Ref Range Status   SARS Coronavirus 2 NEGATIVE NEGATIVE Final    Comment: (NOTE) SARS-CoV-2 target nucleic acids are NOT DETECTED.  The SARS-CoV-2 RNA is generally detectable in upper and lower respiratory specimens during the acute phase of infection. Negative results do not preclude SARS-CoV-2 infection, do not rule out co-infections with other pathogens, and should not be used as the sole basis for treatment or other patient management decisions. Negative results must be combined with clinical observations, patient history, and epidemiological information. The expected result is Negative.  Fact Sheet for Patients: SugarRoll.be  Fact Sheet for Healthcare Providers: https://www.woods-mathews.com/  This test is not yet approved or cleared by the [place] FDA and  has been authorized for detection and/or diagnosis of SARS-CoV-2 by FDA under an Emergency Use Authorization (EUA). This EUA will remain  in effect (meaning this test can be used) for the duration of the COVID-19 declaration under Se ction 564(b)(1) of the Act, 21 U.S.C. section 360bbb-3(b)(1), unless the authorization is terminated or revoked sooner.  Performed at [facility], [place] [address], [place], [place] [address]      Labs:  COVID-19 Labs  Lab Results  Component Value Date   [place] NEGATIVE 12/10/2020   [place] NEGATIVE 12/06/2020   [place] NEGATIVE 12/02/2020   [place] NEGATIVE 11/27/2020       Basic Metabolic Panel: Recent Labs  Lab 12/06/20 1513 12/07/20 0835 12/08/20 0849 12/10/20 0424 12/11/20 0412  NA 130* 130* 131* 127* 132*  K 3.2* 3.3* 4.5 4.6 3.7  CL 90* 92* 95* 95* 99  CO2 32 31 28 27 28   GLUCOSE 122* 100* 128* 101* 108*  BUN 22 19 16 16 12   CREATININE 0.56 0.47 0.53 0.42* 0.40*  CALCIUM 8.9 8.3* 8.3* 8.3* 8.2*  MG 1.7 1.5* 1.9  --   --   PHOS 2.7 3.1 3.4  --   --    Liver Function Tests: Recent Labs  Lab 12/06/20 1513 12/07/20 0835 12/08/20 0849  AST 24 17 19   ALT 27 20 16   ALKPHOS 70 58 51  BILITOT 4.1* 2.7* 2.2*  PROT 5.9* 5.0* 5.4*  ALBUMIN 3.3* 2.8* 3.2*    CBC: Recent Labs  Lab 12/06/20 1513 12/07/20 0813 12/08/20 0405 12/09/20 0422 12/10/20 0424 12/11/20 0412  WBC 16.3* 11.8* 11.9* 11.1* 13.2* 9.4  NEUTROABS 13.1*  --   --   --   --   --   HGB 10.7* 9.0* 7.8* 7.9* 8.5* 7.8*  HCT 31.9* 27.4* 24.3* 23.7* 26.7* 24.1*  MCV 98.2 99.6 100.4* 100.0 101.1* 100.4*  PLT 490* 391 346 358 383 339     IMAGING STUDIES DG Chest 1 View  Result Date: 11/27/2020 CLINICAL DATA:  Status post fall.  Weakness. Back pain. Dementia. Right mastectomy with reconstruction implant. EXAM: CHEST  1 VIEW COMPARISON:  None. FINDINGS: Slightly prominent cardiac silhouette likely due to AP technique. The heart size and mediastinal contours otherwise unremarkable. Aortic calcification. Biapical pleural/pulmonary scarring. No focal consolidation. No pulmonary edema. No pleural effusion. No pneumothorax. No acute osseous abnormality. Breast implant overlies the right lower hemithorax. IMPRESSION: No active disease. Electronically Signed   By: Iven Finn M.D.   On: 11/27/2020 02:44   DG Lumbar Spine Complete  Result Date: 11/27/2020 CLINICAL DATA:  Fall.  Weakness, back pain EXAM: LUMBAR SPINE - COMPLETE 4+ VIEW COMPARISON:  None. FINDINGS: Markedly limited evaluation due to overlying soft tissues, osseous structures, external object on lateral view.  Slight dextrocurvature of the lumbar spine. Multilevel degenerative changes of the spine. Age-indeterminate T12 and L1 compression fractures. Alignment is grossly unremarkable of the vertebral bodies. Posterior elements not visualized on lateral view. IMPRESSION: 1. Markedly limited evaluation due to overlying soft tissues, osseous structures, external object on lateral view. 2. Age-indeterminate T12 and L1 compression fractures. Electronically Signed   By: Iven Finn M.D.   On: 11/27/2020 02:47   DG Wrist Complete Left  Result Date: 12/06/2020 CLINICAL DATA:  [place] yesterday EXAM: LEFT WRIST - COMPLETE 3+ VIEW COMPARISON:  None FINDINGS: Osseous demineralization. Degenerative changes at radiocarpal joint and at first [facility] joint. Marked volar tilt of distal radial articular surface and deformity of distal radial metaphysis likely sequela of remote fracture. No acute fracture or dislocation identified. IMPRESSION: Osseous demineralization with deformity of the distal LEFT radius, with marked volar tilt of distal radial articular surface, likely representing sequela of remote fracture. Degenerative changes at radiocarpal joint and first [facility] joint. No acute abnormalities. Electronically Signed   By: Lavonia Dana M.D.   On: 12/06/2020 15:56   CT Head Wo Contrast  Result Date: 12/06/2020 CLINICAL DATA:  [place] yesterday, LEFT femur fracture, head trauma, history dementia EXAM: CT HEAD WITHOUT CONTRAST TECHNIQUE: Contiguous axial images were obtained from the base of the skull through the vertex without intravenous contrast. COMPARISON:  None FINDINGS: Brain: Generalized atrophy. Normal ventricular morphology. No midline shift or mass effect. Small vessel chronic ischemic changes of deep cerebral white matter. Otherwise normal appearance of brain parenchyma. No intracranial hemorrhage, mass lesion, or evidence of acute infarction. No extra-axial fluid collections. Vascular: No hyperdense vessels. Atherosclerotic  calcification of RIGHT vertebral artery at skull base Skull: Diffuse osseous demineralization.  Intact. Sinuses/Orbits: Clear Other: N/A IMPRESSION: Atrophy with small vessel chronic ischemic changes of deep cerebral white matter. No acute intracranial abnormalities. Electronically Signed   By: Lavonia Dana M.D.   On: 12/06/2020 15:52   Pelvis Portable  Result Date: 12/07/2020 CLINICAL DATA:  Status post left hip arthroplasty. EXAM: PORTABLE PELVIS 1-2 VIEWS COMPARISON:  Preoperative radiograph. FINDINGS: Left hip arthroplasty in expected alignment. No periprosthetic lucency or fracture. Recent postsurgical change includes air and edema in the soft tissues. IMPRESSION: Left hip arthroplasty without immediate postoperative complication. Electronically Signed   By: Keith Rake M.D.   On: 12/07/2020 21:42   Chest Portable 1 View  Result Date: 12/07/2020 CLINICAL DATA:  Fall EXAM: PORTABLE CHEST 1 VIEW COMPARISON:  11/27/2020 FINDINGS: Cardiomegaly. No acute airspace opacity or effusion. No acute bony abnormality. IMPRESSION: Cardiomegaly.  No active disease. Electronically Signed   By: Rolm Baptise M.D.   On: 12/07/2020 08:17   DG C-Arm 1-60 Min-No Report  Result Date: 12/07/2020 Fluoroscopy was utilized by the requesting physician.  No  radiographic interpretation.   DG HIP OPERATIVE UNILAT W OR W/O PELVIS LEFT  Result Date: 12/07/2020 CLINICAL DATA:  Left hip arthroplasty. EXAM: OPERATIVE LEFT HIP (WITH PELVIS IF PERFORMED) TECHNIQUE: Fluoroscopic spot image(s) were submitted for interpretation post-operatively. COMPARISON:  Preoperative radiograph yesterday. FINDINGS: Two fluoroscopic spot views of the pelvis and left hip performed in the operating room. Left hip arthroplasty. Fluoroscopy time 9 seconds. IMPRESSION: Procedural fluoroscopy for left hip arthroplasty. Electronically Signed   By: Keith Rake M.D.   On: 12/07/2020 21:05   DG HIP UNILAT WITH PELVIS 2-3 VIEWS LEFT  Result Date:  12/06/2020 CLINICAL DATA:  LEFT femur fracture, fell yesterday EXAM: DG HIP (WITH OR WITHOUT PELVIS) 2-3V LEFT COMPARISON:  None FINDINGS: Osseous demineralization. Hip and SI joint spaces preserved. Displaced subcapital fracture of the LEFT femur. No dislocation. Pelvis intact. Degenerative disc and facet disease changes at visualized lumbar spine. IMPRESSION: Displaced subcapital fracture of the LEFT femoral neck. Electronically Signed   By: Lavonia Dana M.D.   On: 12/06/2020 15:54    DISCHARGE EXAMINATION: Vitals:   12/10/20 1216 12/10/20 2106 12/10/20 2113 12/11/20 0602  BP: 108/67 106/63 106/63 (!) 112/59  Pulse: 82  87 88  Resp: 18  16 20   Temp: 98.9 F (37.2 C)  97.7 F (36.5 C) 97.7 F (36.5 C)  TempSrc: Oral  Oral Oral  SpO2: 94%  92% 91%  Weight:       General appearance: Awake alert.  In no distress.  Remains distracted Resp: Clear to auscultation bilaterally.  Normal effort Cardio: S1-S2 is normal regular.  No S3-S4.  No rubs murmurs or bruit GI: Abdomen is soft.  Nontender nondistended.  Bowel sounds are present normal.  No masses organomegaly    DISPOSITION: SNF  Discharge Instructions     Call MD for:  difficulty breathing, headache or visual disturbances   Complete by: As directed    Call MD for:  extreme fatigue   Complete by: As directed    Call MD for:  persistant dizziness or light-headedness   Complete by: As directed    Call MD for:  persistant nausea and vomiting   Complete by: As directed    Call MD for:  severe uncontrolled pain   Complete by: As directed    Call MD for:  temperature >100.4   Complete by: As directed    Diet general   Complete by: As directed    Discharge instructions   Complete by: As directed    Please review instructions on the discharge summary.  You were cared for by a hospitalist during your hospital stay. If you have any questions about your discharge medications or the care you received while you were in the hospital after  you are discharged, you can call the unit and asked to speak with the hospitalist on call if the hospitalist that took care of you is not available. Once you are discharged, your primary care physician will handle any further medical issues. Please note that NO REFILLS for any discharge medications will be authorized once you are discharged, as it is imperative that you return to your primary care physician (or establish a relationship with a primary care physician if you do not have one) for your aftercare needs so that they can reassess your need for medications and monitor your lab values. If you do not have a primary care physician, you can call [phone] for a physician referral.   Increase activity slowly   Complete by:  As directed    No wound care   Complete by: As directed         Allergies as of 12/11/2020       Reactions   Ciprofloxacin Nausea And Vomiting   Cortisone Other (See Comments)   Pt stated this makes her turn blue   Nitrofurantoin Monohyd Macro Other (See Comments)   GI upset & chills.   Other Other (See Comments)   Mycins - unknown reaction        Medication List     STOP taking these medications    amLODipine 5 MG tablet Commonly known as: NORVASC   furosemide 20 MG tablet Commonly known as: LASIX       TAKE these medications    acetaminophen 325 MG tablet Commonly known as: TYLENOL Take 1-2 tablets (325-650 mg total) by mouth every 6 (six) hours as needed for mild pain (pain score 1-3 or temp > 100.5). What changed:  medication strength how much to take when to take this reasons to take this   alendronate 70 MG tablet Commonly known as: FOSAMAX TAKE 1 TABLET (70 MG TOTAL) BY MOUTH EVERY 7 (SEVEN) DAYS. TAKE WITH A FULL GLASS OF WATER ON AN EMPTY STOMACH. What changed: when to take this   aspirin 325 MG EC tablet Take 1 tablet (325 mg total) by mouth daily with breakfast.   atorvastatin 10 MG tablet Commonly known as: LIPITOR Take 1  tablet (10 mg total) by mouth at bedtime.   cholecalciferol 25 MCG (1000 UNIT) tablet Commonly known as: VITAMIN D3 Take 1,000 Units by mouth daily at 12 noon.   diltiazem 60 MG 12 hr capsule Commonly known as: CARDIZEM SR Take 60 mg by mouth every 12 (twelve) hours. Hold if BP <110/70   docusate sodium 100 MG capsule Commonly known as: COLACE Take 1 capsule (100 mg total) by mouth 2 (two) times daily.   feeding supplement Liqd Take 237 mLs by mouth 2 (two) times daily between meals.   ferrous sulfate 325 (65 FE) MG tablet Take 1 tablet (325 mg total) by mouth 3 (three) times daily after meals.   HYDROcodone-acetaminophen 5-325 MG tablet Commonly known as: NORCO/VICODIN Take 1 tablet by mouth every 6 (six) hours as needed for severe pain (pain score 4-6).   latanoprost 0.005 % ophthalmic solution Commonly known as: XALATAN Place 1 drop into both eyes daily at 12 noon.   methocarbamol 500 MG tablet Commonly known as: ROBAXIN Take 1 tablet (500 mg total) by mouth every 6 (six) hours as needed for muscle spasms.   multivitamin with minerals Tabs tablet Take 1 tablet by mouth daily. Start taking on: December 12, 2020   polyethylene glycol 17 g packet Commonly known as: MIRALAX / GLYCOLAX Take 17 g by mouth daily as needed for mild constipation.   QUEtiapine 25 MG tablet Commonly known as: SEROQUEL Take 1 tablet (25 mg total) by mouth at bedtime.   senna 8.6 MG Tabs tablet Commonly known as: SENOKOT Take 1 tablet (8.6 mg total) by mouth 2 (two) times daily.   timolol 0.5 % ophthalmic solution Commonly known as: TIMOPTIC Place 1 drop into both eyes daily at 12 noon.          Follow-up Information     Jerrol Banana., MD Follow up.   Specialty: Family Medicine Contact information: [address] [place] [address] [phone]         Marlou Sa Tonna Corner, MD. Schedule an appointment  as soon as possible for a visit in 2 week(s).   Specialty:  Orthopedic Surgery Contact information: [address] [place] [address] [phone]                 TOTAL DISCHARGE TIME: 35 minutes  [facility] Pager on [email or web address]  12/11/2020, 10:15 AM

## 2020-12-11 NOTE — Progress Notes (Signed)
Patient for discharge to skilled nursing Weightbearing as tolerated Follow-up in 2 weeks for clinical recheck Tylenol and ibuprofen for pain  - aspirin for DVT prophylaxis

## 2020-12-11 NOTE — Progress Notes (Signed)
Occupational Therapy Treatment Patient Details Name: Latasha Anderson MRN: [id number] DOB: 1931/06/20 Today's Date: 12/11/2020    History of present illness Patient is an 87 year old female that was at rehab and sustained fall resulting in L hip fracture. Patient s/p L anterior hemi arthroplasty. PMH includes recent fall with age interderminate T12/L1 compression fractures, dementia, A-fib   OT comments  Patient requiring less assistance with OT this session, pain appears better controlled and patient able to provide more assist. Patient mod A x1 for bed mobility due to posterior lean then able to scoot out toward edge of bed. Patient does need repeated cues for hand placement for stand pivot transfer and mod A for stability as patient has posterior bias. Patient set up A to eat breakfast, spouse present. Will continue to follow acutely.    Follow Up Recommendations  SNF;Supervision/Assistance - 24 hour    Equipment Recommendations  3 in 1 bedside commode;Tub/shower seat;Other (comment) (rolling walker)       Precautions / Restrictions Precautions Precautions: Fall;Back Precaution Comments: LSO when pt is out of bed per previous notes Required Braces or Orthoses: Spinal Brace Spinal Brace: Other (comment) (no LSO in room) Spinal Brace Comments: per recent hospitalization notes 11/29/20 LSO for OOB activity Restrictions LLE Weight Bearing: Weight bearing as tolerated       Mobility Bed Mobility Overal bed mobility: Needs Assistance Bed Mobility: Sidelying to Sit   Sidelying to sit: Mod assist;HOB elevated       General bed mobility comments: mod A for trunk support    Transfers Overall transfer level: Needs assistance Equipment used: None Transfers: Stand Pivot Transfers   Stand pivot transfers: Mod assist       General transfer comment: posterior lean and cues for hand placement    Balance Overall balance assessment: History of Falls;Needs  assistance Sitting-balance support: Feet supported Sitting balance-Leahy Scale: Poor Sitting balance - Comments: needing intermittent assist and use of UEs Postural control: Posterior lean Standing balance support: During functional activity Standing balance-Leahy Scale: Zero Standing balance comment: reliant on UE support, posterior lean                           ADL either performed or assessed with clinical judgement   ADL Overall ADL's : Needs assistance/impaired Eating/Feeding: Set up;Sitting Eating/Feeding Details (indicate cue type and reason): assist to open containers Grooming: Set up;Sitting;Wash/dry face                   Toilet Transfer: Moderate assistance;Stand-pivot;Cueing for safety;Cueing for sequencing Toilet Transfer Details (indicate cue type and reason): to recliner, needs cues for hand placement and mod A for stability due to posterior bias to pivot to chair         Functional mobility during ADLs: Moderate assistance General ADL Comments: patient experiencing less pain and appears less anxious this session therefore requiring 1 assist to pivot to recliner with OT      Cognition Arousal/Alertness: Awake/alert Behavior During Therapy: WFL for tasks assessed/performed Overall Cognitive Status: History of cognitive impairments - at baseline                                 General Comments: patient knows shes in hospital and that she broke her hip, needs min cues to redirect at times  Pertinent Vitals/ Pain       Pain Assessment: Faces Faces Pain Scale: Hurts little more Pain Location: L hip Pain Descriptors / Indicators: Grimacing Pain Intervention(s): Monitored during session         Frequency  Min 2X/week        Progress Toward Goals  OT Goals(current goals can now be found in the care plan section)  Progress towards OT goals: Progressing toward goals  Acute Rehab OT Goals Patient  Stated Goal: wanting to eat breakfast OT Goal Formulation: With patient Time For Goal Achievement: 12/22/20 Potential to Achieve Goals: Fair ADL Goals Pt Will Perform Upper Body Bathing: with min guard assist;sitting Additional ADL Goal #1: Patient will require min A for bed mobility in preparation for self care tasks Additional ADL Goal #2: Patient will demontrate fair sitting balance for 10 minutes in order to participate in self care tasks.  Plan Discharge plan remains appropriate       AM-PAC OT "6 Clicks" Daily Activity     Outcome Measure   Help from another person eating meals?: A Little Help from another person taking care of personal grooming?: A Little Help from another person toileting, which includes using toliet, bedpan, or urinal?: Total Help from another person bathing (including washing, rinsing, drying)?: A Lot Help from another person to put on and taking off regular upper body clothing?: A Lot Help from another person to put on and taking off regular lower body clothing?: Total 6 Click Score: 12    End of Session Equipment Utilized During Treatment: Gait belt  OT Visit Diagnosis: Other abnormalities of gait and mobility (R26.89);Muscle weakness (generalized) (M62.81);Pain Pain - Right/Left: Left Pain - part of body: Hip   Activity Tolerance Patient tolerated treatment well   Patient Left in chair;with call bell/phone within reach;with chair alarm set;with family/visitor present   Nurse Communication Mobility status        Time: 8657-8469 OT Time Calculation (min): 18 min  Charges: OT General Charges $OT Visit: 1 Visit OT Treatments $Self Care/Home Management : 8-22 mins  Delbert Phenix OT OT pager: [phone]   Rosemary Holms 12/11/2020, 11:50 AM

## 2020-12-11 NOTE — Progress Notes (Deleted)
Established patient visit   Patient: Latasha Anderson   DOB: 26-Dec-1930   87 y.o. Female  MRN: [id number] Visit Date: 12/14/2020  Today's healthcare provider: Wilhemena Durie, MD   No chief complaint on file.  Subjective    HPI  Follow up Hospitalization  Patient was admitted to *** on *** and discharged on ***. She was treated for ***. Treatment for this included ***. Telephone follow up was done on *** She reports {excellent/good/fair:19992} compliance with treatment. She reports this condition is {resolved/improved/worsened:23923}.  ----------------------------------------------------------------------------------------- -   {Show patient history (optional):23778}   Medications: Facility-Administered Medications Prior to Visit  Medication Dose Route Frequency Provider   acetaminophen (TYLENOL) tablet 325-650 mg  325-650 mg Oral Q6H PRN Magnant, Gerrianne Scale, PA-C   aspirin EC tablet 325 mg  325 mg Oral Q breakfast Magnant, Charles L, PA-C   atorvastatin (LIPITOR) tablet 10 mg  10 mg Oral QHS Sheikh, Omair Latif, DO   bisacodyl (DULCOLAX) suppository 10 mg  10 mg Rectal Daily PRN Sheikh, Omair Latif, DO   Chlorhexidine Gluconate Cloth 2 % PADS 6 each  6 each Topical Daily Bonnielee Haff, MD   cholecalciferol (VITAMIN D3) tablet 1,000 Units  1,000 Units Oral Q1200 Sheikh, Omair Latif, DO   diltiazem (CARDIZEM SR) 12 hr capsule 60 mg  60 mg Oral Q12H Blount, Lolita Cram, NP   docusate sodium (COLACE) capsule 100 mg  100 mg Oral BID Magnant, Charles L, PA-C   feeding supplement (ENSURE SURGERY) liquid 237 mL  237 mL Oral BID BM Sheikh, Omair Latif, DO   ferrous sulfate tablet 325 mg  325 mg Oral TID PC Sheikh, Omair Latif, DO   HYDROcodone-acetaminophen (NORCO/VICODIN) 5-325 MG per tablet 1-2 tablet  1-2 tablet Oral Q4H PRN Magnant, Charles L, PA-C   latanoprost (XALATAN) 0.005 % ophthalmic solution 1 drop  1 drop Both Eyes Q1200 Sheikh, Omair Latif, DO    menthol-cetylpyridinium (CEPACOL) lozenge 3 mg  1 lozenge Oral PRN Magnant, Charles L, PA-C   Or   phenol (CHLORASEPTIC) mouth spray 1 spray  1 spray Mouth/Throat PRN Magnant, Charles L, PA-C   methocarbamol (ROBAXIN) tablet 500 mg  500 mg Oral Q6H PRN Magnant, Charles L, PA-C   Or   methocarbamol (ROBAXIN) 500 mg in dextrose 5 % 50 mL IVPB  500 mg Intravenous Q6H PRN Magnant, Charles L, PA-C   metoCLOPramide (REGLAN) tablet 5-10 mg  5-10 mg Oral Q8H PRN Magnant, Charles L, PA-C   Or   metoCLOPramide (REGLAN) injection 5-10 mg  5-10 mg Intravenous Q8H PRN Magnant, Charles L, PA-C   morphine 2 MG/ML injection 0.5-1 mg  0.5-1 mg Intravenous Q2H PRN Magnant, Charles L, PA-C   multivitamin with minerals tablet 1 tablet  1 tablet Oral Daily Sheikh, Omair Latif, DO   ondansetron (ZOFRAN) tablet 4 mg  4 mg Oral Q6H PRN Magnant, Charles L, PA-C   Or   ondansetron (ZOFRAN) injection 4 mg  4 mg Intravenous Q6H PRN Magnant, Charles L, PA-C   polyethylene glycol (MIRALAX / GLYCOLAX) packet 17 g  17 g Oral Daily PRN Sheikh, Omair Latif, DO   QUEtiapine (SEROQUEL) tablet 25 mg  25 mg Oral QHS Blount, Lolita Cram, NP   senna (SENOKOT) tablet 8.6 mg  1 tablet Oral BID Sheikh, Omair Latif, DO   sodium phosphate (FLEET) 7-19 GM/118ML enema 1 enema  1 enema Rectal Once PRN Sheikh, Omair Latif, DO   timolol (TIMOPTIC) 0.5 % ophthalmic solution  1 drop  1 drop Both Eyes Q1200 Raiford Noble Dumb [place], DO   Outpatient Medications Prior to Visit  Medication Sig   acetaminophen (TYLENOL) 325 MG tablet Take 1-2 tablets (325-650 mg total) by mouth every 6 (six) hours as needed for mild pain (pain score 1-3 or temp > 100.5).   acetaminophen (TYLENOL) 500 MG tablet Take 500 mg by mouth every 8 (eight) hours as needed (pain).   alendronate (FOSAMAX) 70 MG tablet TAKE 1 TABLET (70 MG TOTAL) BY MOUTH EVERY 7 (SEVEN) DAYS. TAKE WITH A FULL GLASS OF WATER ON AN EMPTY STOMACH.   amLODipine (NORVASC) 5 MG tablet Take 1 tablet (5 mg  total) by mouth daily. (Patient not taking: No sig reported)   aspirin EC 325 MG EC tablet Take 1 tablet (325 mg total) by mouth daily with breakfast.   atorvastatin (LIPITOR) 10 MG tablet Take 1 tablet (10 mg total) by mouth at bedtime.   cholecalciferol (VITAMIN D3) 25 MCG (1000 UNIT) tablet Take 1,000 Units by mouth daily at 12 noon.   diltiazem (CARDIZEM SR) 60 MG 12 hr capsule Take 60 mg by mouth every 12 (twelve) hours. Hold if BP <110/70   docusate sodium (COLACE) 100 MG capsule Take 1 capsule (100 mg total) by mouth 2 (two) times daily.   feeding supplement (ENSURE SURGERY) LIQD Take 237 mLs by mouth 2 (two) times daily between meals.   ferrous sulfate 325 (65 FE) MG tablet Take 1 tablet (325 mg total) by mouth 3 (three) times daily after meals.   furosemide (LASIX) 20 MG tablet TAKE 1 TABLET BY MOUTH EVERY DAY (Patient taking differently: Take 20 mg by mouth daily at 12 noon.)   HYDROcodone-acetaminophen (NORCO/VICODIN) 5-325 MG tablet Take 1 tablet by mouth every 6 (six) hours as needed for severe pain (pain score 4-6).   latanoprost (XALATAN) 0.005 % ophthalmic solution Place 1 drop into both eyes daily at 12 noon.   methocarbamol (ROBAXIN) 500 MG tablet Take 1 tablet (500 mg total) by mouth every 6 (six) hours as needed for muscle spasms.   [START ON 12/12/2020] Multiple Vitamin (MULTIVITAMIN WITH MINERALS) TABS tablet Take 1 tablet by mouth daily.   polyethylene glycol (MIRALAX / GLYCOLAX) 17 g packet Take 17 g by mouth daily as needed for mild constipation.   QUEtiapine (SEROQUEL) 25 MG tablet Take 1 tablet (25 mg total) by mouth at bedtime.   senna (SENOKOT) 8.6 MG TABS tablet Take 1 tablet (8.6 mg total) by mouth 2 (two) times daily.   timolol (TIMOPTIC) 0.5 % ophthalmic solution Place 1 drop into both eyes daily at 12 noon.    Review of Systems  {Labs  Heme  Chem  Endocrine  Serology  Results Review (optional):23779}   Objective    There were no vitals taken for this  visit. {Show previous vital signs (optional):23777}   Physical Exam  ***  No results found for any visits on 12/14/20.  Assessment & Plan     ***  No follow-ups on file.      {provider attestation***:1}   Wilhemena Durie, MD  [facility] [phone] (phone) [phone] (fax)  [place]

## 2020-12-11 NOTE — Plan of Care (Signed)

## 2020-12-11 NOTE — Plan of Care (Signed)
Patient discharged back to facility and is being transported by [facility]. Telemetry box removed, IV removed, new left hip hip dressing applied, Vitals gather prior to leaving. Report called and given to receiving nurse AYO at facility. Problem: Clinical Measurements: Goal: Ability to maintain clinical measurements within normal limits will improve Outcome: Adequate for Discharge Goal: Will remain free from infection Outcome: Adequate for Discharge Goal: Diagnostic test results will improve Outcome: Adequate for Discharge Goal: Respiratory complications will improve Outcome: Adequate for Discharge Goal: Cardiovascular complication will be avoided Outcome: Adequate for Discharge   Problem: Activity: Goal: Risk for activity intolerance will decrease Outcome: Adequate for Discharge   Problem: Nutrition: Goal: Adequate nutrition will be maintained Outcome: Adequate for Discharge   Problem: Coping: Goal: Level of anxiety will decrease Outcome: Adequate for Discharge   Problem: Elimination: Goal: Will not experience complications related to bowel motility Outcome: Adequate for Discharge Goal: Will not experience complications related to urinary retention Outcome: Adequate for Discharge   Problem: Pain Managment: Goal: General experience of comfort will improve Outcome: Adequate for Discharge   Problem: Safety: Goal: Ability to remain free from injury will improve Outcome: Adequate for Discharge   Problem: Skin Integrity: Goal: Risk for impaired skin integrity will decrease Outcome: Adequate for Discharge

## 2020-12-11 NOTE — TOC Progression Note (Signed)
Transition of Care [facility]) - Progression Note    Patient Details  Name: Latasha Anderson MRN: [id number] Date of Birth: 1931/06/17  Transition of Care [facility]) CM/SW Contact  Purcell Mouton, RN Phone Number: 12/11/2020, 12:42 PM  Clinical Narrative:    Spoke with pt's daughter concerning pt returning to [facility]. Daughter Latasha Anderson is concerned because pt fell at [facility] that they are not watching pt enough. Latasha Anderson also wanted Bedside rails at facility. Encouraged Latasha Anderson to talk to [facility] about her concerns.    Expected Discharge Plan: [place] Barriers to Discharge: No Barriers Identified  Expected Discharge Plan and Services Expected Discharge Plan: [place] arrangements for the past 2 months: [place] Expected Discharge Date: 12/11/20                                     Social Determinants of Health (SDOH) Interventions    Readmission Risk Interventions No flowsheet data found.

## 2020-12-12 ENCOUNTER — Other Ambulatory Visit: Payer: Self-pay | Admitting: Cardiovascular Disease

## 2020-12-14 ENCOUNTER — Inpatient Hospital Stay: Payer: Medicare PPO | Admitting: Family Medicine

## 2020-12-14 NOTE — Telephone Encounter (Signed)
Refill Request.  

## 2020-12-14 NOTE — Telephone Encounter (Signed)
No warfarin on med list but looks like the pt has been consistently seeing Latasha Anderson will route to pharmd pool

## 2020-12-28 ENCOUNTER — Encounter: Payer: Self-pay | Admitting: Orthopedic Surgery

## 2020-12-28 ENCOUNTER — Ambulatory Visit (INDEPENDENT_AMBULATORY_CARE_PROVIDER_SITE_OTHER): Payer: Medicare PPO | Admitting: Orthopedic Surgery

## 2020-12-28 ENCOUNTER — Other Ambulatory Visit: Payer: Self-pay

## 2020-12-28 ENCOUNTER — Ambulatory Visit (INDEPENDENT_AMBULATORY_CARE_PROVIDER_SITE_OTHER): Payer: Medicare PPO

## 2020-12-28 DIAGNOSIS — S72002D Fracture of unspecified part of neck of left femur, subsequent encounter for closed fracture with routine healing: Secondary | ICD-10-CM

## 2020-12-28 NOTE — Progress Notes (Signed)
Post-Op Visit Note   Patient: Latasha Anderson           Date of Birth: 12-Nov-1930           MRN: [id number] Visit Date: 12/28/2020 PCP: Jerrol Banana., MD   Assessment & Plan:  Chief Complaint:  Chief Complaint  Patient presents with   Other    12/07/20 left hip hemi arthroplasty   Visit Diagnoses:  1. Closed fracture of left hip with routine healing, subsequent encounter     Plan: Orders the patient is now about 3 weeks out left hip hemiarthroplasty for fracture.  She is been doing reasonably well.  On exam she has no pain with range of motion of the left hip.  Pedal pulses intact but there is some pitting edema bilateral lower extremities.  Radiographs look good with equal leg lengths.  Weightbearing as tolerated.  Follow-up as needed  Follow-Up Instructions: No follow-ups on file.   Orders:  Orders Placed This Encounter  Procedures   XR HIP UNILAT W OR W/O PELVIS 2-3 VIEWS LEFT   No orders of the defined types were placed in this encounter.   Imaging: No results found.  PMFS History: Patient Active Problem List   Diagnosis Date Noted   Hip fracture ([place]) 12/06/2020   Generalized weakness 11/27/2020   Low back pain 11/27/2020   Frequent falls 11/27/2020   (HFpEF) heart failure with preserved ejection fraction (HCC)    Chronic atrial fibrillation ([place]) 05/01/2015   Atrophic vaginitis 12/15/2014   Breast CA ([place]) 12/15/2014   Decrease in the ability to hear 12/15/2014   Essential (primary) hypertension 12/15/2014   Fatigue 12/15/2014   Acid reflux 12/15/2014   Glaucoma 12/15/2014   Flu vaccine need 12/15/2014   Blood glucose elevated 12/15/2014   Arthritis, degenerative 12/15/2014   OP (osteoporosis) 12/15/2014   HLD (hyperlipidemia) 12/15/2014   Pain of upper abdomen 12/15/2014   Encounter for therapeutic drug monitoring 09/04/2013   Preop cardiovascular exam 03/07/2013   Hypokalemia    Long term (current) use of anticoagulants 08/22/2012    Atrial fibrillation ([place])    Dyslipidemia    Hypertension    Breast cancer ([place])    Shortness of breath    Chest pain    Ejection fraction    Mitral regurgitation    SHINGLES 07/26/2010   Past Medical History:  Diagnosis Date   (HFpEF) heart failure with preserved ejection fraction ([place])    a. EF 55-65%, January, 2012, question diastolic dysfunction; b. 57/2620 Echo: EF 55-60%, no rwma. Mild MR. Mildly dil LA. Nl RV fxn. PASP 67mmHg.   Breast cancer ([place]) 1985   RT MASTECTOMY   Chest pain    Dyslipidemia    Hypertension    Hypokalemia    February, 2014, potassium started   Mitral regurgitation    Mild, echo, 2012   Permanent atrial fibrillation ([place])    a. Dx 08/2012. CHA2DSD2VASc = 4-5-->warfarin.   Shingles    Shortness of breath    January, 3559, normal systolic function, question diastolic dysfunction    Family History  Problem Relation Age of Onset   Stroke Mother    Diabetes Father    Cancer Brother        prostate   Breast cancer Neg Hx     Past Surgical History:  Procedure Laterality Date   ABDOMINAL HYSTERECTOMY     age 28   ANTERIOR APPROACH HEMI HIP ARTHROPLASTY Left 12/07/2020   Procedure: ANTERIOR  APPROACH HEMI HIP ARTHROPLASTY;  Surgeon: Meredith Pel, MD;  Location: [facility];  Service: Orthopedics;  Laterality: Left;   AUGMENTATION MAMMAPLASTY Right 1985   RT MASTECTOMY FOR BREAST CA   EYE SURGERY     cataract   MASTECTOMY Right 1985   reconstruction inplant as well   Social History   Occupational History   Not on file  Tobacco Use   Smoking status: Never   Smokeless tobacco: Never  Vaping Use   Vaping Use: Never used  Substance and Sexual Activity   Alcohol use: No    Alcohol/week: 0.0 standard drinks   Drug use: No   Sexual activity: Never

## 2021-01-05 ENCOUNTER — Non-Acute Institutional Stay: Payer: Self-pay | Admitting: Adult Health Nurse Practitioner

## 2021-01-05 ENCOUNTER — Encounter: Payer: Self-pay | Admitting: Adult Health Nurse Practitioner

## 2021-01-05 ENCOUNTER — Other Ambulatory Visit: Payer: Self-pay

## 2021-01-05 VITALS — HR 87 | Wt 128.0 lb

## 2021-01-05 DIAGNOSIS — I482 Chronic atrial fibrillation, unspecified: Secondary | ICD-10-CM

## 2021-01-05 DIAGNOSIS — R5381 Other malaise: Secondary | ICD-10-CM

## 2021-01-05 DIAGNOSIS — Z515 Encounter for palliative care: Secondary | ICD-10-CM

## 2021-01-05 DIAGNOSIS — F0391 Unspecified dementia with behavioral disturbance: Secondary | ICD-10-CM

## 2021-01-05 DIAGNOSIS — S72002A Fracture of unspecified part of neck of left femur, initial encounter for closed fracture: Secondary | ICD-10-CM

## 2021-01-05 NOTE — Progress Notes (Signed)
[facility] Consult Note Telephone: [phone]  Fax: [phone]   Date of encounter: 01/05/21 PATIENT NAME: Latasha Anderson [id number]   [phone] (home)  DOB: 05-16-1931 MRN: [id number] PRIMARY CARE PROVIDER:    Dr. Ronalee Belts NP  REFERRING PROVIDER:   Humberto Leep NP  RESPONSIBLE PARTY:    Contact Information     Name Relation Home Work Mobile   Dolin,Cecil Spouse [phone]     Soutar,Cindy Daughter   [phone]        I met face to face with patient and family in facility. Palliative Care was asked to follow this patient by consultation request of  Humberto Leep NP to address advance care planning and complex medical decision making. This is the initial visit.   Husband present during visit today                                    ASSESSMENT AND PLAN / RECOMMENDATIONS:   Advance Care Planning/Goals of Care: Goals include to maximize quality of life and symptom management. CODE STATUS: DNR  Symptom Management/Plan:  Dementia: Prior to hospitalizations patient was having some cognitive decline and forgetfulness.  Now she is sleeping more and though able to answer some questions she is not answering some questions and not following all commands.    Left femur fracture/debility: After left femur fracture with hip replacement patient did receive PT/OT services and is no longer under the skilled services.  Patient is now bed/chair bound requiring total care except with feeding.  This seems to be her new baseline.  Husband made comments today indicating that he realizes he will not be able to take care of her at home.  Did have discussion about 24/7 care at home versus staying is long-term care resident of facility.  Chronic A. fib: Has recently been taken off of her warfarin due to widespread bruising and frequent falls.  Rate controlled with diltiazem 60 mg twice daily.   Continue to monitor at facility  CHF: Patient did not require diuretics in the hospital and her Lasix was held upon discharge due to hyponatremia.  She is having edema noted to bilateral lower extremities with more in the left than the right.  No crackles heard in lungs with no reported cough or shortness of breath.  Continue to monitor at facility  Did have discussion with husband about hospice given her decline.  Did also discuss prognosis and though she has had a significant decline over the past couple of months it may be hard to determine if she has 6 months or less given that she is still taking in 2 Ensures a day. Have reached out to hospice physicians and feel like at this time she should remain under palliative services and watched closely for any further decline that might warrant hospice services in the future.  Husband states that he does not carry a cell phone and he is at the facility most of the day with his wife.  Will call daughter to update on visit.   Follow up Palliative Care Visit: Palliative care will continue to follow for complex medical decision making, advance care planning, and clarification of goals. Return 4-6 weeks or prn.  Has been encouraged to call with any questions or concerns  I spent 50 minutes providing this consultation. More than 50% of the time in  this consultation was spent in counseling and care coordination.   PPS: 30%  HOSPICE ELIGIBILITY/DIAGNOSIS: TBD  Chief Complaint: Initial palliative visit  HISTORY OF PRESENT ILLNESS:  Latasha Anderson is a 87 y.o. year old female  with dementia, a-fib, glaucoma, HTN, dCHF.  Patient unable to contribute to HPI/ROS secondary to dementia.  Patient had hospitalization 5/27 through 12/02/2020 for a fall.  X-rays did show compression fracture of T12-L1 and LSO brace was recommended.  Patient was transferred to SNF for short-term rehab.  While in SNF she had fall resulting in left femur fracture requiring left hip  replacement.  CT of the brain at this time showed no acute abnormalities.  Patient has been reevaluated by Ortho on 12/28/2020 and is now weightbearing as tolerated and follow-up as needed.  During hospitalization patient was having urinary retention and Foley catheter was placed.  Husband states that prior to her falls patient was ambulatory and independent of ADLs.  Though she was having some cognitive decline.  Now she is bed/chair bound requiring total care except with feeding.  Husband has not noticed any difficulties with her swallowing.  Husband does state that she probably sleeps 18 to 19 hours/day.  Today during visit she does not open her eyes but will respond to some questions and does easily doze off.  Husband states that she will only eat bites of her meals but it does seem as if she is getting to ensures a day that she does drink.  Patient does not appear to be in pain.    History obtained from review of EMR, discussion with primary team, and interview with family, facility staff and Ms. Homeyer.  I reviewed available labs, medications, imaging, studies and related documents from the EMR.  Records reviewed and summarized above.   Physical Exam:  Constitutional: NAD General: frail appearing EYES: lids intact, no discharge  ENMT: intact hearing, oral mucous membranes moist CV: S1S2, RRR, 2+ LE edema with more on left than right Pulmonary: LCTA, no increased work of breathing, no cough Abdomen:  normo-active BS + 4 quadrants, soft and non tender GU: Patient has Foley catheter in place with clear yellow urine noted in bag and tubing MSK: non ambulatory; requires total care Skin: warm and dry, no rashes on visible skin Neuro:   A and O to person only Hem/lymph/immuno: no widespread bruising  CURRENT PROBLEM LIST:  Patient Active Problem List   Diagnosis Date Noted   Hip fracture ([place]) 12/06/2020   Generalized weakness 11/27/2020   Low back pain 11/27/2020   Frequent falls 11/27/2020    (HFpEF) heart failure with preserved ejection fraction (HCC)    Chronic atrial fibrillation (HCC) 05/01/2015   Atrophic vaginitis 12/15/2014   Breast CA ([place]) 12/15/2014   Decrease in the ability to hear 12/15/2014   Essential (primary) hypertension 12/15/2014   Fatigue 12/15/2014   Acid reflux 12/15/2014   Glaucoma 12/15/2014   Flu vaccine need 12/15/2014   Blood glucose elevated 12/15/2014   Arthritis, degenerative 12/15/2014   OP (osteoporosis) 12/15/2014   HLD (hyperlipidemia) 12/15/2014   Pain of upper abdomen 12/15/2014   Encounter for therapeutic drug monitoring 09/04/2013   Preop cardiovascular exam 03/07/2013   Hypokalemia    Long term (current) use of anticoagulants 08/22/2012   Atrial fibrillation (HCC)    Dyslipidemia    Hypertension    Breast cancer (HCC)    Shortness of breath    Chest pain    Ejection fraction    Mitral  regurgitation    SHINGLES 07/26/2010   PAST MEDICAL HISTORY:  Active Ambulatory Problems    Diagnosis Date Noted   SHINGLES 07/26/2010   Dyslipidemia    Hypertension    Breast cancer (HCC)    Shortness of breath    Chest pain    Ejection fraction    Mitral regurgitation    Atrial fibrillation ([place])    Long term (current) use of anticoagulants 08/22/2012   Hypokalemia    Preop cardiovascular exam 03/07/2013   Encounter for therapeutic drug monitoring 09/04/2013   Atrophic vaginitis 12/15/2014   Breast CA ([place]) 12/15/2014   Decrease in the ability to hear 12/15/2014   Essential (primary) hypertension 12/15/2014   Fatigue 12/15/2014   Acid reflux 12/15/2014   Glaucoma 12/15/2014   Flu vaccine need 12/15/2014   Blood glucose elevated 12/15/2014   Arthritis, degenerative 12/15/2014   OP (osteoporosis) 12/15/2014   HLD (hyperlipidemia) 12/15/2014   Pain of upper abdomen 12/15/2014   Chronic atrial fibrillation ([place]) 05/01/2015   Generalized weakness 11/27/2020   Low back pain 11/27/2020   Frequent falls 11/27/2020   (HFpEF)  heart failure with preserved ejection fraction (HCC)    Hip fracture ([place]) 12/06/2020   Resolved Ambulatory Problems    Diagnosis Date Noted   No Resolved Ambulatory Problems   Past Medical History:  Diagnosis Date   Permanent atrial fibrillation (HCC)    Shingles    SOCIAL HX:  Social History   Tobacco Use   Smoking status: Never   Smokeless tobacco: Never  Substance Use Topics   Alcohol use: No    Alcohol/week: 0.0 standard drinks   FAMILY HX:  Family History  Problem Relation Age of Onset   Stroke Mother    Diabetes Father    Cancer Brother        prostate   Breast cancer Neg Hx       ALLERGIES:  Allergies  Allergen Reactions   Ciprofloxacin Nausea And Vomiting   Cortisone Other (See Comments)    Pt stated this makes her turn blue   Nitrofurantoin Monohyd Macro Other (See Comments)    GI upset & chills.   Other Other (See Comments)    Mycins - unknown reaction     PERTINENT MEDICATIONS:  Outpatient Encounter Medications as of 01/05/2021  Medication Sig   acetaminophen (TYLENOL) 325 MG tablet Take 1-2 tablets (325-650 mg total) by mouth every 6 (six) hours as needed for mild pain (pain score 1-3 or temp > 100.5).   alendronate (FOSAMAX) 70 MG tablet TAKE 1 TABLET (70 MG TOTAL) BY MOUTH EVERY 7 (SEVEN) DAYS. TAKE WITH A FULL GLASS OF WATER ON AN EMPTY STOMACH.   aspirin EC 325 MG EC tablet Take 1 tablet (325 mg total) by mouth daily with breakfast.   atorvastatin (LIPITOR) 10 MG tablet Take 1 tablet (10 mg total) by mouth at bedtime.   cholecalciferol (VITAMIN D3) 25 MCG (1000 UNIT) tablet Take 1,000 Units by mouth daily at 12 noon.   diltiazem (CARDIZEM SR) 60 MG 12 hr capsule Take 60 mg by mouth every 12 (twelve) hours. Hold if BP <110/70   docusate sodium (COLACE) 100 MG capsule Take 1 capsule (100 mg total) by mouth 2 (two) times daily.   feeding supplement (ENSURE SURGERY) LIQD Take 237 mLs by mouth 2 (two) times daily between meals.   ferrous sulfate 325 (65  FE) MG tablet Take 1 tablet (325 mg total) by mouth 3 (three) times daily after meals.  HYDROcodone-acetaminophen (NORCO/VICODIN) 5-325 MG tablet Take 1 tablet by mouth every 6 (six) hours as needed for severe pain (pain score 4-6).   latanoprost (XALATAN) 0.005 % ophthalmic solution Place 1 drop into both eyes daily at 12 noon.   methocarbamol (ROBAXIN) 500 MG tablet Take 1 tablet (500 mg total) by mouth every 6 (six) hours as needed for muscle spasms.   Multiple Vitamin (MULTIVITAMIN WITH MINERALS) TABS tablet Take 1 tablet by mouth daily.   polyethylene glycol (MIRALAX / GLYCOLAX) 17 g packet Take 17 g by mouth daily as needed for mild constipation.   QUEtiapine (SEROQUEL) 25 MG tablet Take 1 tablet (25 mg total) by mouth at bedtime.   senna (SENOKOT) 8.6 MG TABS tablet Take 1 tablet (8.6 mg total) by mouth 2 (two) times daily.   timolol (TIMOPTIC) 0.5 % ophthalmic solution Place 1 drop into both eyes daily at 12 noon.   No facility-administered encounter medications on file as of 01/05/2021.   Thank you for the opportunity to participate in the care of Ms. Harrower.  The palliative care team will continue to follow. Please call our office at [phone] if we can be of additional assistance.   Melburn Treiber Jenetta Downer, NP   COVID-19 PATIENT SCREENING TOOL Asked and negative response unless otherwise noted:  Have you had symptoms of covid, tested positive or been in contact with someone with symptoms/positive test in the past 5-10 days?

## 2021-01-06 ENCOUNTER — Telehealth: Payer: Self-pay | Admitting: Adult Health Nurse Practitioner

## 2021-01-06 NOTE — Telephone Encounter (Signed)
Called daughter to update on yesterday's visit.  Left VM with reason for call and call back info Hadar Elgersma K. Olena Heckle NP

## 2021-01-06 NOTE — Telephone Encounter (Signed)
Spoke with daughter and updated on yesterday's visit.  Discussed that per discussion with hospice physicians she is not eligible for hospice services at this time.  Answered her questions about palliative services.  She is encouraged to call with any questions or concerns Areli Jowett K. Olena Heckle, NP

## 2021-01-09 ENCOUNTER — Emergency Department: Payer: Medicare PPO

## 2021-01-09 ENCOUNTER — Emergency Department
Admission: EM | Admit: 2021-01-09 | Discharge: 2021-01-09 | Disposition: A | Discharge status: Home / Self Care | Payer: Medicare PPO | Attending: Emergency Medicine | Admitting: Emergency Medicine

## 2021-01-09 ENCOUNTER — Other Ambulatory Visit: Payer: Self-pay

## 2021-01-09 DIAGNOSIS — I4891 Unspecified atrial fibrillation: Secondary | ICD-10-CM | POA: Insufficient documentation

## 2021-01-09 DIAGNOSIS — W1839XA Other fall on same level, initial encounter: Secondary | ICD-10-CM | POA: Diagnosis not present

## 2021-01-09 DIAGNOSIS — Z7901 Long term (current) use of anticoagulants: Secondary | ICD-10-CM | POA: Insufficient documentation

## 2021-01-09 DIAGNOSIS — Z853 Personal history of malignant neoplasm of breast: Secondary | ICD-10-CM | POA: Diagnosis not present

## 2021-01-09 DIAGNOSIS — S0990XA Unspecified injury of head, initial encounter: Secondary | ICD-10-CM | POA: Diagnosis present

## 2021-01-09 DIAGNOSIS — E876 Hypokalemia: Secondary | ICD-10-CM | POA: Diagnosis not present

## 2021-01-09 DIAGNOSIS — Z7982 Long term (current) use of aspirin: Secondary | ICD-10-CM | POA: Insufficient documentation

## 2021-01-09 DIAGNOSIS — N390 Urinary tract infection, site not specified: Secondary | ICD-10-CM

## 2021-01-09 DIAGNOSIS — Z96642 Presence of left artificial hip joint: Secondary | ICD-10-CM | POA: Diagnosis not present

## 2021-01-09 DIAGNOSIS — S51012A Laceration without foreign body of left elbow, initial encounter: Secondary | ICD-10-CM | POA: Insufficient documentation

## 2021-01-09 DIAGNOSIS — I10 Essential (primary) hypertension: Secondary | ICD-10-CM | POA: Insufficient documentation

## 2021-01-09 DIAGNOSIS — Z79899 Other long term (current) drug therapy: Secondary | ICD-10-CM | POA: Insufficient documentation

## 2021-01-09 DIAGNOSIS — S0003XA Contusion of scalp, initial encounter: Secondary | ICD-10-CM | POA: Diagnosis not present

## 2021-01-09 DIAGNOSIS — W19XXXA Unspecified fall, initial encounter: Secondary | ICD-10-CM

## 2021-01-09 LAB — CBC WITH DIFFERENTIAL/PLATELET
Abs Immature Granulocytes: 0.05 10*3/uL (ref 0.00–0.07)
Basophils Absolute: 0 10*3/uL (ref 0.0–0.1)
Basophils Relative: 0 %
Eosinophils Absolute: 0 10*3/uL (ref 0.0–0.5)
Eosinophils Relative: 0 %
HCT: 42.4 % (ref 36.0–46.0)
Hemoglobin: 14.1 g/dL (ref 12.0–15.0)
Immature Granulocytes: 1 %
Lymphocytes Relative: 13 %
Lymphs Abs: 1.3 10*3/uL (ref 0.7–4.0)
MCH: 30.9 pg (ref 26.0–34.0)
MCHC: 33.3 g/dL (ref 30.0–36.0)
MCV: 93 fL (ref 80.0–100.0)
Monocytes Absolute: 0.6 10*3/uL (ref 0.1–1.0)
Monocytes Relative: 6 %
Neutro Abs: 7.7 10*3/uL (ref 1.7–7.7)
Neutrophils Relative %: 80 %
Platelets: 436 10*3/uL — ABNORMAL HIGH (ref 150–400)
RBC: 4.56 MIL/uL (ref 3.87–5.11)
RDW: 14.2 % (ref 11.5–15.5)
WBC: 9.6 10*3/uL (ref 4.0–10.5)
nRBC: 0 % (ref 0.0–0.2)

## 2021-01-09 LAB — URINALYSIS, COMPLETE (UACMP) WITH MICROSCOPIC
Bilirubin Urine: NEGATIVE
Glucose, UA: NEGATIVE mg/dL
Hgb urine dipstick: NEGATIVE
Ketones, ur: NEGATIVE mg/dL
Nitrite: POSITIVE — AB
Protein, ur: NEGATIVE mg/dL
Specific Gravity, Urine: 1.006 (ref 1.005–1.030)
pH: 8 (ref 5.0–8.0)

## 2021-01-09 LAB — BASIC METABOLIC PANEL
Anion gap: 10 (ref 5–15)
BUN: 7 mg/dL — ABNORMAL LOW (ref 8–23)
CO2: 28 mmol/L (ref 22–32)
Calcium: 8.7 mg/dL — ABNORMAL LOW (ref 8.9–10.3)
Chloride: 95 mmol/L — ABNORMAL LOW (ref 98–111)
Creatinine, Ser: 0.42 mg/dL — ABNORMAL LOW (ref 0.44–1.00)
GFR, Estimated: >60 mL/min (ref >60)
Glucose, Bld: 120 mg/dL — ABNORMAL HIGH (ref 70–99)
Potassium: 3 mmol/L — ABNORMAL LOW (ref 3.5–5.1)
Sodium: 133 mmol/L — ABNORMAL LOW (ref 135–145)

## 2021-01-09 MED ORDER — CEPHALEXIN 500 MG PO CAPS: 500.0000 mg | ORAL_CAPSULE | Freq: Four times a day (QID) | ORAL | 0 refills | Status: AC

## 2021-01-09 MED ORDER — POTASSIUM CHLORIDE CRYS ER 20 MEQ PO TBCR
40.0000 meq | EXTENDED_RELEASE_TABLET | Freq: Once | ORAL | Status: AC
  Administered 2021-01-09: 40 meq via ORAL
  Filled 2021-01-09: qty 2

## 2021-01-09 NOTE — ED Notes (Signed)
Pt with abrasion to left elbow, clean dressing applied. Pt with old bruising to left wrist, mobility intact. Pt with broken blister to back of left leg, tight dressing removed at this time.

## 2021-01-09 NOTE — ED Notes (Signed)
Diaper changed, repositioned patient.

## 2021-01-09 NOTE — Discharge Instructions (Addendum)
Please seek medical attention for any high fevers, chest pain, shortness of breath, change in behavior, persistent vomiting, bloody stool or any other new or concerning symptoms.  

## 2021-01-09 NOTE — ED Provider Notes (Signed)
[facility]  ____________________________________________   I have reviewed the triage vital signs and the nursing notes.   HISTORY  Chief Complaint Fall   History limited by: Not Limited   HPI Latasha Anderson is a 87 y.o. female who presents to the emergency department today because of concern for a fall. The patient his history of recent hip surgery and is currently at care facility. Had a foley catheter in but fell off the side of the bed opposite the side the foley catheter bag was in. Did pull the foley catheter out. Per husband the patient continues to not be ambulatory. Patient did suffer a hematoma to her head. However at the time of my exam is not complaining of any pain.     Records reviewed. Per medical record review patient has a history of recent hip fracture s/p surgery.  Past Medical History:  Diagnosis Date   (HFpEF) heart failure with preserved ejection fraction ([place])    a. EF 55-65%, January, 2012, question diastolic dysfunction; b. 83/4196 Echo: EF 55-60%, no rwma. Mild MR. Mildly dil LA. Nl RV fxn. PASP 42mmHg.   Breast cancer ([place]) 1985   RT MASTECTOMY   Chest pain    Dyslipidemia    Hypertension    Hypokalemia    February, 2014, potassium started   Mitral regurgitation    Mild, echo, 2012   Permanent atrial fibrillation ([place])    a. Dx 08/2012. CHA2DSD2VASc = 4-5-->warfarin.   Shingles    Shortness of breath    January, 2229, normal systolic function, question diastolic dysfunction    Patient Active Problem List   Diagnosis Date Noted   Hip fracture ([place]) 12/06/2020   Generalized weakness 11/27/2020   Low back pain 11/27/2020   Frequent falls 11/27/2020   (HFpEF) heart failure with preserved ejection fraction (HCC)    Chronic atrial fibrillation ([place]) 05/01/2015   Atrophic vaginitis 12/15/2014   Breast CA ([place]) 12/15/2014   Decrease in the ability to hear 12/15/2014   Essential (primary) hypertension 12/15/2014    Fatigue 12/15/2014   Acid reflux 12/15/2014   Glaucoma 12/15/2014   Flu vaccine need 12/15/2014   Blood glucose elevated 12/15/2014   Arthritis, degenerative 12/15/2014   OP (osteoporosis) 12/15/2014   HLD (hyperlipidemia) 12/15/2014   Pain of upper abdomen 12/15/2014   Encounter for therapeutic drug monitoring 09/04/2013   Preop cardiovascular exam 03/07/2013   Hypokalemia    Long term (current) use of anticoagulants 08/22/2012   Atrial fibrillation (HCC)    Dyslipidemia    Hypertension    Breast cancer ([place])    Shortness of breath    Chest pain    Ejection fraction    Mitral regurgitation    SHINGLES 07/26/2010    Past Surgical History:  Procedure Laterality Date   ABDOMINAL HYSTERECTOMY     age 49   ANTERIOR APPROACH HEMI HIP ARTHROPLASTY Left 12/07/2020   Procedure: ANTERIOR APPROACH HEMI HIP ARTHROPLASTY;  Surgeon: Meredith Pel, MD;  Location: [facility] ORS;  Service: Orthopedics;  Laterality: Left;   AUGMENTATION MAMMAPLASTY Right 1985   RT MASTECTOMY FOR BREAST CA   EYE SURGERY     cataract   MASTECTOMY Right 1985   reconstruction inplant as well    Prior to Admission medications   Medication Sig Start Date End Date Taking? Authorizing Provider  acetaminophen (TYLENOL) 325 MG tablet Take 1-2 tablets (325-650 mg total) by mouth every 6 (six) hours as needed for mild pain (pain score 1-3  or temp > 100.5). 12/11/20   Meredith Pel, MD  alendronate (FOSAMAX) 70 MG tablet TAKE 1 TABLET (70 MG TOTAL) BY MOUTH EVERY 7 (SEVEN) DAYS. TAKE WITH A FULL GLASS OF WATER ON AN EMPTY STOMACH. 10/22/20   Jerrol Banana., MD  aspirin EC 325 MG EC tablet Take 1 tablet (325 mg total) by mouth daily with breakfast. 12/11/20   Meredith Pel, MD  atorvastatin (LIPITOR) 10 MG tablet Take 1 tablet (10 mg total) by mouth at bedtime. 12/11/20   Bonnielee Haff, MD  cholecalciferol (VITAMIN D3) 25 MCG (1000 UNIT) tablet Take 1,000 Units by mouth daily at 12 noon.    [provider]  diltiazem (CARDIZEM SR) 60 MG 12 hr capsule Take 60 mg by mouth every 12 (twelve) hours. Hold if BP <110/70    [provider]  docusate sodium (COLACE) 100 MG capsule Take 1 capsule (100 mg total) by mouth 2 (two) times daily. 12/11/20   Bonnielee Haff, MD  feeding supplement (ENSURE SURGERY) LIQD Take 237 mLs by mouth 2 (two) times daily between meals. 12/11/20   Bonnielee Haff, MD  ferrous sulfate 325 (65 FE) MG tablet Take 1 tablet (325 mg total) by mouth 3 (three) times daily after meals. 12/11/20   Bonnielee Haff, MD  HYDROcodone-acetaminophen (NORCO/VICODIN) 5-325 MG tablet Take 1 tablet by mouth every 6 (six) hours as needed for severe pain (pain score 4-6). 12/11/20   Bonnielee Haff, MD  latanoprost (XALATAN) 0.005 % ophthalmic solution Place 1 drop into both eyes daily at 12 noon. 08/01/12   [provider]  methocarbamol (ROBAXIN) 500 MG tablet Take 1 tablet (500 mg total) by mouth every 6 (six) hours as needed for muscle spasms. 12/11/20   Bonnielee Haff, MD  Multiple Vitamin (MULTIVITAMIN WITH MINERALS) TABS tablet Take 1 tablet by mouth daily. 12/12/20   Bonnielee Haff, MD  polyethylene glycol (MIRALAX / GLYCOLAX) 17 g packet Take 17 g by mouth daily as needed for mild constipation. 12/11/20   Bonnielee Haff, MD  QUEtiapine (SEROQUEL) 25 MG tablet Take 1 tablet (25 mg total) by mouth at bedtime. 12/02/20   Fritzi Mandes, MD  senna (SENOKOT) 8.6 MG TABS tablet Take 1 tablet (8.6 mg total) by mouth 2 (two) times daily. 12/11/20   Bonnielee Haff, MD  timolol (TIMOPTIC) 0.5 % ophthalmic solution Place 1 drop into both eyes daily at 12 noon. 08/05/12   [provider]    Allergies Ciprofloxacin, Cortisone, Nitrofurantoin monohyd macro, and Other  Family History  Problem Relation Age of Onset   Stroke Mother    Diabetes Father    Cancer Brother        prostate   Breast cancer Neg Hx     Social History Social History   Tobacco Use   Smoking  status: Never   Smokeless tobacco: Never  Vaping Use   Vaping Use: Never used  Substance Use Topics   Alcohol use: No    Alcohol/week: 0.0 standard drinks   Drug use: No    Review of Systems Constitutional: No fever/chills Eyes: No visual changes. ENT: No sore throat. Cardiovascular: Denies chest pain. Respiratory: Denies shortness of breath. Gastrointestinal: No abdominal pain.  No nausea, no vomiting.  No diarrhea.   Genitourinary: Negative for dysuria. Musculoskeletal: Negative for back pain. Skin: Positive for skin tear to left elbow. Neurological: Negative for headaches, focal weakness or numbness.  ____________________________________________   PHYSICAL EXAM:  VITAL SIGNS: ED Triage Vitals  Enc Vitals Group     BP 01/09/21 1112 (!) 154/92     Pulse Rate 01/09/21 1112 92     Resp 01/09/21 1112 16     Temp 01/09/21 1112 98.8 F (37.1 C)     Temp Source 01/09/21 1112 Oral     SpO2 01/09/21 1112 95 %     Weight 01/09/21 1114 145 lb (65.8 kg)     Height 01/09/21 1114 5\' 6"  (1.676 m)   Constitutional: Alert and oriented.  Eyes: Conjunctivae are normal.  ENT      Head: Normocephalic. Small hematoma to occiput      Nose: No congestion/rhinnorhea.      Mouth/Throat: Mucous membranes are moist.      Neck: No stridor. Hematological/Lymphatic/Immunilogical: No cervical lymphadenopathy. Cardiovascular: Normal rate, regular rhythm.  No murmurs, rubs, or gallops.  Respiratory: Normal respiratory effort without tachypnea nor retractions. Breath sounds are clear and equal bilaterally. No wheezes/rales/rhonchi. Gastrointestinal: Soft and non tender. No rebound. No guarding.  Genitourinary: Deferred Musculoskeletal: Normal range of motion in all extremities. Left lower extremity edema.  Neurologic:  Not completely oriented. Moving all extremities.  Skin:  Skin tear to left elbow.   ____________________________________________    LABS (pertinent  positives/negatives)  BMP na 133, k 3.0, glu 120, cr 0.42 UA hazy, positive nitrite, 21-50 WBC CBC wbc 9.6, hgb 14.1, plt 436  ____________________________________________   EKG  I, Nance Pear, attending physician, personally viewed and interpreted this EKG  EKG Time: 1207 Rate: 101 Rhythm: atrial fibrillation Axis: right axis deviation Intervals: qtc 418 QRS: narrow ST changes: no st elevation Impression: abnormal ekg   ____________________________________________    RADIOLOGY  CT head/cervical spine No acute traumatic findings  ____________________________________________   PROCEDURES  Procedures  ____________________________________________   INITIAL IMPRESSION / ASSESSMENT AND PLAN / ED COURSE  Pertinent labs & imaging results that were available during my care of the patient were reviewed by me and considered in my medical decision making (see chart for details).   Patient presented to the emergency department today after an unwitnessed fall. CT head/cervical spine without acute abnormality. Blood work showed slight hypokalemia. UA is concerning for infection. Will start antibiotics and send for culture. Patient did pull out her foley so will replace given that husband states she is still not ambulatory post surgery.   ____________________________________________   FINAL CLINICAL IMPRESSION(S) / ED DIAGNOSES  Final diagnoses:  Fall, initial encounter  Lower urinary tract infectious disease     Note: This dictation was prepared with Diplomatic Services operational officer dictation. Any transcriptional errors that result from this process are unintentional     Nance Pear, MD 01/09/21 1349

## 2021-01-09 NOTE — ED Triage Notes (Signed)
Pt from [facility], pt with unwitnessed fall this am. Pt with new  hematoma to back of head, and hx of L hip and L femur fracture repair.  Pt had foley per EMS, but foley pulled out per SNF and all parts of foley were noted, balloon was still inflated and contained fluid. Pt with left leg wrapped in coban, left foot swollen. Pt oriented to self only.

## 2021-01-09 NOTE — ED Notes (Signed)
Patient kept removing pulse ox and touching EKG wires. VSS.

## 2021-01-10 ENCOUNTER — Other Ambulatory Visit: Payer: Self-pay | Admitting: Family Medicine

## 2021-01-10 NOTE — Telephone Encounter (Signed)
Requested medication (s) are due for refill today: yes  Requested medication (s) are on the active medication list: yes  Last refill:  12/11/20  Future visit scheduled: no  Notes to clinic:  overdue lab work   Requested Prescriptions  Pending Prescriptions Disp Refills   atorvastatin (LIPITOR) 10 MG tablet [Pharmacy Med Name: ATORVASTATIN 10 MG TABLET] 90 tablet     Sig: TAKE 1 TABLET BY MOUTH EVERY DAY. MUST HAVE APPT FOR REFILLS      Cardiovascular:  Antilipid - Statins Failed - 01/10/2021 12:48 AM      Failed - Total Cholesterol in normal range and within 360 days    Cholesterol, Total  Date Value Ref Range Status  03/07/2019 165 100 - 199 mg/dL Final          Failed - LDL in normal range and within 360 days    LDL Cholesterol (Calc)  Date Value Ref Range Status  06/19/2017 89 mg/dL (calc) Final    Comment:    Reference range: <100 . Desirable range <100 mg/dL for primary prevention;   <70 mg/dL for patients with CHD or diabetic patients  with > or = 2 CHD risk factors. Marland Kitchen LDL-C is now calculated using the Martin-Hopkins  calculation, which is a validated novel method providing  better accuracy than the Friedewald equation in the  estimation of LDL-C.  Cresenciano Genre et al. Annamaria Helling. 3825;053(97): 2061-2068  (http://education.[employer].com/faq/FAQ164)    LDL Chol Calc (NIH)  Date Value Ref Range Status  03/07/2019 84 0 - 99 mg/dL Final          Failed - HDL in normal range and within 360 days    HDL  Date Value Ref Range Status  03/07/2019 59 >39 mg/dL Final          Failed - Triglycerides in normal range and within 360 days    Triglycerides  Date Value Ref Range Status  03/07/2019 126 0 - 149 mg/dL Final          Passed - Patient is not pregnant      Passed - Valid encounter within last 12 months    Recent Outpatient Visits           10 months ago Alzheimer's disease of other onset with behavioral disturbance [facility])   [facility]  Jerrol Banana., MD   1 year ago Annual physical exam   [facility] Jerrol Banana., MD   2 years ago Encounter for annual physical exam   [facility] Jerrol Banana., MD   3 years ago Essential (primary) hypertension   [facility] Jerrol Banana., MD   4 years ago Permanent atrial fibrillation [facility])   [facility] Jerrol Banana., MD

## 2021-01-11 LAB — URINE CULTURE: Culture: 70000 — AB

## 2021-01-25 ENCOUNTER — Other Ambulatory Visit: Payer: Self-pay | Admitting: Family Medicine

## 2021-01-25 NOTE — Telephone Encounter (Signed)
  Notes to clinic:   REQUEST FOR 90 DAYS PRESCRIPTION.  Requested Prescriptions  Pending Prescriptions Disp Refills   atorvastatin (LIPITOR) 10 MG tablet [Pharmacy Med Name: ATORVASTATIN 10 MG TABLET] 90 tablet 1    Sig: Take 1 tablet (10 mg total) by mouth daily. Please schedule an office visit before anymore refills.      Cardiovascular:  Antilipid - Statins Failed - 01/25/2021  9:56 AM      Failed - Total Cholesterol in normal range and within 360 days    Cholesterol, Total  Date Value Ref Range Status  03/07/2019 165 100 - 199 mg/dL Final          Failed - LDL in normal range and within 360 days    LDL Cholesterol (Calc)  Date Value Ref Range Status  06/19/2017 89 mg/dL (calc) Final    Comment:    Reference range: <100 . Desirable range <100 mg/dL for primary prevention;   <70 mg/dL for patients with CHD or diabetic patients  with > or = 2 CHD risk factors. Marland Kitchen LDL-C is now calculated using the Martin-Hopkins  calculation, which is a validated novel method providing  better accuracy than the Friedewald equation in the  estimation of LDL-C.  Cresenciano Genre et al. Annamaria Helling. [id number]): 2061-2068  (http://education.[employer].com/faq/FAQ164)    LDL Chol Calc (NIH)  Date Value Ref Range Status  03/07/2019 84 0 - 99 mg/dL Final          Failed - HDL in normal range and within 360 days    HDL  Date Value Ref Range Status  03/07/2019 59 >39 mg/dL Final          Failed - Triglycerides in normal range and within 360 days    Triglycerides  Date Value Ref Range Status  03/07/2019 126 0 - 149 mg/dL Final          Passed - Patient is not pregnant      Passed - Valid encounter within last 12 months    Recent Outpatient Visits           10 months ago Alzheimer's disease of other onset with behavioral disturbance [facility])   [facility] Jerrol Banana., MD   1 year ago Annual physical exam   [facility] Jerrol Banana., MD   2  years ago Encounter for annual physical exam   [facility] Jerrol Banana., MD   3 years ago Essential (primary) hypertension   [facility] Jerrol Banana., MD   4 years ago Permanent atrial fibrillation [facility])   [facility] Jerrol Banana., MD

## 2021-01-25 NOTE — Telephone Encounter (Signed)
Ok to refill for 90days? Patient's LOV was 03/11/2020. Please advise. Thanks!

## 2021-02-01 ENCOUNTER — Other Ambulatory Visit: Payer: Self-pay

## 2021-02-01 ENCOUNTER — Emergency Department (HOSPITAL_COMMUNITY): Payer: Medicare PPO

## 2021-02-01 ENCOUNTER — Inpatient Hospital Stay (HOSPITAL_COMMUNITY): Payer: Medicare PPO

## 2021-02-01 ENCOUNTER — Encounter (HOSPITAL_COMMUNITY): Payer: Self-pay

## 2021-02-01 ENCOUNTER — Inpatient Hospital Stay (HOSPITAL_COMMUNITY)
Admission: EM | Admit: 2021-02-01 | Discharge: 2021-02-10 | DRG: 466 | Disposition: A | Discharge status: Skilled Nursing Facility | Payer: Medicare PPO | Attending: Internal Medicine | Admitting: Internal Medicine

## 2021-02-01 DIAGNOSIS — T148XXA Other injury of unspecified body region, initial encounter: Secondary | ICD-10-CM

## 2021-02-01 DIAGNOSIS — I110 Hypertensive heart disease with heart failure: Secondary | ICD-10-CM | POA: Diagnosis present

## 2021-02-01 DIAGNOSIS — N390 Urinary tract infection, site not specified: Secondary | ICD-10-CM | POA: Diagnosis present

## 2021-02-01 DIAGNOSIS — Z8781 Personal history of (healed) traumatic fracture: Secondary | ICD-10-CM

## 2021-02-01 DIAGNOSIS — T83511A Infection and inflammatory reaction due to indwelling urethral catheter, initial encounter: Secondary | ICD-10-CM | POA: Diagnosis present

## 2021-02-01 DIAGNOSIS — I4821 Permanent atrial fibrillation: Secondary | ICD-10-CM | POA: Diagnosis present

## 2021-02-01 DIAGNOSIS — Z66 Do not resuscitate: Secondary | ICD-10-CM | POA: Diagnosis present

## 2021-02-01 DIAGNOSIS — F0390 Unspecified dementia without behavioral disturbance: Secondary | ICD-10-CM | POA: Diagnosis present

## 2021-02-01 DIAGNOSIS — E43 Unspecified severe protein-calorie malnutrition: Secondary | ICD-10-CM | POA: Diagnosis present

## 2021-02-01 DIAGNOSIS — Z96649 Presence of unspecified artificial hip joint: Secondary | ICD-10-CM

## 2021-02-01 DIAGNOSIS — Z6823 Body mass index (BMI) 23.0-23.9, adult: Secondary | ICD-10-CM

## 2021-02-01 DIAGNOSIS — I4891 Unspecified atrial fibrillation: Secondary | ICD-10-CM | POA: Diagnosis present

## 2021-02-01 DIAGNOSIS — Z993 Dependence on wheelchair: Secondary | ICD-10-CM | POA: Diagnosis not present

## 2021-02-01 DIAGNOSIS — M9702XA Periprosthetic fracture around internal prosthetic left hip joint, initial encounter: Secondary | ICD-10-CM | POA: Diagnosis present

## 2021-02-01 DIAGNOSIS — Z853 Personal history of malignant neoplasm of breast: Secondary | ICD-10-CM

## 2021-02-01 DIAGNOSIS — E8809 Other disorders of plasma-protein metabolism, not elsewhere classified: Secondary | ICD-10-CM | POA: Diagnosis present

## 2021-02-01 DIAGNOSIS — Y846 Urinary catheterization as the cause of abnormal reaction of the patient, or of later complication, without mention of misadventure at the time of the procedure: Secondary | ICD-10-CM | POA: Diagnosis present

## 2021-02-01 DIAGNOSIS — Z7983 Long term (current) use of bisphosphonates: Secondary | ICD-10-CM | POA: Diagnosis not present

## 2021-02-01 DIAGNOSIS — S72115A Nondisplaced fracture of greater trochanter of left femur, initial encounter for closed fracture: Secondary | ICD-10-CM | POA: Diagnosis not present

## 2021-02-01 DIAGNOSIS — Z419 Encounter for procedure for purposes other than remedying health state, unspecified: Secondary | ICD-10-CM

## 2021-02-01 DIAGNOSIS — Z888 Allergy status to other drugs, medicaments and biological substances status: Secondary | ICD-10-CM

## 2021-02-01 DIAGNOSIS — Z20822 Contact with and (suspected) exposure to covid-19: Secondary | ICD-10-CM | POA: Diagnosis present

## 2021-02-01 DIAGNOSIS — I340 Nonrheumatic mitral (valve) insufficiency: Secondary | ICD-10-CM | POA: Diagnosis present

## 2021-02-01 DIAGNOSIS — E785 Hyperlipidemia, unspecified: Secondary | ICD-10-CM | POA: Diagnosis present

## 2021-02-01 DIAGNOSIS — S7222XA Displaced subtrochanteric fracture of left femur, initial encounter for closed fracture: Secondary | ICD-10-CM | POA: Diagnosis present

## 2021-02-01 DIAGNOSIS — W1830XA Fall on same level, unspecified, initial encounter: Secondary | ICD-10-CM | POA: Diagnosis present

## 2021-02-01 DIAGNOSIS — Z7982 Long term (current) use of aspirin: Secondary | ICD-10-CM | POA: Diagnosis not present

## 2021-02-01 DIAGNOSIS — I482 Chronic atrial fibrillation, unspecified: Secondary | ICD-10-CM | POA: Diagnosis not present

## 2021-02-01 DIAGNOSIS — S72002A Fracture of unspecified part of neck of left femur, initial encounter for closed fracture: Secondary | ICD-10-CM

## 2021-02-01 DIAGNOSIS — I5032 Chronic diastolic (congestive) heart failure: Secondary | ICD-10-CM | POA: Diagnosis present

## 2021-02-01 DIAGNOSIS — S72112A Displaced fracture of greater trochanter of left femur, initial encounter for closed fracture: Principal | ICD-10-CM | POA: Diagnosis present

## 2021-02-01 DIAGNOSIS — D62 Acute posthemorrhagic anemia: Secondary | ICD-10-CM | POA: Diagnosis not present

## 2021-02-01 DIAGNOSIS — Z79899 Other long term (current) drug therapy: Secondary | ICD-10-CM | POA: Diagnosis not present

## 2021-02-01 DIAGNOSIS — Z7401 Bed confinement status: Secondary | ICD-10-CM

## 2021-02-01 DIAGNOSIS — Z9011 Acquired absence of right breast and nipple: Secondary | ICD-10-CM

## 2021-02-01 DIAGNOSIS — Z881 Allergy status to other antibiotic agents status: Secondary | ICD-10-CM

## 2021-02-01 DIAGNOSIS — M9702XD Periprosthetic fracture around internal prosthetic left hip joint, subsequent encounter: Secondary | ICD-10-CM | POA: Diagnosis not present

## 2021-02-01 LAB — RESP PANEL BY RT-PCR (FLU A&B, COVID) ARPGX2
Influenza A by PCR: NEGATIVE
Influenza B by PCR: NEGATIVE
SARS Coronavirus 2 by RT PCR: NEGATIVE

## 2021-02-01 LAB — URINALYSIS, ROUTINE W REFLEX MICROSCOPIC
Bilirubin Urine: NEGATIVE
Glucose, UA: NEGATIVE mg/dL
Ketones, ur: NEGATIVE mg/dL
Nitrite: POSITIVE — AB
Protein, ur: 100 mg/dL — AB
RBC / HPF: >50 RBC/hpf — ABNORMAL HIGH (ref 0–5)
Specific Gravity, Urine: 1.016 (ref 1.005–1.030)
WBC, UA: >50 WBC/hpf — ABNORMAL HIGH (ref 0–5)
pH: 6 (ref 5.0–8.0)

## 2021-02-01 LAB — COMPREHENSIVE METABOLIC PANEL
ALT: 12 U/L (ref 0–44)
AST: 34 U/L (ref 15–41)
Albumin: 3.4 g/dL — ABNORMAL LOW (ref 3.5–5.0)
Alkaline Phosphatase: 77 U/L (ref 38–126)
Anion gap: 9 (ref 5–15)
BUN: 12 mg/dL (ref 8–23)
CO2: 30 mmol/L (ref 22–32)
Calcium: 9.2 mg/dL (ref 8.9–10.3)
Chloride: 97 mmol/L — ABNORMAL LOW (ref 98–111)
Creatinine, Ser: 0.58 mg/dL (ref 0.44–1.00)
GFR, Estimated: >60 mL/min (ref >60)
Glucose, Bld: 117 mg/dL — ABNORMAL HIGH (ref 70–99)
Potassium: 4.5 mmol/L (ref 3.5–5.1)
Sodium: 136 mmol/L (ref 135–145)
Total Bilirubin: 0.5 mg/dL (ref 0.3–1.2)
Total Protein: 6.5 g/dL (ref 6.5–8.1)

## 2021-02-01 LAB — CBC WITH DIFFERENTIAL/PLATELET
Abs Immature Granulocytes: 0.11 10*3/uL — ABNORMAL HIGH (ref 0.00–0.07)
Basophils Absolute: 0 10*3/uL (ref 0.0–0.1)
Basophils Relative: 0 %
Eosinophils Absolute: 0.2 10*3/uL (ref 0.0–0.5)
Eosinophils Relative: 1 %
HCT: 45.4 % (ref 36.0–46.0)
Hemoglobin: 14.5 g/dL (ref 12.0–15.0)
Immature Granulocytes: 1 %
Lymphocytes Relative: 10 %
Lymphs Abs: 1.6 10*3/uL (ref 0.7–4.0)
MCH: 30 pg (ref 26.0–34.0)
MCHC: 31.9 g/dL (ref 30.0–36.0)
MCV: 93.8 fL (ref 80.0–100.0)
Monocytes Absolute: 0.8 10*3/uL (ref 0.1–1.0)
Monocytes Relative: 5 %
Neutro Abs: 14.3 10*3/uL — ABNORMAL HIGH (ref 1.7–7.7)
Neutrophils Relative %: 83 %
Platelets: 388 10*3/uL (ref 150–400)
RBC: 4.84 MIL/uL (ref 3.87–5.11)
RDW: 14 % (ref 11.5–15.5)
WBC: 17.1 10*3/uL — ABNORMAL HIGH (ref 4.0–10.5)
nRBC: 0 % (ref 0.0–0.2)

## 2021-02-01 LAB — TYPE AND SCREEN
ABO/RH(D): A POS
Antibody Screen: NEGATIVE

## 2021-02-01 MED ORDER — SODIUM CHLORIDE 0.9 % IV BOLUS
500.0000 mL | Freq: Once | INTRAVENOUS | Status: AC
  Administered 2021-02-01: 500 mL via INTRAVENOUS

## 2021-02-01 MED ORDER — ONDANSETRON HCL 4 MG/2ML IJ SOLN: 4.0000 mg | Freq: Four times a day (QID) | INTRAMUSCULAR | Status: DC | PRN

## 2021-02-01 MED ORDER — QUETIAPINE FUMARATE 25 MG PO TABS
25.0000 mg | ORAL_TABLET | Freq: Every day | ORAL | Status: DC
  Administered 2021-02-01 – 2021-02-09 (×9): 25 mg via ORAL
  Filled 2021-02-01 (×9): qty 1

## 2021-02-01 MED ORDER — TIMOLOL MALEATE 0.5 % OP SOLN
1.0000 [drp] | Freq: Every day | OPHTHALMIC | Status: DC
  Administered 2021-02-02 – 2021-02-09 (×7): 1 [drp] via OPHTHALMIC
  Filled 2021-02-01: qty 5

## 2021-02-01 MED ORDER — SODIUM CHLORIDE 0.9% FLUSH: 3.0000 mL | Freq: Two times a day (BID) | INTRAVENOUS | Status: DC

## 2021-02-01 MED ORDER — LATANOPROST 0.005 % OP SOLN
1.0000 [drp] | Freq: Every day | OPHTHALMIC | Status: DC
  Administered 2021-02-02 – 2021-02-09 (×7): 1 [drp] via OPHTHALMIC
  Filled 2021-02-01: qty 2.5

## 2021-02-01 MED ORDER — SODIUM CHLORIDE 0.9 % IV SOLN: 250.0000 mL | INTRAVENOUS | Status: DC | PRN

## 2021-02-01 MED ORDER — ATORVASTATIN CALCIUM 10 MG PO TABS
10.0000 mg | ORAL_TABLET | Freq: Every day | ORAL | Status: DC
  Administered 2021-02-02 – 2021-02-09 (×7): 10 mg via ORAL
  Filled 2021-02-01 (×7): qty 1

## 2021-02-01 MED ORDER — FENTANYL CITRATE (PF) 100 MCG/2ML IJ SOLN: 12.5000 ug | INTRAMUSCULAR | Status: DC | PRN

## 2021-02-01 MED ORDER — VITAMIN D 25 MCG (1000 UNIT) PO TABS
1000.0000 [IU] | ORAL_TABLET | Freq: Every day | ORAL | Status: DC
  Administered 2021-02-02 – 2021-02-09 (×6): 1000 [IU] via ORAL
  Filled 2021-02-01 (×7): qty 1

## 2021-02-01 MED ORDER — SENNA 8.6 MG PO TABS
1.0000 | ORAL_TABLET | Freq: Two times a day (BID) | ORAL | Status: DC
  Administered 2021-02-01 – 2021-02-09 (×16): 8.6 mg via ORAL
  Filled 2021-02-01 (×16): qty 1

## 2021-02-01 MED ORDER — SODIUM CHLORIDE 0.9% FLUSH: 3.0000 mL | INTRAVENOUS | Status: DC | PRN

## 2021-02-01 MED ORDER — ADULT MULTIVITAMIN W/MINERALS CH
1.0000 | ORAL_TABLET | Freq: Every day | ORAL | Status: DC
  Administered 2021-02-02 – 2021-02-09 (×7): 1 via ORAL
  Filled 2021-02-01 (×7): qty 1

## 2021-02-01 MED ORDER — ENSURE SURGERY PO LIQD
237.0000 mL | Freq: Two times a day (BID) | ORAL | Status: DC
  Administered 2021-02-02 – 2021-02-08 (×11): 237 mL via ORAL
  Filled 2021-02-01 (×14): qty 237

## 2021-02-01 MED ORDER — HYDROCODONE-ACETAMINOPHEN 5-325 MG PO TABS
1.0000 | ORAL_TABLET | Freq: Four times a day (QID) | ORAL | Status: DC | PRN
  Administered 2021-02-06: 1 via ORAL
  Filled 2021-02-01: qty 1

## 2021-02-01 MED ORDER — DOCUSATE SODIUM 100 MG PO CAPS
100.0000 mg | ORAL_CAPSULE | Freq: Two times a day (BID) | ORAL | Status: DC
  Administered 2021-02-01 – 2021-02-02 (×3): 100 mg via ORAL
  Filled 2021-02-01 (×3): qty 1

## 2021-02-01 MED ORDER — ENOXAPARIN SODIUM 40 MG/0.4ML IJ SOSY
40.0000 mg | PREFILLED_SYRINGE | INTRAMUSCULAR | Status: DC
  Administered 2021-02-01: 40 mg via SUBCUTANEOUS
  Filled 2021-02-01: qty 0.4

## 2021-02-01 MED ORDER — SODIUM CHLORIDE 0.9% FLUSH
3.0000 mL | Freq: Two times a day (BID) | INTRAVENOUS | Status: DC
  Administered 2021-02-02 – 2021-02-09 (×13): 3 mL via INTRAVENOUS

## 2021-02-01 MED ORDER — FENTANYL CITRATE (PF) 100 MCG/2ML IJ SOLN
50.0000 ug | INTRAMUSCULAR | Status: DC | PRN
  Administered 2021-02-01: 50 ug via INTRAVENOUS
  Filled 2021-02-01: qty 2

## 2021-02-01 MED ORDER — ONDANSETRON HCL 4 MG PO TABS: 4.0000 mg | ORAL_TABLET | Freq: Four times a day (QID) | ORAL | Status: DC | PRN

## 2021-02-01 MED ORDER — SODIUM CHLORIDE 0.9 % IV SOLN: INTRAVENOUS | Status: DC

## 2021-02-01 MED ORDER — ONDANSETRON HCL 4 MG/2ML IJ SOLN
4.0000 mg | Freq: Once | INTRAMUSCULAR | Status: AC
  Administered 2021-02-01: 4 mg via INTRAVENOUS
  Filled 2021-02-01: qty 2

## 2021-02-01 MED ORDER — FERROUS SULFATE 325 (65 FE) MG PO TABS
325.0000 mg | ORAL_TABLET | Freq: Three times a day (TID) | ORAL | Status: DC
  Administered 2021-02-02 – 2021-02-09 (×17): 325 mg via ORAL
  Filled 2021-02-01 (×19): qty 1

## 2021-02-01 MED ORDER — POLYETHYLENE GLYCOL 3350 17 G PO PACK: 17.0000 g | PACK | Freq: Every day | ORAL | Status: DC | PRN

## 2021-02-01 MED ORDER — DILTIAZEM HCL ER 60 MG PO CP12
60.0000 mg | ORAL_CAPSULE | Freq: Two times a day (BID) | ORAL | Status: DC
  Administered 2021-02-01 – 2021-02-09 (×16): 60 mg via ORAL
  Filled 2021-02-01 (×18): qty 1

## 2021-02-01 NOTE — ED Provider Notes (Signed)
[facility] DEPT Provider Note   CSN: [id number] Arrival date & time: 02/01/21  1428     History Chief Complaint  Patient presents with   [place] is a 87 y.o. female.  HPI She presents for nursing care facility where she was seen to have a witnessed fall, after standing up from a wheelchair.  She had pain and deformity noted from the left hip.  No other recent problems by report.  She is unable to give any history.  Level 5 caveat-dementia    Past Medical History:  Diagnosis Date   (HFpEF) heart failure with preserved ejection fraction ([place])    a. EF 55-65%, January, 2012, question diastolic dysfunction; b. [id number] Echo: EF 55-60%, no rwma. Mild MR. Mildly dil LA. Nl RV fxn. PASP 54mHg.   Breast cancer ([place] 1985   RT MASTECTOMY   Chest pain    Dyslipidemia    Hypertension    Hypokalemia    February, 2014, potassium started   Mitral regurgitation    Mild, echo, 2012   Permanent atrial fibrillation ([facility]    a. Dx 08/2012. CHA2DSD2VASc = 4-5-->warfarin.   Shingles    Shortness of breath    January, 20000000 normal systolic function, question diastolic dysfunction    Patient Active Problem List   Diagnosis Date Noted   Periprosthetic fracture around internal prosthetic left hip joint ([facility] 02/01/2021   Dementia ([facility] 02/01/2021   DNR (do not resuscitate) 02/01/2021   Hip fracture ([facility] 12/06/2020   Generalized weakness 11/27/2020   Low back pain 11/27/2020   Frequent falls 11/27/2020   (HFpEF) heart failure with preserved ejection fraction (HCC)    Chronic atrial fibrillation ([facility] 05/01/2015   Atrophic vaginitis 12/15/2014   Breast CA ([facility] 12/15/2014   Decrease in the ability to hear 12/15/2014   Essential (primary) hypertension 12/15/2014   Fatigue 12/15/2014   Acid reflux 12/15/2014   Glaucoma 12/15/2014   Flu vaccine need 12/15/2014   Blood glucose elevated 12/15/2014   Arthritis, degenerative 12/15/2014    OP (osteoporosis) 12/15/2014   HLD (hyperlipidemia) 12/15/2014   Pain of upper abdomen 12/15/2014   Encounter for therapeutic drug monitoring 09/04/2013   Preop cardiovascular exam 03/07/2013   Hypokalemia    Long term (current) use of anticoagulants 08/22/2012   Hypertension    Breast cancer (HCC)    Shortness of breath    Chest pain    Ejection fraction    Mitral regurgitation    SHINGLES 07/26/2010    Past Surgical History:  Procedure Laterality Date   ABDOMINAL HYSTERECTOMY     age 87  ANTERIOR APPROACH HEMI HIP ARTHROPLASTY Left 12/07/2020   Procedure: ANTERIOR APPROACH HEMI HIP ARTHROPLASTY;  Surgeon: DMeredith Pel MD;  Location: [facility];  Service: Orthopedics;  Laterality: Left;   AUGMENTATION MAMMAPLASTY Right 1985   RT MASTECTOMY FOR BREAST CA   EYE SURGERY     cataract   MASTECTOMY Right 1985   reconstruction inplant as well     OB History   No obstetric history on file.     Family History  Problem Relation Age of Onset   Stroke Mother    Diabetes Father    Cancer Brother        prostate   Breast cancer Neg Hx     Social History   Tobacco Use   Smoking status: Never   Smokeless tobacco: Never  Vaping Use   Vaping Use: Never  used  Substance Use Topics   Alcohol use: No    Alcohol/week: 0.0 standard drinks   Drug use: No    Home Medications Prior to Admission medications   Medication Sig Start Date End Date Taking? Authorizing Provider  acetaminophen (TYLENOL) 325 MG tablet Take 1-2 tablets (325-650 mg total) by mouth every 6 (six) hours as needed for mild pain (pain score 1-3 or temp > 100.5). 12/11/20   Meredith Pel, MD  alendronate (FOSAMAX) 70 MG tablet TAKE 1 TABLET (70 MG TOTAL) BY MOUTH EVERY 7 (SEVEN) DAYS. TAKE WITH A FULL GLASS OF WATER ON AN EMPTY STOMACH. 10/22/20   Jerrol Banana., MD  aspirin EC 325 MG EC tablet Take 1 tablet (325 mg total) by mouth daily with breakfast. 12/11/20   Meredith Pel, MD   atorvastatin (LIPITOR) 10 MG tablet TAKE 1 TABLET (10 MG TOTAL) BY MOUTH DAILY. PLEASE SCHEDULE AN OFFICE VISIT BEFORE ANYMORE REFILLS. 01/26/21   Jerrol Banana., MD  cholecalciferol (VITAMIN D3) 25 MCG (1000 UNIT) tablet Take 1,000 Units by mouth daily at 12 noon.    [provider]  diltiazem (CARDIZEM SR) 60 MG 12 hr capsule Take 60 mg by mouth every 12 (twelve) hours. Hold if BP <110/70    [provider]  docusate sodium (COLACE) 100 MG capsule Take 1 capsule (100 mg total) by mouth 2 (two) times daily. 12/11/20   Bonnielee Haff, MD  feeding supplement (ENSURE SURGERY) LIQD Take 237 mLs by mouth 2 (two) times daily between meals. 12/11/20   Bonnielee Haff, MD  ferrous sulfate 325 (65 FE) MG tablet Take 1 tablet (325 mg total) by mouth 3 (three) times daily after meals. 12/11/20   Bonnielee Haff, MD  HYDROcodone-acetaminophen (NORCO/VICODIN) 5-325 MG tablet Take 1 tablet by mouth every 6 (six) hours as needed for severe pain (pain score 4-6). 12/11/20   Bonnielee Haff, MD  latanoprost (XALATAN) 0.005 % ophthalmic solution Place 1 drop into both eyes daily at 12 noon. 08/01/12   [provider]  methocarbamol (ROBAXIN) 500 MG tablet Take 1 tablet (500 mg total) by mouth every 6 (six) hours as needed for muscle spasms. 12/11/20   Bonnielee Haff, MD  Multiple Vitamin (MULTIVITAMIN WITH MINERALS) TABS tablet Take 1 tablet by mouth daily. 12/12/20   Bonnielee Haff, MD  polyethylene glycol (MIRALAX / GLYCOLAX) 17 g packet Take 17 g by mouth daily as needed for mild constipation. 12/11/20   Bonnielee Haff, MD  QUEtiapine (SEROQUEL) 25 MG tablet Take 1 tablet (25 mg total) by mouth at bedtime. 12/02/20   Fritzi Mandes, MD  senna (SENOKOT) 8.6 MG TABS tablet Take 1 tablet (8.6 mg total) by mouth 2 (two) times daily. 12/11/20   Bonnielee Haff, MD  timolol (TIMOPTIC) 0.5 % ophthalmic solution Place 1 drop into both eyes daily at 12 noon. 08/05/12   [provider]     Allergies    Ciprofloxacin, Cortisone, Nitrofurantoin monohyd macro, and Other  Review of Systems   Review of Systems  Unable to perform ROS: Dementia   Physical Exam Updated Vital Signs BP (!) 160/91   Pulse 91   Temp 98.2 F (36.8 C)   Resp 15   Ht '5\' 6"'$  (1.676 m)   Wt 65 kg   SpO2 97%   BMI 23.13 kg/m   Physical Exam Vitals and nursing note reviewed.  Constitutional:      General: She is in acute distress (uncomfortable).  Appearance: She is well-developed. She is not ill-appearing.  HENT:     Head: Normocephalic and atraumatic.     Right Ear: External ear normal.     Left Ear: External ear normal.     Nose: No congestion or rhinorrhea.  Eyes:     Conjunctiva/sclera: Conjunctivae normal.     Pupils: Pupils are equal, round, and reactive to light.  Neck:     Trachea: Phonation normal.  Cardiovascular:     Rate and Rhythm: Normal rate and regular rhythm.     Heart sounds: Normal heart sounds.  Pulmonary:     Effort: Pulmonary effort is normal.     Breath sounds: Normal breath sounds.  Abdominal:     General: There is no distension.     Palpations: Abdomen is soft.     Tenderness: There is no abdominal tenderness.  Musculoskeletal:     Cervical back: Normal range of motion and neck supple.     Comments: Tender left hip and guarding/pain with passive range of motion.  Left leg is shortened and externally rotated.  Skin:    General: Skin is warm and dry.  Neurological:     Mental Status: She is alert and oriented to person, place, and time.     Cranial Nerves: No cranial nerve deficit.     Motor: No abnormal muscle tone.     Coordination: Coordination normal.  Psychiatric:        Mood and Affect: Mood normal.        Behavior: Behavior normal.    ED Results / Procedures / Treatments   Labs (all labs ordered are listed, but only abnormal results are displayed) Labs Reviewed  COMPREHENSIVE METABOLIC PANEL - Abnormal; Notable for the following  components:      Result Value   Chloride 97 (*)    Glucose, Bld 117 (*)    Albumin 3.4 (*)    All other components within normal limits  CBC WITH DIFFERENTIAL/PLATELET - Abnormal; Notable for the following components:   WBC 17.1 (*)    Neutro Abs 14.3 (*)    Abs Immature Granulocytes 0.11 (*)    All other components within normal limits  URINALYSIS, ROUTINE W REFLEX MICROSCOPIC - Abnormal; Notable for the following components:   Color, Urine AMBER (*)    APPearance CLOUDY (*)    Hgb urine dipstick LARGE (*)    Protein, ur 100 (*)    Nitrite POSITIVE (*)    Leukocytes,Ua LARGE (*)    RBC / HPF >50 (*)    WBC, UA >50 (*)    Bacteria, UA MANY (*)    All other components within normal limits  RESP PANEL BY RT-PCR (FLU A&B, COVID) [id number]  TYPE AND SCREEN    EKG EKG Interpretation  Date/Time:  Monday February 01 2021 16:04:54 EDT Ventricular Rate:  123 PR Interval:    QRS Duration: 78 QT Interval:  321 QTC Calculation: 433 R Axis:   90 Text Interpretation: Atrial fibrillation Ventricular premature complex Borderline right axis deviation Repolarization abnormality, prob rate related Since last tracing rate faster Otherwise no significant change Confirmed by Daleen Bo [phone]) on 02/01/2021 4:12:23 PM  Radiology DG Chest 1 View  Result Date: 02/01/2021 CLINICAL DATA:  Witnessed fall EXAM: CHEST  1 VIEW COMPARISON:  12/07/2020 FINDINGS: Capsular calcification at RIGHT breast prosthesis. Upper normal size of cardiac silhouette. Mediastinal contours and pulmonary vascularity normal. Lungs grossly clear. No pleural effusion or pneumothorax. Diffuse osseous demineralization with dextroconvex  thoracic scoliosis. Surgical clips RIGHT axilla. IMPRESSION: No scratch acute abnormalities. Electronically Signed   By: Lavonia Dana M.D.   On: 02/01/2021 16:17   CT Head Wo Contrast  Result Date: 02/01/2021 CLINICAL DATA:  Witnessed fall trying to stand from wheelchair, EMS states patient struck  head, denies loss of consciousness EXAM: CT HEAD WITHOUT CONTRAST TECHNIQUE: Contiguous axial images were obtained from the base of the skull through the vertex without intravenous contrast. Sagittal and coronal MPR images reconstructed from axial data set. COMPARISON:  01/09/2021 FINDINGS: Brain: Undo generalized atrophy. Normal ventricular morphology. No midline shift or mass effect. Small vessel chronic ischemic changes of deep cerebral white matter. No intracranial hemorrhage or evidence of acute infarction. Asymmetric prominence of extra-axial fluid at anterior aspect of LEFT middle cranial fossa versus RIGHT, likely represents asymmetric atrophy of the temporal lobes, doubt arachnoid cyst. No acute extra-axial hemorrhage. Vascular: Atherosclerotic calcification at RIGHT vertebral artery. No hyperdense vessels. Skull: Demineralized but intact Sinuses/Orbits: Clear Other: N/A IMPRESSION: Atrophy with small vessel chronic ischemic changes of deep cerebral white matter. No acute intracranial abnormalities. Electronically Signed   By: Lavonia Dana M.D.   On: 02/01/2021 16:23   CT Cervical Spine Wo Contrast  Result Date: 02/01/2021 CLINICAL DATA:  Witnessed fall while trying to stand from wheelchair EXAM: CT CERVICAL SPINE WITHOUT CONTRAST TECHNIQUE: Multidetector CT imaging of the cervical spine was performed without intravenous contrast. Multiplanar CT image reconstructions were also generated. COMPARISON:  01/09/2021 FINDINGS: Alignment: Slight anterolisthesis at C3-C4 and T2-T3 unchanged. Remaining alignments normal. Skull base and vertebrae: Osseous demineralization. Vertebral body heights maintained. Multilevel disc space narrowing and endplate spur formation throughout cervical spine. Multilevel facet degenerative changes. Skull base intact. No fracture, additional subluxation, or bone destruction. Soft tissues and spinal canal: Prevertebral soft tissues normal thickness. Atherosclerotic calcifications at  carotid bifurcations bilaterally as well as at RIGHT vertebral artery. Disc levels:  No specific abnormalities Upper chest: Small BILATERAL pleural effusions. Lung apices otherwise clear. Other: N/A IMPRESSION: Multilevel degenerative disc and facet disease changes of the cervical spine. No acute cervical spine abnormalities. Small BILATERAL pleural effusions. Electronically Signed   By: Lavonia Dana M.D.   On: 02/01/2021 16:29   DG Hip Unilat With Pelvis 2-3 Views Left  Result Date: 02/01/2021 CLINICAL DATA:  Witnessed fall trying to stand from wheelchair, shortening and rotation of LEFT leg EXAM: DG HIP (WITH OR WITHOUT PELVIS) 2-3V LEFT COMPARISON:  None FINDINGS: Osseous demineralization. LEFT hip prosthesis seen. Narrowing of the hip joints bilaterally. SI joints preserved. Pelvis intact. Mildly displaced periprosthetic fracture identified at the proximal LEFT femur without dislocation. IMPRESSION: LEFT hip prosthesis with acute mildly displaced periprosthetic fracture of the proximal LEFT femur. Electronically Signed   By: Lavonia Dana M.D.   On: 02/01/2021 16:16    Procedures Procedures   Medications Ordered in ED Medications  fentaNYL (SUBLIMAZE) injection 12.5 mcg (has no administration in time range)  ondansetron (ZOFRAN) injection 4 mg (4 mg Intravenous Given 02/01/21 1619)  sodium chloride 0.9 % bolus 500 mL (0 mLs Intravenous Stopped 02/01/21 1654)    ED Course  I have reviewed the triage vital signs and the nursing notes.  Pertinent labs & imaging results that were available during my care of the patient were reviewed by me and considered in my medical decision making (see chart for details).  Clinical Course as of 02/01/21 1802  Mon Feb 01, 2021  1611 EKG with rapid A. fib, rate 123.  Will give second  bolus of saline and reevaluate. [EW]    Clinical Course User Index [EW] Daleen Bo, MD   MDM Rules/Calculators/A&P                            Patient Vitals for the past 24  hrs:  BP Temp Pulse Resp SpO2 Height Weight  02/01/21 1607 (!) 160/91 -- 91 15 97 % -- --  02/01/21 1500 (!) 172/107 98.2 F (36.8 C) 96 12 96 % -- --  02/01/21 1448 -- -- -- -- -- '5\' 6"'$  (1.676 m) 65 kg  02/01/21 1440 (!) 182/103 98 F (36.7 C) 95 13 97 % -- --      Medical Decision Making:  This patient is presenting for evaluation of injury from fall, which does require a range of treatment options, and is a complaint that involves a high risk of morbidity and mortality. The differential diagnoses include fracture, contusion, metabolic disorder, hemodynamic instability. I decided to review old records, and in summary elderly female unable to give any history, lives in a nursing care facility.  History of atrial fibrillation, hypertension, arthritis, frequent falls, heart failure.  Prior left hip fracture with prosthesis placed.   Clinical Laboratory Tests Ordered, included CBC, Metabolic panel, Urinalysis, and viral panel . Review indicates normal except urinalysis indicating infection, viral panel normal, CBC normal except chloride low and glucose high CBC with abnormal white count. Radiologic Tests Ordered, included chest x-ray, pelvis, left hip, CT head and cervical spine.  I independently Visualized: Radiographic images, which show left hip periprosthetic fracture no other acute injuries    Critical Interventions-clinical evaluation, laboratory testing, radiography, medication treatment, observation  After These Interventions, the Patient was reevaluated and was found with hip fracture requiring surgical repair.  Patient had a hip replacement, June 2022 by Dr. Marlou Sa.  She will require additional management.  CRITICAL CARE-no Performed by: Daleen Bo  Nursing Notes Reviewed/ Care Coordinated Applicable Imaging Reviewed Interpretation of Laboratory Data incorporated into ED treatment  Patient orthopedics to arrange for consultation and operative management    Final  Clinical Impression(s) / ED Diagnoses Final diagnoses:  Closed fracture of left hip, initial encounter [facility])  Urinary tract infection without hematuria, site unspecified    Rx / DC Orders ED Discharge Orders     None        Daleen Bo, MD 02/01/21 [phone]

## 2021-02-01 NOTE — ED Notes (Signed)
Report given to [facility] 

## 2021-02-01 NOTE — ED Triage Notes (Signed)
BIB EMS from [facility] with witnessed fall after trying to stand from wheelchair. Patient is wheelchair bound. Shortening/rotation noted to left leg and EMS stated patient hit head, denies LOC. Hx of L hip and L femur fracture repair. Foley in place on arrival. Oriented to self only. No BT usage per EMS.

## 2021-02-01 NOTE — H&P (Signed)
History and Physical    Latasha Anderson [id number] DOB: May 25, 1931 DOA: 02/01/2021  PCP: Jerrol Banana., MD  Patient coming from: Latasha Anderson  Chief Complaint: Witnessed fall   HPI: Latasha Anderson is a 87 y.o. female with medical history significant of A Fib, HTN, HLD, dementia, who presents from her SNF after witnessed fall trying to stand from wheelchair. She is wheelchair bound at baseline. At baseline, she has advanced dementia, is oriented to self only, does not answer questions appropriately, can feed self with fingers but doesn't eat much, is otherwise total care at baseline. Per daughter, this is maybe her fourth fall.   ED Course: Xray showed LEFT hip prosthesis with acute mildly displaced periprosthetic fracture of the proximal LEFT femur. Orthopedic surgery consulted.   Review of Systems: Unable to gather due to dementia.    Past Medical History:  Diagnosis Date   (HFpEF) heart failure with preserved ejection fraction ([place])    a. EF 55-65%, January, 2012, question diastolic dysfunction; b. [id number] Echo: EF 55-60%, no rwma. Mild MR. Mildly dil LA. Nl RV fxn. PASP 79mHg.   [place]   RT MASTECTOMY   Chest pain    Dyslipidemia    Hypertension    Hypokalemia    February, 2014, potassium started   Mitral regurgitation    Mild, echo, 2012   Permanent atrial fibrillation ([place]    a. Dx 08/2012. CHA2DSD2VASc = 4-5-->warfarin.   Shingles    Shortness of breath    January, 20000000 normal systolic function, question diastolic dysfunction    Past Surgical History:  Procedure Laterality Date   ABDOMINAL HYSTERECTOMY     age 87  ANTERIOR APPROACH HEMI HIP ARTHROPLASTY Left 12/07/2020   Procedure: ANTERIOR APPROACH HEMI HIP ARTHROPLASTY;  Surgeon: DMeredith Pel MD;  Location: [facility];  Service: Orthopedics;  Laterality: Left;   AUGMENTATION MAMMAPLASTY Right 1985   RT MASTECTOMY FOR BREAST CA   EYE SURGERY     cataract   MASTECTOMY Right  1985   reconstruction inplant as well     reports that she has never smoked. She has never used smokeless tobacco. She reports that she does not drink alcohol and does not use drugs.  Allergies  Allergen Reactions   Ciprofloxacin Nausea And Vomiting   Cortisone Other (See Comments)    Pt stated this makes her turn blue   Nitrofurantoin Monohyd Macro Other (See Comments)    GI upset & chills.   Other Other (See Comments)    Mycins - unknown reaction    Family History  Problem Relation Age of Onset   Stroke Mother    Diabetes Father    Cancer Brother        prostate   Breast cancer Neg Hx      Prior to Admission medications   Medication Sig Start Date End Date Taking? Authorizing Provider  acetaminophen (TYLENOL) 325 MG tablet Take 1-2 tablets (325-650 mg total) by mouth every 6 (six) hours as needed for mild pain (pain score 1-3 or temp > 100.5). 12/11/20   DMeredith Pel MD  alendronate (FOSAMAX) 70 MG tablet TAKE 1 TABLET (70 MG TOTAL) BY MOUTH EVERY 7 (SEVEN) DAYS. TAKE WITH A FULL GLASS OF WATER ON AN EMPTY STOMACH. 10/22/20   GJerrol Banana, MD  aspirin EC 325 MG EC tablet Take 1 tablet (325 mg total) by mouth daily with breakfast. 12/11/20   DMarlou SaGTonna Corner MD  atorvastatin (LIPITOR) 10 MG tablet TAKE 1 TABLET (10 MG TOTAL) BY MOUTH DAILY. PLEASE SCHEDULE AN OFFICE VISIT BEFORE ANYMORE REFILLS. 01/26/21   Jerrol Banana., MD  cholecalciferol (VITAMIN D3) 25 MCG (1000 UNIT) tablet Take 1,000 Units by mouth daily at 12 noon.    [provider]  diltiazem (CARDIZEM SR) 60 MG 12 hr capsule Take 60 mg by mouth every 12 (twelve) hours. Hold if BP <110/70    [provider]  docusate sodium (COLACE) 100 MG capsule Take 1 capsule (100 mg total) by mouth 2 (two) times daily. 12/11/20   Bonnielee Haff, MD  feeding supplement (ENSURE SURGERY) LIQD Take 237 mLs by mouth 2 (two) times daily between meals. 12/11/20   Bonnielee Haff, MD  ferrous  sulfate 325 (65 FE) MG tablet Take 1 tablet (325 mg total) by mouth 3 (three) times daily after meals. 12/11/20   Bonnielee Haff, MD  HYDROcodone-acetaminophen (NORCO/VICODIN) 5-325 MG tablet Take 1 tablet by mouth every 6 (six) hours as needed for severe pain (pain score 4-6). 12/11/20   Bonnielee Haff, MD  latanoprost (XALATAN) 0.005 % ophthalmic solution Place 1 drop into both eyes daily at 12 noon. 08/01/12   [provider]  methocarbamol (ROBAXIN) 500 MG tablet Take 1 tablet (500 mg total) by mouth every 6 (six) hours as needed for muscle spasms. 12/11/20   Bonnielee Haff, MD  Multiple Vitamin (MULTIVITAMIN WITH MINERALS) TABS tablet Take 1 tablet by mouth daily. 12/12/20   Bonnielee Haff, MD  polyethylene glycol (MIRALAX / GLYCOLAX) 17 g packet Take 17 g by mouth daily as needed for mild constipation. 12/11/20   Bonnielee Haff, MD  QUEtiapine (SEROQUEL) 25 MG tablet Take 1 tablet (25 mg total) by mouth at bedtime. 12/02/20   Fritzi Mandes, MD  senna (SENOKOT) 8.6 MG TABS tablet Take 1 tablet (8.6 mg total) by mouth 2 (two) times daily. 12/11/20   Bonnielee Haff, MD  timolol (TIMOPTIC) 0.5 % ophthalmic solution Place 1 drop into both eyes daily at 12 noon. 08/05/12   [provider]    Physical Exam: Vitals:   02/01/21 1440 02/01/21 1448 02/01/21 1500 02/01/21 1607  BP: (!) 182/103  (!) 172/107 (!) 160/91  Pulse: 95  96 91  Resp: '13  12 15  '$ Temp: 98 F (36.7 C)  98.2 F (36.8 C)   SpO2: 97%  96% 97%  Weight:  65 kg    Height:  '5\' 6"'$  (1.676 m)       Constitutional: NAD, calm, comfortable Eyes: PERRL, lids and conjunctivae normal Respiratory: Clear to auscultation bilaterally, no wheezing, no crackles. Normal respiratory effort. No accessory muscle use.  Cardiovascular: Irreg rhythm, rate 110s, no murmurs. + extremity edema.  Abdomen: Soft, nondistended, nontender to palpation. Bowel sounds positive.  Musculoskeletal: No joint deformity upper and lower extremities. No  contractures. Normal muscle tone.  Skin: no rashes, lesions, ulcers on exposed skin  Neurologic: Alert only, does not answer questions  Psychiatric: Advanced dementia   Labs on Admission: I have personally reviewed following labs and imaging studies  CBC: Recent Labs  Lab 02/01/21 1509  WBC 17.1*  NEUTROABS 14.3*  HGB 14.5  HCT 45.4  MCV 93.8  PLT 123456   Basic Metabolic Panel: Recent Labs  Lab 02/01/21 1509  NA 136  K 4.5  CL 97*  CO2 30  GLUCOSE 117*  BUN 12  CREATININE 0.58  CALCIUM 9.2   GFR: Estimated Creatinine Clearance: 45.5 mL/min (by C-G formula  based on SCr of 0.58 mg/dL). Liver Function Tests: Recent Labs  Lab 02/01/21 1509  AST 34  ALT 12  ALKPHOS 77  BILITOT 0.5  PROT 6.5  ALBUMIN 3.4*   No results for input(s): LIPASE, AMYLASE in the last 168 hours. No results for input(s): AMMONIA in the last 168 hours. Coagulation Profile: No results for input(s): INR, PROTIME in the last 168 hours. Cardiac Enzymes: No results for input(s): CKTOTAL, CKMB, CKMBINDEX, TROPONINI in the last 168 hours. BNP (last 3 results) No results for input(s): PROBNP in the last 8760 hours. HbA1C: No results for input(s): HGBA1C in the last 72 hours. CBG: No results for input(s): GLUCAP in the last 168 hours. Lipid Profile: No results for input(s): CHOL, HDL, LDLCALC, TRIG, CHOLHDL, LDLDIRECT in the last 72 hours. Thyroid Function Tests: No results for input(s): TSH, T4TOTAL, FREET4, T3FREE, THYROIDAB in the last 72 hours. Anemia Panel: No results for input(s): VITAMINB12, FOLATE, FERRITIN, TIBC, IRON, RETICCTPCT in the last 72 hours. Urine analysis:    Component Value Date/Time   COLORURINE YELLOW (A) 01/09/2021 1251   APPEARANCEUR HAZY (A) 01/09/2021 1251   LABSPEC 1.006 01/09/2021 1251   PHURINE 8.0 01/09/2021 1251   GLUCOSEU NEGATIVE 01/09/2021 1251   HGBUR NEGATIVE 01/09/2021 1251   BILIRUBINUR NEGATIVE 01/09/2021 1251   KETONESUR NEGATIVE 01/09/2021 1251    PROTEINUR NEGATIVE 01/09/2021 1251   NITRITE POSITIVE (A) 01/09/2021 1251   LEUKOCYTESUR MODERATE (A) 01/09/2021 1251   Sepsis Labs: !!!!!!!!!!!!!!!!!!!!!!!!!!!!!!!!!!!!!!!!!!!! '@LABRCNTIP'$ (procalcitonin:4,lacticidven:4) )No results found for this or any previous visit (from the past 240 hour(s)).   Radiological Exams on Admission: DG Chest 1 View  Result Date: 02/01/2021 CLINICAL DATA:  Witnessed fall EXAM: CHEST  1 VIEW COMPARISON:  12/07/2020 FINDINGS: Capsular calcification at RIGHT breast prosthesis. Upper normal size of cardiac silhouette. Mediastinal contours and pulmonary vascularity normal. Lungs grossly clear. No pleural effusion or pneumothorax. Diffuse osseous demineralization with dextroconvex thoracic scoliosis. Surgical clips RIGHT axilla. IMPRESSION: No scratch acute abnormalities. Electronically Signed   By: Lavonia Dana M.D.   On: 02/01/2021 16:17   CT Head Wo Contrast  Result Date: 02/01/2021 CLINICAL DATA:  Witnessed fall trying to stand from wheelchair, EMS states patient struck head, denies loss of consciousness EXAM: CT HEAD WITHOUT CONTRAST TECHNIQUE: Contiguous axial images were obtained from the base of the skull through the vertex without intravenous contrast. Sagittal and coronal MPR images reconstructed from axial data set. COMPARISON:  01/09/2021 FINDINGS: Brain: Undo generalized atrophy. Normal ventricular morphology. No midline shift or mass effect. Small vessel chronic ischemic changes of deep cerebral white matter. No intracranial hemorrhage or evidence of acute infarction. Asymmetric prominence of extra-axial fluid at anterior aspect of LEFT middle cranial fossa versus RIGHT, likely represents asymmetric atrophy of the temporal lobes, doubt arachnoid cyst. No acute extra-axial hemorrhage. Vascular: Atherosclerotic calcification at RIGHT vertebral artery. No hyperdense vessels. Skull: Demineralized but intact Sinuses/Orbits: Clear Other: N/A IMPRESSION: Atrophy with small  vessel chronic ischemic changes of deep cerebral white matter. No acute intracranial abnormalities. Electronically Signed   By: Lavonia Dana M.D.   On: 02/01/2021 16:23   CT Cervical Spine Wo Contrast  Result Date: 02/01/2021 CLINICAL DATA:  Witnessed fall while trying to stand from wheelchair EXAM: CT CERVICAL SPINE WITHOUT CONTRAST TECHNIQUE: Multidetector CT imaging of the cervical spine was performed without intravenous contrast. Multiplanar CT image reconstructions were also generated. COMPARISON:  01/09/2021 FINDINGS: Alignment: Slight anterolisthesis at C3-C4 and T2-T3 unchanged. Remaining alignments normal. Skull base and vertebrae: Osseous demineralization.  Vertebral body heights maintained. Multilevel disc space narrowing and endplate spur formation throughout cervical spine. Multilevel facet degenerative changes. Skull base intact. No fracture, additional subluxation, or bone destruction. Soft tissues and spinal canal: Prevertebral soft tissues normal thickness. Atherosclerotic calcifications at carotid bifurcations bilaterally as well as at RIGHT vertebral artery. Disc levels:  No specific abnormalities Upper chest: Small BILATERAL pleural effusions. Lung apices otherwise clear. Other: N/A IMPRESSION: Multilevel degenerative disc and facet disease changes of the cervical spine. No acute cervical spine abnormalities. Small BILATERAL pleural effusions. Electronically Signed   By: Lavonia Dana M.D.   On: 02/01/2021 16:29   DG Hip Unilat With Pelvis 2-3 Views Left  Result Date: 02/01/2021 CLINICAL DATA:  Witnessed fall trying to stand from wheelchair, shortening and rotation of LEFT leg EXAM: DG HIP (WITH OR WITHOUT PELVIS) 2-3V LEFT COMPARISON:  None FINDINGS: Osseous demineralization. LEFT hip prosthesis seen. Narrowing of the hip joints bilaterally. SI joints preserved. Pelvis intact. Mildly displaced periprosthetic fracture identified at the proximal LEFT femur without dislocation. IMPRESSION: LEFT  hip prosthesis with acute mildly displaced periprosthetic fracture of the proximal LEFT femur. Electronically Signed   By: Lavonia Dana M.D.   On: 02/01/2021 16:16    EKG: Independently reviewed. A Fib, rate 123   Assessment/Plan Principal Problem:   Periprosthetic fracture around internal prosthetic left hip joint (HCC) Active Problems:   HLD (hyperlipidemia)   Chronic atrial fibrillation (HCC)   Dementia (HCC)   Left periprosthetic fracture of left femur after mechanical fall -Orthopedic surgery consulted  -Pain control   Leukocytosis -Could be stress response, repeat labs in AM  -UA pending   Chronic A Fib -Hold aspirin -Continue cardizem  -Tele   HLD -Continue lipitor   Advanced dementia -Continue seroquel    DVT prophylaxis: Lovenox  Code Status: DNR, confirmed with daughter  Family Communication: Daughter over the phone  Disposition Plan: Pending post-surgical course Consults called: Orthopedic surgery    Severity of Illness: The appropriate patient status for this patient is INPATIENT. Inpatient status is judged to be reasonable and necessary in order to provide the required intensity of service to ensure the patient's safety. The patient's presenting symptoms, physical exam findings, and initial radiographic and laboratory data in the context of their chronic comorbidities is felt to place them at high risk for further clinical deterioration. Furthermore, it is not anticipated that the patient will be medically stable for discharge from the hospital within 2 midnights of admission.   * I certify that at the point of admission it is my clinical judgment that the patient will require inpatient hospital care spanning beyond 2 midnights from the point of admission due to high intensity of service, high risk for further deterioration and high frequency of surveillance required.Dessa Phi, DO [facility] 02/01/2021, 5:12 PM   Available via Epic secure chat  7am-7pm After these hours, please refer to coverage provider listed on [email or web address]

## 2021-02-01 NOTE — ED Notes (Signed)
Called [facility] at [facility] to give report. Nurse will call back

## 2021-02-01 NOTE — Progress Notes (Signed)
Pt resting with pt at bedside. Pt husband is concerned about what the next steps will be. I told him we will keep him posted.

## 2021-02-01 NOTE — Plan of Care (Signed)
  Problem: Clinical Measurements: Goal: Ability to maintain clinical measurements within normal limits will improve Outcome: Progressing Goal: Diagnostic test results will improve Outcome: Progressing   

## 2021-02-01 NOTE — Progress Notes (Addendum)
Dr. Marlou Sa contacted me about patient's new injury of left periprosthetic hip fracture.  I will speak with the family to determine their wishes for the patient and to talk about treatment options.

## 2021-02-01 NOTE — ED Notes (Signed)
[facility] called for transport to [id number] at [facility]

## 2021-02-02 ENCOUNTER — Inpatient Hospital Stay (HOSPITAL_COMMUNITY): Payer: Medicare PPO

## 2021-02-02 DIAGNOSIS — S7222XA Displaced subtrochanteric fracture of left femur, initial encounter for closed fracture: Secondary | ICD-10-CM | POA: Diagnosis not present

## 2021-02-02 DIAGNOSIS — M9702XD Periprosthetic fracture around internal prosthetic left hip joint, subsequent encounter: Secondary | ICD-10-CM | POA: Diagnosis not present

## 2021-02-02 DIAGNOSIS — M9702XA Periprosthetic fracture around internal prosthetic left hip joint, initial encounter: Secondary | ICD-10-CM | POA: Diagnosis not present

## 2021-02-02 DIAGNOSIS — S72115A Nondisplaced fracture of greater trochanter of left femur, initial encounter for closed fracture: Secondary | ICD-10-CM | POA: Diagnosis not present

## 2021-02-02 DIAGNOSIS — I482 Chronic atrial fibrillation, unspecified: Secondary | ICD-10-CM

## 2021-02-02 LAB — BASIC METABOLIC PANEL
Anion gap: 9 (ref 5–15)
BUN: 14 mg/dL (ref 8–23)
CO2: 28 mmol/L (ref 22–32)
Calcium: 9 mg/dL (ref 8.9–10.3)
Chloride: 97 mmol/L — ABNORMAL LOW (ref 98–111)
Creatinine, Ser: 0.67 mg/dL (ref 0.44–1.00)
GFR, Estimated: >60 mL/min (ref >60)
Glucose, Bld: 186 mg/dL — ABNORMAL HIGH (ref 70–99)
Potassium: 4.1 mmol/L (ref 3.5–5.1)
Sodium: 134 mmol/L — ABNORMAL LOW (ref 135–145)

## 2021-02-02 LAB — CBC
HCT: 36.8 % (ref 36.0–46.0)
Hemoglobin: 11.7 g/dL — ABNORMAL LOW (ref 12.0–15.0)
MCH: 29.2 pg (ref 26.0–34.0)
MCHC: 31.8 g/dL (ref 30.0–36.0)
MCV: 91.8 fL (ref 80.0–100.0)
Platelets: 359 10*3/uL (ref 150–400)
RBC: 4.01 MIL/uL (ref 3.87–5.11)
RDW: 14 % (ref 11.5–15.5)
WBC: 11.4 10*3/uL — ABNORMAL HIGH (ref 4.0–10.5)
nRBC: 0 % (ref 0.0–0.2)

## 2021-02-02 LAB — SURGICAL PCR SCREEN
MRSA, PCR: NEGATIVE
Staphylococcus aureus: NEGATIVE

## 2021-02-02 MED ORDER — ACETAMINOPHEN 325 MG PO TABS: 650.0000 mg | ORAL_TABLET | Freq: Four times a day (QID) | ORAL | Status: DC | PRN

## 2021-02-02 MED ORDER — POVIDONE-IODINE 10 % EX SWAB
2.0000 "application " | Freq: Once | CUTANEOUS | Status: AC
  Administered 2021-02-03: 2 via TOPICAL

## 2021-02-02 MED ORDER — HYDROMORPHONE HCL 1 MG/ML IJ SOLN: 0.5000 mg | INTRAMUSCULAR | Status: DC | PRN

## 2021-02-02 MED ORDER — CEFAZOLIN SODIUM-DEXTROSE 2-4 GM/100ML-% IV SOLN
2.0000 g | INTRAVENOUS | Status: AC
  Administered 2021-02-03: 2 g via INTRAVENOUS
  Filled 2021-02-02: qty 100

## 2021-02-02 MED ORDER — TRANEXAMIC ACID-NACL 1000-0.7 MG/100ML-% IV SOLN
1000.0000 mg | INTRAVENOUS | Status: AC
  Administered 2021-02-03: 1000 mg via INTRAVENOUS
  Filled 2021-02-02: qty 100

## 2021-02-02 MED ORDER — CHLORHEXIDINE GLUCONATE CLOTH 2 % EX PADS
6.0000 | MEDICATED_PAD | Freq: Every day | CUTANEOUS | Status: DC
  Administered 2021-02-04 – 2021-02-08 (×4): 6 via TOPICAL

## 2021-02-02 MED ORDER — SODIUM CHLORIDE 0.9 % IV SOLN
1.0000 g | INTRAVENOUS | Status: DC
  Administered 2021-02-02 – 2021-02-06 (×5): 1 g via INTRAVENOUS
  Filled 2021-02-02 (×5): qty 10

## 2021-02-02 MED ORDER — TRANEXAMIC ACID 1000 MG/10ML IV SOLN
2000.0000 mg | INTRAVENOUS | Status: DC
  Filled 2021-02-02: qty 20

## 2021-02-02 NOTE — Progress Notes (Signed)
Pt arrived with foley. I attempted to get an order. Cecille Rubin, Agricultural consultant will follow-up.

## 2021-02-02 NOTE — Progress Notes (Signed)
PROGRESS NOTE    Latasha Anderson  [id number] DOB: Feb 15, 1931 DOA: 02/01/2021 PCP: Jerrol Banana., MD  Brief Narrative:Latasha Anderson is an 87 y.o. female with medical history significant of A Fib, HTN, HLD, dementia, who presented from her SNF after witnessed fall trying to stand from wheelchair. She is wheelchair bound at baseline with advanced dementia, is oriented to self only, does not answer questions appropriately, can feed self with fingers but doesn't eat much, is otherwise total care at baseline.In ED, Xray showed LEFT hip prosthesis with acute mildly displaced periprosthetic fracture of the proximal LEFT femur   Assessment & Plan:   Left periprosthetic fracture of left femur after mechanical fall -Orthopedic surgery consulted, Dr.Xu suggested options to family, await decision -she is mostly wheelchair-bound and total care at SNF -Pain control, decrease narcotics to prevent oversedation -Lovenox for DVT prophylaxis  Leukocytosis, abnormal urinalysis -Start IV ceftriaxone, follow-up urine cultures   Chronic A Fib -Hold aspirin -Continue cardizem    HLD -Continue lipitor    Advanced dementia -Continue seroquel   DVT prophylaxis: Lovenox Code Status: DNR Family Communication: Husband and daughter at bedside Disposition Plan:  Status is: Inpatient  Remains inpatient appropriate because:Inpatient level of care appropriate due to severity of illness  Dispo: The patient is from: SNF              Anticipated d/c is to: SNF              Patient currently is not medically stable to d/c.   Difficult to place patient No        Consultants:  Orthopedics  Procedures:   Antimicrobials:    Subjective: -More sleepy this morning after pain medicines  Objective: Vitals:   02/01/21 1607 02/01/21 1858 02/01/21 2057 02/02/21 0750  BP: (!) 160/91 (!) 163/100 (!) 175/92 125/64  Pulse: 91 99 98 89  Resp: '15 16 14 18  '$ Temp:  97.8 F (36.6 C) 97.6 F  (36.4 C) 98.6 F (37 C)  TempSrc:  Oral Axillary Oral  SpO2: 97% 92% 96% 97%  Weight:      Height:        Intake/Output Summary (Last 24 hours) at 02/02/2021 1204 Last data filed at 02/02/2021 0600 Gross per 24 hour  Intake 491.67 ml  Output 450 ml  Net 41.67 ml   Filed Weights   02/01/21 1448  Weight: 65 kg    Examination:  General exam: Chronically ill elderly female, laying in bed, somnolent, easily arousable, mumbles few words, oriented to self only CVS: S1-S2, regular rate rhythm Lungs: Decreased breath sounds to bases Abdomen: Soft, nontender, bowel sounds present Extremities: No edema Skin: No rash on exposed skin Psych: Advanced dementia   Data Reviewed:   CBC: Recent Labs  Lab 02/01/21 1509 02/02/21 0155  WBC 17.1* 11.4*  NEUTROABS 14.3*  --   HGB 14.5 11.7*  HCT 45.4 36.8  MCV 93.8 91.8  PLT 388 [id number]   Basic Metabolic Panel: Recent Labs  Lab 02/01/21 1509 02/02/21 0155  NA 136 134*  K 4.5 4.1  CL 97* 97*  CO2 30 28  GLUCOSE 117* 186*  BUN 12 14  CREATININE 0.58 0.67  CALCIUM 9.2 9.0   GFR: Estimated Creatinine Clearance: 45.5 mL/min (by C-G formula based on SCr of 0.67 mg/dL). Liver Function Tests: Recent Labs  Lab 02/01/21 1509  AST 34  ALT 12  ALKPHOS 77  BILITOT 0.5  PROT 6.5  ALBUMIN 3.4*  No results for input(s): LIPASE, AMYLASE in the last 168 hours. No results for input(s): AMMONIA in the last 168 hours. Coagulation Profile: No results for input(s): INR, PROTIME in the last 168 hours. Cardiac Enzymes: No results for input(s): CKTOTAL, CKMB, CKMBINDEX, TROPONINI in the last 168 hours. BNP (last 3 results) No results for input(s): PROBNP in the last 8760 hours. HbA1C: No results for input(s): HGBA1C in the last 72 hours. CBG: No results for input(s): GLUCAP in the last 168 hours. Lipid Profile: No results for input(s): CHOL, HDL, LDLCALC, TRIG, CHOLHDL, LDLDIRECT in the last 72 hours. Thyroid Function Tests: No results  for input(s): TSH, T4TOTAL, FREET4, T3FREE, THYROIDAB in the last 72 hours. Anemia Panel: No results for input(s): VITAMINB12, FOLATE, FERRITIN, TIBC, IRON, RETICCTPCT in the last 72 hours. Urine analysis:    Component Value Date/Time   COLORURINE AMBER (A) 02/01/2021 1631   APPEARANCEUR CLOUDY (A) 02/01/2021 1631   LABSPEC 1.016 02/01/2021 1631   PHURINE 6.0 02/01/2021 1631   GLUCOSEU NEGATIVE 02/01/2021 1631   HGBUR LARGE (A) 02/01/2021 1631   BILIRUBINUR NEGATIVE 02/01/2021 1631   KETONESUR NEGATIVE 02/01/2021 1631   PROTEINUR 100 (A) 02/01/2021 1631   NITRITE POSITIVE (A) 02/01/2021 1631   LEUKOCYTESUR LARGE (A) 02/01/2021 1631   Sepsis Labs: '@LABRCNTIP'$ (procalcitonin:4,lacticidven:4)  ) Recent Results (from the past 240 hour(s))  Resp Panel by RT-PCR (Flu A&B, Covid) Nasopharyngeal Swab     Status: None   Collection Time: 02/01/21  3:09 PM   Specimen: Nasopharyngeal Swab; Nasopharyngeal(NP) swabs in vial transport medium  Result Value Ref Range Status   SARS Coronavirus 2 by RT PCR NEGATIVE NEGATIVE Final    Comment: (NOTE) SARS-CoV-2 target nucleic acids are NOT DETECTED.  The SARS-CoV-2 RNA is generally detectable in upper respiratory specimens during the acute phase of infection. The lowest concentration of SARS-CoV-2 viral copies this assay can detect is 138 copies/mL. A negative result does not preclude SARS-Cov-2 infection and should not be used as the sole basis for treatment or other patient management decisions. A negative result may occur with  improper specimen collection/handling, submission of specimen other than nasopharyngeal swab, presence of viral mutation(s) within the areas targeted by this assay, and inadequate number of viral copies(<138 copies/mL). A negative result must be combined with clinical observations, patient history, and epidemiological information. The expected result is Negative.  Fact Sheet for Patients:   EntrepreneurPulse.com.au  Fact Sheet for Healthcare Providers:  IncredibleEmployment.be  This test is no t yet approved or cleared by the [place] FDA and  has been authorized for detection and/or diagnosis of SARS-CoV-2 by FDA under an Emergency Use Authorization (EUA). This EUA will remain  in effect (meaning this test can be used) for the duration of the COVID-19 declaration under Section 564(b)(1) of the Act, 21 U.S.C.section 360bbb-3(b)(1), unless the authorization is terminated  or revoked sooner.       Influenza A by PCR NEGATIVE NEGATIVE Final   Influenza B by PCR NEGATIVE NEGATIVE Final    Comment: (NOTE) The Xpert Xpress SARS-CoV-2/FLU/RSV plus assay is intended as an aid in the diagnosis of influenza from Nasopharyngeal swab specimens and should not be used as a sole basis for treatment. Nasal washings and aspirates are unacceptable for Xpert Xpress SARS-CoV-2/FLU/RSV testing.  Fact Sheet for Patients: EntrepreneurPulse.com.au  Fact Sheet for Healthcare Providers: IncredibleEmployment.be  This test is not yet approved or cleared by the [place] FDA and has been authorized for detection and/or diagnosis of SARS-CoV-2 by FDA  under an Emergency Use Authorization (EUA). This EUA will remain in effect (meaning this test can be used) for the duration of the COVID-19 declaration under Section 564(b)(1) of the Act, 21 U.S.C. section 360bbb-3(b)(1), unless the authorization is terminated or revoked.  Performed at [facility], [place] [address], [place], [place] [address]          Radiology Studies: DG Chest 1 View  Result Date: 02/01/2021 CLINICAL DATA:  Witnessed fall EXAM: CHEST  1 VIEW COMPARISON:  12/07/2020 FINDINGS: Capsular calcification at RIGHT breast prosthesis. Upper normal size of cardiac silhouette. Mediastinal contours and pulmonary vascularity normal.  Lungs grossly clear. No pleural effusion or pneumothorax. Diffuse osseous demineralization with dextroconvex thoracic scoliosis. Surgical clips RIGHT axilla. IMPRESSION: No scratch acute abnormalities. Electronically Signed   By: Lavonia Dana M.D.   On: 02/01/2021 16:17   CT Head Wo Contrast  Result Date: 02/01/2021 CLINICAL DATA:  Witnessed fall trying to stand from wheelchair, EMS states patient struck head, denies loss of consciousness EXAM: CT HEAD WITHOUT CONTRAST TECHNIQUE: Contiguous axial images were obtained from the base of the skull through the vertex without intravenous contrast. Sagittal and coronal MPR images reconstructed from axial data set. COMPARISON:  01/09/2021 FINDINGS: Brain: Undo generalized atrophy. Normal ventricular morphology. No midline shift or mass effect. Small vessel chronic ischemic changes of deep cerebral white matter. No intracranial hemorrhage or evidence of acute infarction. Asymmetric prominence of extra-axial fluid at anterior aspect of LEFT middle cranial fossa versus RIGHT, likely represents asymmetric atrophy of the temporal lobes, doubt arachnoid cyst. No acute extra-axial hemorrhage. Vascular: Atherosclerotic calcification at RIGHT vertebral artery. No hyperdense vessels. Skull: Demineralized but intact Sinuses/Orbits: Clear Other: N/A IMPRESSION: Atrophy with small vessel chronic ischemic changes of deep cerebral white matter. No acute intracranial abnormalities. Electronically Signed   By: Lavonia Dana M.D.   On: 02/01/2021 16:23   CT Cervical Spine Wo Contrast  Result Date: 02/01/2021 CLINICAL DATA:  Witnessed fall while trying to stand from wheelchair EXAM: CT CERVICAL SPINE WITHOUT CONTRAST TECHNIQUE: Multidetector CT imaging of the cervical spine was performed without intravenous contrast. Multiplanar CT image reconstructions were also generated. COMPARISON:  01/09/2021 FINDINGS: Alignment: Slight anterolisthesis at C3-C4 and T2-T3 unchanged. Remaining  alignments normal. Skull base and vertebrae: Osseous demineralization. Vertebral body heights maintained. Multilevel disc space narrowing and endplate spur formation throughout cervical spine. Multilevel facet degenerative changes. Skull base intact. No fracture, additional subluxation, or bone destruction. Soft tissues and spinal canal: Prevertebral soft tissues normal thickness. Atherosclerotic calcifications at carotid bifurcations bilaterally as well as at RIGHT vertebral artery. Disc levels:  No specific abnormalities Upper chest: Small BILATERAL pleural effusions. Lung apices otherwise clear. Other: N/A IMPRESSION: Multilevel degenerative disc and facet disease changes of the cervical spine. No acute cervical spine abnormalities. Small BILATERAL pleural effusions. Electronically Signed   By: Lavonia Dana M.D.   On: 02/01/2021 16:29   DG Hip Unilat With Pelvis 2-3 Views Left  Result Date: 02/01/2021 CLINICAL DATA:  Witnessed fall trying to stand from wheelchair, shortening and rotation of LEFT leg EXAM: DG HIP (WITH OR WITHOUT PELVIS) 2-3V LEFT COMPARISON:  None FINDINGS: Osseous demineralization. LEFT hip prosthesis seen. Narrowing of the hip joints bilaterally. SI joints preserved. Pelvis intact. Mildly displaced periprosthetic fracture identified at the proximal LEFT femur without dislocation. IMPRESSION: LEFT hip prosthesis with acute mildly displaced periprosthetic fracture of the proximal LEFT femur. Electronically Signed   By: Lavonia Dana M.D.   On: 02/01/2021 16:16  DG FEMUR MIN 2 VIEWS LEFT  Result Date: 02/01/2021 CLINICAL DATA:  Femur fracture post fall, scheduled for surgery 322 EXAM: LEFT FEMUR 2 VIEWS COMPARISON:  Radiograph 02/01/2021 FINDINGS: Left hip hemiarthroplasty. Dedicated femur images further demonstrate the periprosthetic femur fracture extending from the trochanteric region towards the distal aspect of the femoral stem in the proximal femoral diaphysis. Minimal comminution and  lateral displacement of the dominant fracture fragments. No distal femur fracture is identified. Diffuse soft tissue swelling of the hip and proximal thigh laterally. Tricompartmental degenerative changes noted at the knee, incompletely assessed on this exam. IMPRESSION: Left hip hemiarthroplasty with a periprosthetic left femoral fracture and adjacent soft tissue swelling. Electronically Signed   By: Lovena Le M.D.   On: 02/01/2021 22:23        Scheduled Meds:  atorvastatin  10 mg Oral Daily   cholecalciferol  1,000 Units Oral Q1200   diltiazem  60 mg Oral Q12H   docusate sodium  100 mg Oral BID   enoxaparin (LOVENOX) injection  40 mg Subcutaneous Q24H   feeding supplement  237 mL Oral BID BM   ferrous sulfate  325 mg Oral TID PC   latanoprost  1 drop Both Eyes Q1200   multivitamin with minerals  1 tablet Oral Daily   QUEtiapine  25 mg Oral QHS   senna  1 tablet Oral BID   sodium chloride flush  3 mL Intravenous Q12H   sodium chloride flush  3 mL Intravenous Q12H   timolol  1 drop Both Eyes Q1200   Continuous Infusions:  sodium chloride     cefTRIAXone (ROCEPHIN)  IV 1 g (02/02/21 0816)     LOS: 1 day    Time spent: 58mn  PDomenic Polite MD [facility]   02/02/2021, 12:04 PM

## 2021-02-02 NOTE — TOC CAGE-AID Note (Addendum)
Transition of Care [facility]) - CAGE-AID Screening   Patient Details  Name: Latasha Anderson MRN: [id number] Date of Birth: [birth year]  Transition of [facility]) CM/SW Contact:    Latasha Anderson, [place] Phone Number: 02/02/2021, 10:52 AM   Clinical Narrative: Pt is unable to participate in Cage Aid. Pt is experiencing dementia.   Latasha Anderson, MSW, LCSW-A Pronouns:  She/Her/Hers [facility] Clinical Social Worker Direct Number:  [phone] [email or web address]   CAGE-AID Screening: Substance Abuse Screening unable to be completed due to: : Patient unable to participate

## 2021-02-02 NOTE — Plan of Care (Addendum)
Patient left leg edema +3 pitting, elevated leg. Patient has generalized edema, patient KVO, patient is not very talkative. Rotating patient to prevent sores.   Problem: Health Behavior/Discharge Planning: Goal: Ability to manage health-related needs will improve Outcome: Progressing   Problem: Education: Goal: Knowledge of General Education information will improve Description: Including pain rating scale, medication(s)/side effects and non-pharmacologic comfort measures Outcome: Progressing   Problem: Activity: Goal: Risk for activity intolerance will decrease Outcome: Progressing   Problem: Pain Managment: Goal: General experience of comfort will improve Outcome: Progressing   Problem: Safety: Goal: Ability to remain free from injury will improve Outcome: Progressing   Problem: Skin Integrity: Goal: Risk for impaired skin integrity will decrease Outcome: Progressing

## 2021-02-02 NOTE — Progress Notes (Signed)
Pt removed teley- refusing to have it placed back on- currently on standby

## 2021-02-02 NOTE — Consult Note (Signed)
ORTHOPAEDIC CONSULTATION  REQUESTING PHYSICIAN: Domenic Polite, MD  Chief Complaint: Left periprosthetic hip fracture  HPI: Latasha Anderson is a 87 y.o. female who presents with left periprosthetic fracture status post mechanical fall at the rehab facility.  She is approximately 2 months status post left hip hemiarthroplasty by Dr. Marlou Sa for femoral neck fracture.  She has advanced dementia and is oriented to self only.  Does not answer questions appropriately.  She is impulsive with her walking which led to the fall.  Past Medical History:  Diagnosis Date   (HFpEF) heart failure with preserved ejection fraction ([place])    a. EF 55-65%, January, 2012, question diastolic dysfunction; b. [id number] Echo: EF 55-60%, no rwma. Mild MR. Mildly dil LA. Nl RV fxn. PASP 62mHg.   Breast cancer ([facility] 1985   RT MASTECTOMY   Chest pain    Dyslipidemia    Hypertension    Hypokalemia    February, 2014, potassium started   Mitral regurgitation    Mild, echo, 2012   Permanent atrial fibrillation ([facility]    a. Dx 08/2012. CHA2DSD2VASc = 4-5-->warfarin.   Shingles    Shortness of breath    January, 20000000 normal systolic function, question diastolic dysfunction   Past Surgical History:  Procedure Laterality Date   ABDOMINAL HYSTERECTOMY     age 87  ANTERIOR APPROACH HEMI HIP ARTHROPLASTY Left 12/07/2020   Procedure: ANTERIOR APPROACH HEMI HIP ARTHROPLASTY;  Surgeon: DMeredith Pel MD;  Location: [facility];  Service: Orthopedics;  Laterality: Left;   AUGMENTATION MAMMAPLASTY Right 1985   RT MASTECTOMY FOR BREAST CA   EYE SURGERY     cataract   MASTECTOMY Right 1985   reconstruction inplant as well   Social History   Socioeconomic History   Marital status: Married    Spouse name: Not on file   Number of children: 1   Years of education: Not on file   Highest education level: Bachelor's degree (e.g., BA, AB, BS)  Occupational History   Not on file  Tobacco Use   Smoking status: Never    Smokeless tobacco: Never  Vaping Use   Vaping Use: Never used  Substance and Sexual Activity   Alcohol use: No    Alcohol/week: 0.0 standard drinks   Drug use: No   Sexual activity: Never  Other Topics Concern   Not on file  Social History Narrative   Not on file   Social Determinants of Health   Financial Resource Strain: Not on file  Food Insecurity: Not on file  Transportation Needs: Not on file  Physical Activity: Not on file  Stress: Not on file  Social Connections: Not on file   Family History  Problem Relation Age of Onset   Stroke Mother    Diabetes Father    Cancer Brother        prostate   Breast cancer Neg Hx    - negative except otherwise stated in the family history section Allergies  Allergen Reactions   Ciprofloxacin Nausea And Vomiting   Cortisone Other (See Comments)    Pt stated this makes her turn blue   Nitrofurantoin Monohyd Macro Other (See Comments)    GI upset & chills.   Other Other (See Comments)    Mycins - unknown reaction   Prior to Admission medications   Medication Sig Start Date End Date Taking? Authorizing Provider  acetaminophen (TYLENOL) 500 MG tablet Take 500 mg by mouth every 8 (eight) hours as needed (pain).  Yes [provider]  alendronate (FOSAMAX) 70 MG tablet TAKE 1 TABLET (70 MG TOTAL) BY MOUTH EVERY 7 (SEVEN) DAYS. TAKE WITH A FULL GLASS OF WATER ON AN EMPTY STOMACH. Patient taking differently: Take 70 mg by mouth every Friday. Take with a full glass of water on an empty stomach. 10/22/20  Yes Jerrol Banana., MD  aspirin EC 325 MG EC tablet Take 1 tablet (325 mg total) by mouth daily with breakfast. Patient taking differently: Take 325 mg by mouth daily with lunch. 12/11/20  Yes Meredith Pel, MD  atorvastatin (LIPITOR) 10 MG tablet TAKE 1 TABLET (10 MG TOTAL) BY MOUTH DAILY. PLEASE SCHEDULE AN OFFICE VISIT BEFORE ANYMORE REFILLS. Patient taking differently: Take 10 mg by mouth at bedtime. 01/26/21   Yes Jerrol Banana., MD  cholecalciferol (VITAMIN D3) 25 MCG (1000 UNIT) tablet Take 1,000 Units by mouth daily with lunch.   Yes [provider]  diltiazem (CARDIZEM SR) 60 MG 12 hr capsule Take 60 mg by mouth every 12 (twelve) hours. Hold if BP <110/70   Yes [provider]  docusate sodium (COLACE) 100 MG capsule Take 1 capsule (100 mg total) by mouth 2 (two) times daily. 12/11/20  Yes Bonnielee Haff, MD  feeding supplement (ENSURE SURGERY) LIQD Take 237 mLs by mouth 2 (two) times daily between meals. 12/11/20  Yes Bonnielee Haff, MD  ferrous sulfate 325 (65 FE) MG tablet Take 1 tablet (325 mg total) by mouth 3 (three) times daily after meals. Patient taking differently: Take 325 mg by mouth daily with lunch. 12/11/20  Yes Bonnielee Haff, MD  furosemide (LASIX) 20 MG tablet Take 20 mg by mouth every evening.   Yes [provider]  latanoprost (XALATAN) 0.005 % ophthalmic solution Place 1 drop into both eyes daily with lunch. 08/01/12  Yes [provider]  methocarbamol (ROBAXIN) 500 MG tablet Take 1 tablet (500 mg total) by mouth every 6 (six) hours as needed for muscle spasms. 12/11/20  Yes Bonnielee Haff, MD  Multiple Vitamin (MULTIVITAMIN WITH MINERALS) TABS tablet Take 1 tablet by mouth daily. Patient taking differently: Take 1 tablet by mouth daily with lunch. 12/12/20  Yes Bonnielee Haff, MD  polyethylene glycol (MIRALAX / GLYCOLAX) 17 g packet Take 17 g by mouth daily as needed for mild constipation. 12/11/20  Yes Bonnielee Haff, MD  potassium chloride SA (KLOR-CON) 20 MEQ tablet Take 20 mEq by mouth daily with lunch.   Yes [provider]  QUEtiapine (SEROQUEL) 25 MG tablet Take 1 tablet (25 mg total) by mouth at bedtime. 12/02/20  Yes Fritzi Mandes, MD  senna (SENOKOT) 8.6 MG TABS tablet Take 1 tablet (8.6 mg total) by mouth 2 (two) times daily. 12/11/20  Yes Bonnielee Haff, MD  timolol (TIMOPTIC) 0.5 % ophthalmic solution Place 1 drop into  both eyes daily with lunch. 08/05/12  Yes [provider]  HYDROcodone-acetaminophen (NORCO/VICODIN) 5-325 MG tablet Take 1 tablet by mouth every 6 (six) hours as needed for severe pain (pain score 4-6). Patient not taking: Reported on 02/01/2021 12/11/20   Bonnielee Haff, MD   DG Chest 1 View  Result Date: 02/01/2021 CLINICAL DATA:  Witnessed fall EXAM: CHEST  1 VIEW COMPARISON:  12/07/2020 FINDINGS: Capsular calcification at RIGHT breast prosthesis. Upper normal size of cardiac silhouette. Mediastinal contours and pulmonary vascularity normal. Lungs grossly clear. No pleural effusion or pneumothorax. Diffuse osseous demineralization with dextroconvex thoracic scoliosis. Surgical clips RIGHT axilla. IMPRESSION: No scratch acute abnormalities. Electronically Signed  By: Lavonia Dana M.D.   On: 02/01/2021 16:17   CT Head Wo Contrast  Result Date: 02/01/2021 CLINICAL DATA:  Witnessed fall trying to stand from wheelchair, EMS states patient struck head, denies loss of consciousness EXAM: CT HEAD WITHOUT CONTRAST TECHNIQUE: Contiguous axial images were obtained from the base of the skull through the vertex without intravenous contrast. Sagittal and coronal MPR images reconstructed from axial data set. COMPARISON:  01/09/2021 FINDINGS: Brain: Undo generalized atrophy. Normal ventricular morphology. No midline shift or mass effect. Small vessel chronic ischemic changes of deep cerebral white matter. No intracranial hemorrhage or evidence of acute infarction. Asymmetric prominence of extra-axial fluid at anterior aspect of LEFT middle cranial fossa versus RIGHT, likely represents asymmetric atrophy of the temporal lobes, doubt arachnoid cyst. No acute extra-axial hemorrhage. Vascular: Atherosclerotic calcification at RIGHT vertebral artery. No hyperdense vessels. Skull: Demineralized but intact Sinuses/Orbits: Clear Other: N/A IMPRESSION: Atrophy with small vessel chronic ischemic changes of deep cerebral  white matter. No acute intracranial abnormalities. Electronically Signed   By: Lavonia Dana M.D.   On: 02/01/2021 16:23   CT Cervical Spine Wo Contrast  Result Date: 02/01/2021 CLINICAL DATA:  Witnessed fall while trying to stand from wheelchair EXAM: CT CERVICAL SPINE WITHOUT CONTRAST TECHNIQUE: Multidetector CT imaging of the cervical spine was performed without intravenous contrast. Multiplanar CT image reconstructions were also generated. COMPARISON:  01/09/2021 FINDINGS: Alignment: Slight anterolisthesis at C3-C4 and T2-T3 unchanged. Remaining alignments normal. Skull base and vertebrae: Osseous demineralization. Vertebral body heights maintained. Multilevel disc space narrowing and endplate spur formation throughout cervical spine. Multilevel facet degenerative changes. Skull base intact. No fracture, additional subluxation, or bone destruction. Soft tissues and spinal canal: Prevertebral soft tissues normal thickness. Atherosclerotic calcifications at carotid bifurcations bilaterally as well as at RIGHT vertebral artery. Disc levels:  No specific abnormalities Upper chest: Small BILATERAL pleural effusions. Lung apices otherwise clear. Other: N/A IMPRESSION: Multilevel degenerative disc and facet disease changes of the cervical spine. No acute cervical spine abnormalities. Small BILATERAL pleural effusions. Electronically Signed   By: Lavonia Dana M.D.   On: 02/01/2021 16:29   DG Hip Unilat With Pelvis 2-3 Views Left  Result Date: 02/01/2021 CLINICAL DATA:  Witnessed fall trying to stand from wheelchair, shortening and rotation of LEFT leg EXAM: DG HIP (WITH OR WITHOUT PELVIS) 2-3V LEFT COMPARISON:  None FINDINGS: Osseous demineralization. LEFT hip prosthesis seen. Narrowing of the hip joints bilaterally. SI joints preserved. Pelvis intact. Mildly displaced periprosthetic fracture identified at the proximal LEFT femur without dislocation. IMPRESSION: LEFT hip prosthesis with acute mildly displaced  periprosthetic fracture of the proximal LEFT femur. Electronically Signed   By: Lavonia Dana M.D.   On: 02/01/2021 16:16   DG FEMUR MIN 2 VIEWS LEFT  Result Date: 02/01/2021 CLINICAL DATA:  Femur fracture post fall, scheduled for surgery 322 EXAM: LEFT FEMUR 2 VIEWS COMPARISON:  Radiograph 02/01/2021 FINDINGS: Left hip hemiarthroplasty. Dedicated femur images further demonstrate the periprosthetic femur fracture extending from the trochanteric region towards the distal aspect of the femoral stem in the proximal femoral diaphysis. Minimal comminution and lateral displacement of the dominant fracture fragments. No distal femur fracture is identified. Diffuse soft tissue swelling of the hip and proximal thigh laterally. Tricompartmental degenerative changes noted at the knee, incompletely assessed on this exam. IMPRESSION: Left hip hemiarthroplasty with a periprosthetic left femoral fracture and adjacent soft tissue swelling. Electronically Signed   By: Lovena Le M.D.   On: 02/01/2021 22:23   - pertinent xrays, CT,  MRI studies were reviewed and independently interpreted  Positive ROS: All other systems have been reviewed and were otherwise negative with the exception of those mentioned in the HPI and as above.  Physical Exam: General: No acute distress Cardiovascular: No pedal edema Respiratory: No cyanosis, no use of accessory musculature GI: No organomegaly, abdomen is soft and non-tender Skin: No lesions in the area of chief complaint Neurologic: Sensation intact distally Psychiatric: Patient is at baseline mood and affect Lymphatic: No axillary or cervical lymphadenopathy  MUSCULOSKELETAL:  Pain with attempted movement of the leg.  Skin intact.  No neurovascular compromise distally.  Edema within the foot and ankle which has been present since the initial surgery.  Assessment: Left periprosthetic hip fracture with likely loose implant  Plan: I reviewed the x-rays and findings with her  daughter and husband at bedside today.  I also spoke with her on the phone last night.  After our discussion about treatment options they have elected to proceed with ORIF with likely revision of the implant tomorrow.  Risks including infection, dislocation, hardware irritation, neurovascular injury reviewed with the family.  They understand that she may not be able to recover from this event and as a result may not ever walk again.  Goal of the surgery is to provide quality of life and pain relief as well as early mobilization.  Questions encouraged and answered.  Thank you for the consult and the opportunity to see Ms. Hiltunen  N. Eduard Roux, MD [facility] 12:19 PM

## 2021-02-02 NOTE — TOC Progression Note (Signed)
Transition of Care [facility]) - Progression Note    Patient Details  Name: Latasha Anderson MRN: [id number] Date of Birth: 07-06-1930  Transition of Care [facility]) CM/SW Contact  Milinda Antis, [place] Phone Number: 02/02/2021, 3:46 PM  Clinical Narrative:     15:38-  CSW contacted [facility] to inquire about what would be needed for the patient to return.  There was no answer.  CSW left a VM requesting a returned call.       Expected Discharge Plan and Services                                                 Social Determinants of Health (SDOH) Interventions    Readmission Risk Interventions No flowsheet data found.

## 2021-02-03 ENCOUNTER — Inpatient Hospital Stay (HOSPITAL_COMMUNITY): Payer: Medicare PPO | Admitting: Anesthesiology

## 2021-02-03 ENCOUNTER — Inpatient Hospital Stay (HOSPITAL_COMMUNITY): Payer: Medicare PPO

## 2021-02-03 ENCOUNTER — Encounter (HOSPITAL_COMMUNITY)
Admission: EM | Disposition: A | Discharge status: Skilled Nursing Facility | Payer: Self-pay | Source: Home / Self Care | Attending: Internal Medicine

## 2021-02-03 ENCOUNTER — Encounter (HOSPITAL_COMMUNITY): Payer: Self-pay | Admitting: Internal Medicine

## 2021-02-03 DIAGNOSIS — S72115A Nondisplaced fracture of greater trochanter of left femur, initial encounter for closed fracture: Secondary | ICD-10-CM | POA: Diagnosis not present

## 2021-02-03 DIAGNOSIS — S7222XA Displaced subtrochanteric fracture of left femur, initial encounter for closed fracture: Secondary | ICD-10-CM

## 2021-02-03 DIAGNOSIS — M9702XA Periprosthetic fracture around internal prosthetic left hip joint, initial encounter: Secondary | ICD-10-CM

## 2021-02-03 HISTORY — PX: TOTAL HIP REVISION: SHX763

## 2021-02-03 LAB — POCT I-STAT, CHEM 8
BUN: 17 mg/dL (ref 8–23)
Calcium, Ion: 1.11 mmol/L — ABNORMAL LOW (ref 1.15–1.40)
Chloride: 99 mmol/L (ref 98–111)
Creatinine, Ser: 0.3 mg/dL — ABNORMAL LOW (ref 0.44–1.00)
Glucose, Bld: 143 mg/dL — ABNORMAL HIGH (ref 70–99)
HCT: 24 % — ABNORMAL LOW (ref 36.0–46.0)
Hemoglobin: 8.2 g/dL — ABNORMAL LOW (ref 12.0–15.0)
Potassium: 4.5 mmol/L (ref 3.5–5.1)
Sodium: 136 mmol/L (ref 135–145)
TCO2: 28 mmol/L (ref 22–32)

## 2021-02-03 LAB — POCT I-STAT EG7
Acid-Base Excess: 2 mmol/L (ref 0.0–2.0)
Acid-base deficit: 1 mmol/L (ref 0.0–2.0)
Bicarbonate: 26.8 mmol/L (ref 20.0–28.0)
Bicarbonate: 26.4 mmol/L (ref 20.0–28.0)
Calcium, Ion: 1.19 mmol/L (ref 1.15–1.40)
Calcium, Ion: 1.11 mmol/L — ABNORMAL LOW (ref 1.15–1.40)
HCT: 22 % — ABNORMAL LOW (ref 36.0–46.0)
HCT: 25 % — ABNORMAL LOW (ref 36.0–46.0)
Hemoglobin: 7.5 g/dL — ABNORMAL LOW (ref 12.0–15.0)
Hemoglobin: 8.5 g/dL — ABNORMAL LOW (ref 12.0–15.0)
O2 Saturation: 90 %
O2 Saturation: 68 %
Potassium: 4.1 mmol/L (ref 3.5–5.1)
Potassium: 4.1 mmol/L (ref 3.5–5.1)
Sodium: 134 mmol/L — ABNORMAL LOW (ref 135–145)
Sodium: 137 mmol/L (ref 135–145)
TCO2: 28 mmol/L (ref 22–32)
TCO2: 28 mmol/L (ref 22–32)
pCO2, Ven: 40.4 mmHg — ABNORMAL LOW (ref 44.0–60.0)
pCO2, Ven: 56 mmHg (ref 44.0–60.0)
pH, Ven: 7.43 (ref 7.250–7.430)
pH, Ven: 7.282 (ref 7.250–7.430)
pO2, Ven: 57 mmHg — ABNORMAL HIGH (ref 32.0–45.0)
pO2, Ven: 40 mmHg (ref 32.0–45.0)

## 2021-02-03 LAB — PREPARE RBC (CROSSMATCH)

## 2021-02-03 LAB — CBC
HCT: 27.5 % — ABNORMAL LOW (ref 36.0–46.0)
Hemoglobin: 9 g/dL — ABNORMAL LOW (ref 12.0–15.0)
MCH: 29.9 pg (ref 26.0–34.0)
MCHC: 32.7 g/dL (ref 30.0–36.0)
MCV: 91.4 fL (ref 80.0–100.0)
Platelets: 232 10*3/uL (ref 150–400)
RBC: 3.01 MIL/uL — ABNORMAL LOW (ref 3.87–5.11)
RDW: 14.3 % (ref 11.5–15.5)
WBC: 10.5 10*3/uL (ref 4.0–10.5)
nRBC: 0 % (ref 0.0–0.2)

## 2021-02-03 LAB — BASIC METABOLIC PANEL
Anion gap: 7 (ref 5–15)
BUN: 17 mg/dL (ref 8–23)
CO2: 28 mmol/L (ref 22–32)
Calcium: 8.5 mg/dL — ABNORMAL LOW (ref 8.9–10.3)
Chloride: 99 mmol/L (ref 98–111)
Creatinine, Ser: 0.41 mg/dL — ABNORMAL LOW (ref 0.44–1.00)
GFR, Estimated: >60 mL/min (ref >60)
Glucose, Bld: 126 mg/dL — ABNORMAL HIGH (ref 70–99)
Potassium: 3.5 mmol/L (ref 3.5–5.1)
Sodium: 134 mmol/L — ABNORMAL LOW (ref 135–145)

## 2021-02-03 SURGERY — TOTAL HIP REVISION
Anesthesia: General | Site: Hip | Laterality: Left

## 2021-02-03 MED ORDER — HYDROMORPHONE HCL 1 MG/ML IJ SOLN: 0.5000 mg | INTRAMUSCULAR | Status: DC | PRN

## 2021-02-03 MED ORDER — OXYCODONE HCL 5 MG PO TABS
5.0000 mg | ORAL_TABLET | ORAL | Status: DC | PRN
  Filled 2021-02-03: qty 1

## 2021-02-03 MED ORDER — ONDANSETRON HCL 4 MG/2ML IJ SOLN: 4.0000 mg | Freq: Four times a day (QID) | INTRAMUSCULAR | Status: DC | PRN

## 2021-02-03 MED ORDER — LACTATED RINGERS IV SOLN: INTRAVENOUS | Status: DC

## 2021-02-03 MED ORDER — ONDANSETRON HCL 4 MG/2ML IJ SOLN
4.0000 mg | Freq: Four times a day (QID) | INTRAMUSCULAR | Status: DC | PRN
Start: 2021-02-03 — End: 2021-02-03

## 2021-02-03 MED ORDER — LACTATED RINGERS IV SOLN: INTRAVENOUS | Status: DC | PRN

## 2021-02-03 MED ORDER — SORBITOL 70 % SOLN
30.0000 mL | Freq: Every day | Status: DC | PRN
  Filled 2021-02-03: qty 30

## 2021-02-03 MED ORDER — PHENOL 1.4 % MT LIQD: 1.0000 | OROMUCOSAL | Status: DC | PRN

## 2021-02-03 MED ORDER — PROPOFOL 10 MG/ML IV BOLUS
INTRAVENOUS | Status: DC | PRN
  Administered 2021-02-03: 100 mg via INTRAVENOUS

## 2021-02-03 MED ORDER — 0.9 % SODIUM CHLORIDE (POUR BTL) OPTIME
TOPICAL | Status: DC | PRN
  Administered 2021-02-03: 1000 mL

## 2021-02-03 MED ORDER — FENTANYL CITRATE (PF) 100 MCG/2ML IJ SOLN
INTRAMUSCULAR | Status: DC | PRN
  Administered 2021-02-03 (×2): 50 ug via INTRAVENOUS

## 2021-02-03 MED ORDER — ONDANSETRON HCL 4 MG/2ML IJ SOLN
INTRAMUSCULAR | Status: DC | PRN
  Administered 2021-02-03: 4 mg via INTRAVENOUS

## 2021-02-03 MED ORDER — METHOCARBAMOL 1000 MG/10ML IJ SOLN
500.0000 mg | Freq: Four times a day (QID) | INTRAVENOUS | Status: DC | PRN
  Filled 2021-02-03: qty 5

## 2021-02-03 MED ORDER — SODIUM CHLORIDE 0.9 % IV SOLN: INTRAVENOUS | Status: DC

## 2021-02-03 MED ORDER — ALUM & MAG HYDROXIDE-SIMETH 200-200-20 MG/5ML PO SUSP: 30.0000 mL | ORAL | Status: DC | PRN

## 2021-02-03 MED ORDER — FENTANYL CITRATE (PF) 100 MCG/2ML IJ SOLN
INTRAMUSCULAR | Status: AC
  Administered 2021-02-03: 25 ug
  Filled 2021-02-03: qty 2

## 2021-02-03 MED ORDER — PROPOFOL 10 MG/ML IV BOLUS
INTRAVENOUS | Status: AC
  Filled 2021-02-03: qty 20

## 2021-02-03 MED ORDER — ENOXAPARIN SODIUM 40 MG/0.4ML IJ SOSY
40.0000 mg | PREFILLED_SYRINGE | INTRAMUSCULAR | Status: DC
  Administered 2021-02-04 – 2021-02-07 (×4): 40 mg via SUBCUTANEOUS
  Filled 2021-02-03 (×4): qty 0.4

## 2021-02-03 MED ORDER — PHENYLEPHRINE HCL-NACL 20-0.9 MG/250ML-% IV SOLN
INTRAVENOUS | Status: DC | PRN
  Administered 2021-02-03: 50 ug/min via INTRAVENOUS

## 2021-02-03 MED ORDER — LIDOCAINE 2% (20 MG/ML) 5 ML SYRINGE
INTRAMUSCULAR | Status: DC | PRN
  Administered 2021-02-03: 60 mg via INTRAVENOUS

## 2021-02-03 MED ORDER — ALBUMIN HUMAN 5 % IV SOLN: INTRAVENOUS | Status: DC | PRN

## 2021-02-03 MED ORDER — CHLORHEXIDINE GLUCONATE 0.12 % MT SOLN
15.0000 mL | Freq: Once | OROMUCOSAL | Status: AC
  Administered 2021-02-03: 15 mL via OROMUCOSAL
  Filled 2021-02-03: qty 15

## 2021-02-03 MED ORDER — VANCOMYCIN HCL 1000 MG IV SOLR
INTRAVENOUS | Status: AC
  Filled 2021-02-03: qty 1000

## 2021-02-03 MED ORDER — POLYETHYLENE GLYCOL 3350 17 G PO PACK: 17.0000 g | PACK | Freq: Every day | ORAL | Status: DC | PRN

## 2021-02-03 MED ORDER — PHENYLEPHRINE 40 MCG/ML (10ML) SYRINGE FOR IV PUSH (FOR BLOOD PRESSURE SUPPORT)
PREFILLED_SYRINGE | INTRAVENOUS | Status: DC | PRN
  Administered 2021-02-03: 120 ug via INTRAVENOUS

## 2021-02-03 MED ORDER — OXYCODONE HCL 5 MG PO TABS: 5.0000 mg | ORAL_TABLET | Freq: Once | ORAL | Status: DC | PRN

## 2021-02-03 MED ORDER — METHOCARBAMOL 500 MG PO TABS
500.0000 mg | ORAL_TABLET | Freq: Four times a day (QID) | ORAL | Status: DC | PRN
  Administered 2021-02-08: 500 mg via ORAL
  Filled 2021-02-03: qty 1

## 2021-02-03 MED ORDER — ACETAMINOPHEN 325 MG PO TABS
325.0000 mg | ORAL_TABLET | Freq: Four times a day (QID) | ORAL | Status: DC | PRN
  Administered 2021-02-07: 650 mg via ORAL
  Filled 2021-02-03: qty 2

## 2021-02-03 MED ORDER — ACETAMINOPHEN 10 MG/ML IV SOLN
INTRAVENOUS | Status: AC
  Filled 2021-02-03: qty 100

## 2021-02-03 MED ORDER — DOCUSATE SODIUM 100 MG PO CAPS
100.0000 mg | ORAL_CAPSULE | Freq: Two times a day (BID) | ORAL | Status: DC
  Administered 2021-02-03 – 2021-02-09 (×12): 100 mg via ORAL
  Filled 2021-02-03 (×13): qty 1

## 2021-02-03 MED ORDER — MENTHOL 3 MG MT LOZG: 1.0000 | LOZENGE | OROMUCOSAL | Status: DC | PRN

## 2021-02-03 MED ORDER — PHENYLEPHRINE HCL (PRESSORS) 10 MG/ML IV SOLN
INTRAVENOUS | Status: DC | PRN
  Administered 2021-02-03 (×2): 80 ug via INTRAVENOUS
  Administered 2021-02-03: 40 ug via INTRAVENOUS
  Administered 2021-02-03 (×2): 80 ug via INTRAVENOUS

## 2021-02-03 MED ORDER — PHENYLEPHRINE 40 MCG/ML (10ML) SYRINGE FOR IV PUSH (FOR BLOOD PRESSURE SUPPORT)
PREFILLED_SYRINGE | INTRAVENOUS | Status: AC
  Filled 2021-02-03: qty 30

## 2021-02-03 MED ORDER — SODIUM CHLORIDE 0.9 % IV SOLN: INTRAVENOUS | Status: DC | PRN

## 2021-02-03 MED ORDER — SUGAMMADEX SODIUM 200 MG/2ML IV SOLN
INTRAVENOUS | Status: DC | PRN
  Administered 2021-02-03: 200 mg via INTRAVENOUS

## 2021-02-03 MED ORDER — ACETAMINOPHEN 500 MG PO TABS
1000.0000 mg | ORAL_TABLET | Freq: Four times a day (QID) | ORAL | Status: AC
  Administered 2021-02-04 (×3): 1000 mg via ORAL
  Filled 2021-02-03 (×3): qty 2

## 2021-02-03 MED ORDER — ACETAMINOPHEN 10 MG/ML IV SOLN
INTRAVENOUS | Status: DC | PRN
  Administered 2021-02-03: 1000 mg via INTRAVENOUS

## 2021-02-03 MED ORDER — ORAL CARE MOUTH RINSE: 15.0000 mL | Freq: Once | OROMUCOSAL | Status: AC

## 2021-02-03 MED ORDER — FENTANYL CITRATE (PF) 250 MCG/5ML IJ SOLN
INTRAMUSCULAR | Status: AC
  Filled 2021-02-03: qty 5

## 2021-02-03 MED ORDER — FENTANYL CITRATE (PF) 100 MCG/2ML IJ SOLN: 25.0000 ug | INTRAMUSCULAR | Status: DC | PRN

## 2021-02-03 MED ORDER — OXYCODONE HCL 5 MG/5ML PO SOLN
5.0000 mg | Freq: Once | ORAL | Status: DC | PRN
Start: 2021-02-03 — End: 2021-02-03

## 2021-02-03 MED ORDER — CEFAZOLIN SODIUM-DEXTROSE 2-4 GM/100ML-% IV SOLN
2.0000 g | Freq: Four times a day (QID) | INTRAVENOUS | Status: AC
  Administered 2021-02-04 (×3): 2 g via INTRAVENOUS
  Filled 2021-02-03 (×3): qty 100

## 2021-02-03 MED ORDER — VANCOMYCIN HCL 1000 MG IV SOLR
INTRAVENOUS | Status: DC | PRN
  Administered 2021-02-03: 1000 mg via TOPICAL

## 2021-02-03 MED ORDER — ROCURONIUM BROMIDE 10 MG/ML (PF) SYRINGE
PREFILLED_SYRINGE | INTRAVENOUS | Status: DC | PRN
  Administered 2021-02-03 (×2): 30 mg via INTRAVENOUS
  Administered 2021-02-03: 60 mg via INTRAVENOUS

## 2021-02-03 MED ORDER — TRANEXAMIC ACID 1000 MG/10ML IV SOLN: INTRAVENOUS | Status: DC | PRN

## 2021-02-03 MED ORDER — TRANEXAMIC ACID-NACL 1000-0.7 MG/100ML-% IV SOLN
1000.0000 mg | Freq: Once | INTRAVENOUS | Status: AC
  Administered 2021-02-03: 1000 mg via INTRAVENOUS
  Filled 2021-02-03: qty 100

## 2021-02-03 MED ORDER — OXYCODONE HCL 5 MG PO TABS: 10.0000 mg | ORAL_TABLET | ORAL | Status: DC | PRN

## 2021-02-03 MED ORDER — TRANEXAMIC ACID-NACL 1000-0.7 MG/100ML-% IV SOLN
INTRAVENOUS | Status: AC
  Filled 2021-02-03: qty 100

## 2021-02-03 MED ORDER — ONDANSETRON HCL 4 MG PO TABS: 4.0000 mg | ORAL_TABLET | Freq: Four times a day (QID) | ORAL | Status: DC | PRN

## 2021-02-03 SURGICAL SUPPLY — 71 items
BAG COUNTER SPONGE SURGICOUNT (BAG) ×2 IMPLANT
BLADE CLIPPER SURG (BLADE) ×2 IMPLANT
BRUSH FEMORAL CANAL (MISCELLANEOUS) IMPLANT
CABLE CERLAGE W/CRIMP 1.8 (Cable) ×6 IMPLANT
CABLE READY CERCLAGE W/CRIP (Trauma Fixation) ×2 IMPLANT
CANISTER WOUND CARE 500ML ATS (WOUND CARE) ×1 IMPLANT
COVER SURGICAL LIGHT HANDLE (MISCELLANEOUS) ×2 IMPLANT
DRAPE HALF SHEET 40X57 (DRAPES) ×4 IMPLANT
DRAPE HIP W/POCKET STRL (MISCELLANEOUS) ×2 IMPLANT
DRAPE INCISE IOBAN 66X45 STRL (DRAPES) ×2 IMPLANT
DRAPE ORTHO SPLIT 77X108 STRL (DRAPES) ×2
DRAPE SURG ORHT 6 SPLT 77X108 (DRAPES) ×2 IMPLANT
DRAPE U-SHAPE 47X51 STRL (DRAPES) ×2 IMPLANT
DRESSING PREVENA PLUS CUSTOM (GAUZE/BANDAGES/DRESSINGS) IMPLANT
DRSG AQUACEL AG ADV 3.5X10 (GAUZE/BANDAGES/DRESSINGS) IMPLANT
DRSG AQUACEL AG ADV 3.5X14 (GAUZE/BANDAGES/DRESSINGS) IMPLANT
DRSG PREVENA PLUS CUSTOM (GAUZE/BANDAGES/DRESSINGS) ×2
DURAPREP 26ML APPLICATOR (WOUND CARE) ×6 IMPLANT
ELECT BLADE 6.5 EXT (BLADE) IMPLANT
ELECT CAUTERY BLADE 6.4 (BLADE) IMPLANT
ELECT REM PT RETURN 9FT ADLT (ELECTROSURGICAL) ×2
ELECTRODE REM PT RTRN 9FT ADLT (ELECTROSURGICAL) ×1 IMPLANT
EVACUATOR 1/8 PVC DRAIN (DRAIN) IMPLANT
FEMORAL MOD ARCOS 70MM SZB (Screw) ×1 IMPLANT
FILTER STRAW FLUID ASPIR (MISCELLANEOUS) ×2 IMPLANT
GLOVE SURG LTX SZ6.5 (GLOVE) ×4 IMPLANT
GLOVE SURG NEOP MICRO LF SZ7.5 (GLOVE) ×2 IMPLANT
GLOVE SURG SYN 7.5  E (GLOVE) ×2
GLOVE SURG SYN 7.5 E (GLOVE) ×2 IMPLANT
GLOVE SURG SYN 7.5 PF PI (GLOVE) ×2 IMPLANT
GLOVE SURG UNDER POLY LF SZ7 (GLOVE) ×10 IMPLANT
HANDPIECE INTERPULSE COAX TIP (DISPOSABLE)
HEAD MOD COCR 28MM HD -3MM NK (Orthopedic Implant) ×1 IMPLANT
HOOD PEEL AWAY FLYTE STAYCOOL (MISCELLANEOUS) ×6 IMPLANT
KIT BASIN OR (CUSTOM PROCEDURE TRAY) ×2 IMPLANT
KIT TURNOVER KIT B (KITS) ×2 IMPLANT
MANIFOLD NEPTUNE II (INSTRUMENTS) ×2 IMPLANT
NDL SPNL 18GX3.5 QUINCKE PK (NEEDLE) ×1 IMPLANT
NEEDLE SPNL 18GX3.5 QUINCKE PK (NEEDLE) ×2 IMPLANT
NS IRRIG 1000ML POUR BTL (IV SOLUTION) ×2 IMPLANT
PACK TOTAL JOINT (CUSTOM PROCEDURE TRAY) ×2 IMPLANT
PAD ARMBOARD 7.5X6 YLW CONV (MISCELLANEOUS) ×4 IMPLANT
PASSER SUT SWANSON 36MM LOOP (INSTRUMENTS) ×2 IMPLANT
PLATE TROCHANTER 64 LEFT NARRW (Plate) ×1 IMPLANT
RINGBLOC BI POLAR 28X46MM (Orthopedic Implant) ×2 IMPLANT
SEALER BIPOLAR AQUA 6.0 (INSTRUMENTS) ×1 IMPLANT
SET HNDPC FAN SPRY TIP SCT (DISPOSABLE) IMPLANT
SHELL RINGBLOC BI POLR 28X46MM (Orthopedic Implant) IMPLANT
SPONGE T-LAP 18X18 ~~LOC~~+RFID (SPONGE) ×1 IMPLANT
STAPLER VISISTAT 35W (STAPLE) ×2 IMPLANT
STEM FEM ARCOS 14X190MM (Stem) ×1 IMPLANT
SUT ETHIBOND NAB CT1 #1 30IN (SUTURE) ×2 IMPLANT
SUT ETHILON 2 0 PSLX (SUTURE) IMPLANT
SUT PDS AB 0 CT 36 (SUTURE) ×2 IMPLANT
SUT PDS AB 1 CT  36 (SUTURE) ×1
SUT PDS AB 1 CT 36 (SUTURE) ×1 IMPLANT
SUT VIC AB 0 CT1 27 (SUTURE) ×2
SUT VIC AB 0 CT1 27XBRD ANBCTR (SUTURE) IMPLANT
SUT VIC AB 1 CT1 27 (SUTURE) ×4
SUT VIC AB 1 CT1 27XBRD ANBCTR (SUTURE) IMPLANT
SUT VIC AB 1 CT1 27XBRD ANTBC (SUTURE) ×2 IMPLANT
SUT VIC AB 2-0 CT1 27 (SUTURE) ×3
SUT VIC AB 2-0 CT1 TAPERPNT 27 (SUTURE) ×2 IMPLANT
SYR 50ML LL SCALE MARK (SYRINGE) ×2 IMPLANT
SYR TB 1ML LUER SLIP (SYRINGE) ×2 IMPLANT
TOWEL GREEN STERILE (TOWEL DISPOSABLE) ×2 IMPLANT
TOWEL GREEN STERILE FF (TOWEL DISPOSABLE) ×2 IMPLANT
TOWER CARTRIDGE SMART MIX (DISPOSABLE) IMPLANT
TRAY FOL W/BAG SLVR 16FR STRL (SET/KITS/TRAYS/PACK) IMPLANT
TRAY FOLEY W/BAG SLVR 16FR LF (SET/KITS/TRAYS/PACK)
WATER STERILE IRR 1000ML POUR (IV SOLUTION) ×6 IMPLANT

## 2021-02-03 NOTE — Anesthesia Preprocedure Evaluation (Addendum)
Anesthesia Evaluation  Patient identified by MRN, date of birth, ID band Patient awake    Reviewed: Allergy & Precautions, NPO status , Patient's Chart, lab work & pertinent test results  Airway Mallampati: II  TM Distance: >3 FB Neck ROM: Full    Dental  (+) Teeth Intact, Poor Dentition, Dental Advisory Given   Pulmonary neg pulmonary ROS,    Pulmonary exam normal breath sounds clear to auscultation       Cardiovascular hypertension, pulmonary hypertension (mild pHTN)Normal cardiovascular exam+ dysrhythmias Atrial Fibrillation + Valvular Problems/Murmurs (mild MR) MR  Rhythm:Regular Rate:Normal  Echo 2019: - Left ventricle: The cavity size was normal. Systolic function was  normal. The estimated ejection fraction was in the range of 55%  to 60%. Wall motion was normal; there were no regional wall  motion abnormalities. The study is not technically sufficient to  allow evaluation of LV diastolic function.  - Mitral valve: There was mild regurgitation.  - Left atrium: The atrium was mildly dilated.  - Right ventricle: Systolic function was normal.  - Pulmonary arteries: Systolic pressure was mildly elevated. PA  peak pressure: 42 mm Hg (S).    Neuro/Psych PSYCHIATRIC DISORDERS Dementia negative neurological ROS     GI/Hepatic Neg liver ROS, GERD  ,  Endo/Other  negative endocrine ROS  Renal/GU negative Renal ROS  negative genitourinary   Musculoskeletal  (+) Arthritis , Osteoarthritis,  L femur fx approximately 2 months status post left hip hemiarthroplasty by Dr. Marlou Sa for femoral neck fracture.   Abdominal   Peds  Hematology negative hematology ROS (+)   Anesthesia Other Findings   Reproductive/Obstetrics negative OB ROS                           Anesthesia Physical Anesthesia Plan  ASA: 3  Anesthesia Plan: General   Post-op Pain Management:    Induction:   PONV Risk  Score and Plan: 2 and Ondansetron, Dexamethasone and Treatment may vary due to age or medical condition  Airway Management Planned: Oral ETT  Additional Equipment: None  Intra-op Plan:   Post-operative Plan: Extubation in OR  Informed Consent: I have reviewed the patients History and Physical, chart, labs and discussed the procedure including the risks, benefits and alternatives for the proposed anesthesia with the patient or authorized representative who has indicated his/her understanding and acceptance.   Patient has DNR.  Discussed DNR with patient, Suspend DNR and Discussed DNR with power of attorney.   Dental advisory given and Consent reviewed with POA  Plan Discussed with: CRNA  Anesthesia Plan Comments: (Pt w/ significant dementia. Plan and DNR d/w husband at bedside- suspend DNR intraoperatively)     Anesthesia Quick Evaluation

## 2021-02-03 NOTE — Anesthesia Procedure Notes (Signed)
Procedure Name: Intubation Date/Time: 02/03/2021 1:59 PM Performed by: Terrence Dupont, CRNA Pre-anesthesia Checklist: Patient identified, Emergency Drugs available, Suction available and Patient being monitored Patient Re-evaluated:Patient Re-evaluated prior to induction Oxygen Delivery Method: Circle system utilized Preoxygenation: Pre-oxygenation with 100% oxygen Induction Type: IV induction Ventilation: Mask ventilation without difficulty Laryngoscope Size: Glidescope and 4 Grade View: Grade I Tube type: Oral Tube size: 7.0 mm Number of attempts: 1 Airway Equipment and Method: Stylet and Oral airway Placement Confirmation: ETT inserted through vocal cords under direct vision, positive ETCO2 and breath sounds checked- equal and bilateral Tube secured with: Tape Dental Injury: Teeth and Oropharynx as per pre-operative assessment

## 2021-02-03 NOTE — Progress Notes (Signed)
Patient left floor to OR with OR staff. Family at bedside.

## 2021-02-03 NOTE — Transfer of Care (Signed)
Immediate Anesthesia Transfer of Care Note  Patient: Latasha Anderson  Procedure(s) Performed: ORIF OF LEFT POSTERIOR FEMUR FRACTURE & REVISION OF STEM (Left: Hip)  Patient Location: PACU  Anesthesia Type:General  Level of Consciousness: drowsy and patient cooperative  Airway & Oxygen Therapy: Patient Spontanous Breathing and Patient connected to nasal cannula oxygen  Post-op Assessment: Report given to RN and Post -op Vital signs reviewed and stable  Post vital signs: Reviewed and stable  Last Vitals:  Vitals Value Taken Time  BP    Temp    Pulse    Resp    SpO2      Last Pain:  Vitals:   02/03/21 1254  TempSrc: Oral  PainSc:          Complications: No notable events documented.

## 2021-02-03 NOTE — Op Note (Signed)
Date of Surgery: 02/03/2021  INDICATIONS: Latasha Anderson is a 87 y.o.-year-old female with a left periprosthetic hip fracture after a mechanical fall.  The  patient's spouse  did consent to the procedure after discussion of the risks and benefits.  PREOPERATIVE DIAGNOSIS:  Left periprosthetic greater trochanter fracture Left periprosthetic subtrochanteric fracture Left periprosthetic femoral shaft fracture Previous left hip bipolar hemiarthroplasty with loss of fixation  POSTOPERATIVE DIAGNOSIS: Same.  PROCEDURE:  Open reduction internal fixation of left greater trochanter fracture Open reduction internal fixation of left subtrochanteric fracture using separate fixation Open reduction internal fixation of left femoral shaft fracture using separate fixation Removal of left hip hemiarthroplasty Revision left hip hemiarthroplasty Application of incisional wound VAC  SURGEON: N. Eduard Roux, M.D.  ASSIST: Ciro Backer [place], [place]; necessary for the timely completion of procedure and due to complexity of procedure.  ANESTHESIA:  general  IV FLUIDS AND URINE: See anesthesia.  ESTIMATED BLOOD LOSS: 1200 mL.  IMPLANTS:  [employer] ARCOS Revision modular hip stem, STS distal stem 14 x 190, Standard [employer] Proximal body B 70 mm, 46/28 mm bipolar head -3 [employer] cerclage cables x 8  DRAINS: Incisional VAC  COMPLICATIONS: see description of procedure.  DESCRIPTION OF PROCEDURE: The patient was brought to the operating room.  The patient had been signed prior to the procedure and this was documented. The patient had the anesthesia placed by the anesthesiologist.  A time-out was performed to confirm that this was the correct patient, site, side and location. The patient did receive antibiotics prior to the incision and was re-dosed during the procedure as needed at indicated intervals.  The patient was then placed in the right lateral decubitus position on a pegboard with all bony  prominences well-padded.  The patient had the operative extremity prepped and draped in the standard surgical fashion.    A long vertical incision was made over the lateral aspect of the left hip extending down towards the femoral shaft.  Dissection was carried down through the subcutaneous tissue onto the IT band which was then split in line with the incision.  There was fracture hematoma that was evacuated.  Standard posterior approach to the left hip was utilized and this was extended distally.  A subvastus approach was utilized distally.  Perforators were cauterized.  Once the femoral shaft and the proximal femur were adequately exposed I was able to identify all the fractures.  She had 3 separate fractures.  She had a longitudinal fracture through the greater trochanter.  She had a large anterior butterfly fragment in the subtrochanteric region and a nondisplaced vertical split in the femoral shaft.  Given the characteristic of the fractures each fracture had to be dealt with separately and needed a separate fixation.  I first dislocated the hip prosthesis and carefully tamped the bipolar head off of the trunnion.  I was then able to book open the subtrochanteric fracture through which I was able to remove the hip stem which had lost fixation and obviously loose.  Once this was done I then turned my attention to first performing internal fixation of the subtrochanteric fracture.  By using traction and rotation I was able to manipulate the subtrochanteric fracture into relative alignment and I then used a tenaculum clamp to anatomically reduce this.  I was able to restore the tube and I placed 2 cerclage cables around this fracture.  The cables were tensioned.  The greater trochanter fracture could not be reduced before revising the stem felt  that the best thing to do was to first revise the femoral stem and then reduced the greater trochanter back to the proximal body of the stem.  Soft tissue attachments were  left intact to the greater trochanter fracture fragments.  The leg was then internally rotated for preparation of the femoral canal.  I sequentially hand reamed the canal up to a 14 mm x 190 mm stem.  The stem allowed me to bypass the distal extent of the fracture by more than 2-1/2 cortical widths.  The final stem was then impacted down to the appropriate depth.  I then trialed different modular proximal bodies until I found that the 70 mm B body gave the appropriate fit with restoration of hip mechanics.  The proximal femoral area was gently reamed for preparation.  We then trialed multiple 46 mm bipolar heads until we found that the -3 head allowed restoration of hip anatomy.  Shuck test was normal.  I was able to internally rotate the hip to greater than 60 degrees before dislocated.  Leg lengths were equal clinically.  The trial proximal body was removed and the final 1 was placed onto the distal stem with the appropriate version.  The final bipolar head was also placed and the hip was reduced.  I was very happy with the final result.  I then checked distally and found that a nondisplaced vertical femoral shaft fracture had been created by placement of the stem.  I then passed to cerclage cables around this nondisplaced fracture.  The cables were tensioned and anatomically reduced the fracture.  Finally I turned my attention to surgically repairing the greater trochanter fracture.  Given the poor quality of the fracture I did not feel that I could get adequate fixation with screws therefore I passed 3 cerclage cables around the implant and the greater trochanter fracture fragments.  The fracture was reduced with tensioning of the cables.  I then decided for extra fixation to put a third cable around the subtrochanteric fracture.  Gentle range of motion of the hip showed no widening or gapping of the fracture lines.  The surgical site was then thoroughly irrigated with 3 L normal saline.  Vancomycin powder was  placed in the surgical site.  IT band was closed with interrupted #1 Vicryl.  Subcutaneous fat with 0 Vicryl, subcutaneous skin closed with 2-0 Vicryl and skin closed with staples.  Incisional wound VAC was placed over.  Patient was placed in an abduction pillow.  Patient tolerated the procedure well and had no immediate complications.  Latasha Anderson was necessary for opening, closing, retracting, limb positioning and overall facilitation and timely completion of the procedure.  POSTOPERATIVE PLAN: Patient will need to return back to SNF post hospital stay.  Mobilize with PT.  Abduction pillow while in bed.  Posterior hip precautions for 3 months.  Weight-bear as tolerated.  Azucena Cecil, MD 6:40 PM

## 2021-02-03 NOTE — Progress Notes (Addendum)
PROGRESS NOTE    Latasha Anderson  [id number] DOB: 1930-09-04 DOA: 02/01/2021 PCP: Jerrol Banana., MD  Brief Narrative:Latasha Anderson is an 87 y.o. female with medical history significant of A Fib, HTN, HLD, dementia, who presented from her SNF after witnessed fall trying to stand from wheelchair. She is wheelchair bound at baseline with advanced dementia, is oriented to self only, does not answer questions appropriately, can feed self with fingers but doesn't eat much, is otherwise total care at baseline.In ED, Xray showed LEFT hip prosthesis with acute mildly displaced periprosthetic fracture of the proximal LEFT femur   Assessment & Plan:   Left periprosthetic fracture of left femur after mechanical fall -Orthopedic surgery consulted, Dr.Xu discussed options with family yesterday plan to proceed with ORIF with revision of the implant today -she is mostly wheelchair-bound and total care at SNF -Pain control, decrease narcotics to prevent oversedation -add Lovenox for DVT prophylaxis post op  Leukocytosis, abnormal urinalysis -Continue IV ceftriaxone, follow-up urine cultures   Chronic A Fib -Hold aspirin -Continue cardizem    HLD -Continue lipitor    Advanced dementia -Continue seroquel   DVT prophylaxis: Lovenox Code Status: DNR Family Communication: Husband and daughter at bedside Disposition Plan:  Status is: Inpatient  Remains inpatient appropriate because:Inpatient level of care appropriate due to severity of illness  Dispo: The patient is from: SNF              Anticipated d/c is to: SNF              Patient currently is not medically stable to d/c.   Difficult to place patient No        Consultants:  Orthopedics  Procedures:   Antimicrobials:    Subjective: -More sleepy this morning after pain medicines  Objective: Vitals:   02/02/21 2045 02/03/21 0735 02/03/21 1140 02/03/21 1254  BP: (!) 121/57 115/68 126/76 (!) 123/59  Pulse:  92 87 77 87  Resp:  '17 19 18  '$ Temp:  98 F (36.7 C) 98.4 F (36.9 C) 97.6 F (36.4 C)  TempSrc:    Oral  SpO2: 97% 95% 96% 96%  Weight:      Height:        Intake/Output Summary (Last 24 hours) at 02/03/2021 1558 Last data filed at 02/03/2021 1551 Gross per 24 hour  Intake 865 ml  Output 2050 ml  Net -1185 ml   Filed Weights   02/01/21 1448  Weight: 65 kg    Examination:  General exam: Chronically ill elderly female, laying in bed, somnolent but easily arousable, answers a few questions, oriented to self only CVS: S1-S2, regular rate rhythm Lungs: Decreased breath sounds to bases Abdomen: Soft, nontender, bowel sounds present Extremities: 1+ edema, right > left  Skin: No rash on exposed skin Psych: Advanced dementia   Data Reviewed:   CBC: Recent Labs  Lab 02/01/21 1509 02/02/21 0155 02/03/21 0148  WBC 17.1* 11.4* 10.5  NEUTROABS 14.3*  --   --   HGB 14.5 11.7* 9.0*  HCT 45.4 36.8 27.5*  MCV 93.8 91.8 91.4  PLT 388 359 [id number]   Basic Metabolic Panel: Recent Labs  Lab 02/01/21 1509 02/02/21 0155 02/03/21 0148  NA 136 134* 134*  K 4.5 4.1 3.5  CL 97* 97* 99  CO2 '30 28 28  '$ GLUCOSE 117* 186* 126*  BUN '12 14 17  '$ CREATININE 0.58 0.67 0.41*  CALCIUM 9.2 9.0 8.5*   GFR: Estimated Creatinine Clearance: 45.5 mL/min (  A) (by C-G formula based on SCr of 0.41 mg/dL (L)). Liver Function Tests: Recent Labs  Lab 02/01/21 1509  AST 34  ALT 12  ALKPHOS 77  BILITOT 0.5  PROT 6.5  ALBUMIN 3.4*   No results for input(s): LIPASE, AMYLASE in the last 168 hours. No results for input(s): AMMONIA in the last 168 hours. Coagulation Profile: No results for input(s): INR, PROTIME in the last 168 hours. Cardiac Enzymes: No results for input(s): CKTOTAL, CKMB, CKMBINDEX, TROPONINI in the last 168 hours. BNP (last 3 results) No results for input(s): PROBNP in the last 8760 hours. HbA1C: No results for input(s): HGBA1C in the last 72 hours. CBG: No results for input(s):  GLUCAP in the last 168 hours. Lipid Profile: No results for input(s): CHOL, HDL, LDLCALC, TRIG, CHOLHDL, LDLDIRECT in the last 72 hours. Thyroid Function Tests: No results for input(s): TSH, T4TOTAL, FREET4, T3FREE, THYROIDAB in the last 72 hours. Anemia Panel: No results for input(s): VITAMINB12, FOLATE, FERRITIN, TIBC, IRON, RETICCTPCT in the last 72 hours. Urine analysis:    Component Value Date/Time   COLORURINE AMBER (A) 02/01/2021 1631   APPEARANCEUR CLOUDY (A) 02/01/2021 1631   LABSPEC 1.016 02/01/2021 1631   PHURINE 6.0 02/01/2021 1631   GLUCOSEU NEGATIVE 02/01/2021 1631   HGBUR LARGE (A) 02/01/2021 1631   BILIRUBINUR NEGATIVE 02/01/2021 1631   KETONESUR NEGATIVE 02/01/2021 1631   PROTEINUR 100 (A) 02/01/2021 1631   NITRITE POSITIVE (A) 02/01/2021 1631   LEUKOCYTESUR LARGE (A) 02/01/2021 1631   Sepsis Labs: '@LABRCNTIP'$ (procalcitonin:4,lacticidven:4)  ) Recent Results (from the past 240 hour(s))  Resp Panel by RT-PCR (Flu A&B, Covid) Nasopharyngeal Swab     Status: None   Collection Time: 02/01/21  3:09 PM   Specimen: Nasopharyngeal Swab; Nasopharyngeal(NP) swabs in vial transport medium  Result Value Ref Range Status   SARS Coronavirus 2 by RT PCR NEGATIVE NEGATIVE Final    Comment: (NOTE) SARS-CoV-2 target nucleic acids are NOT DETECTED.  The SARS-CoV-2 RNA is generally detectable in upper respiratory specimens during the acute phase of infection. The lowest concentration of SARS-CoV-2 viral copies this assay can detect is 138 copies/mL. A negative result does not preclude SARS-Cov-2 infection and should not be used as the sole basis for treatment or other patient management decisions. A negative result may occur with  improper specimen collection/handling, submission of specimen other than nasopharyngeal swab, presence of viral mutation(s) within the areas targeted by this assay, and inadequate number of viral copies(<138 copies/mL). A negative result must be  combined with clinical observations, patient history, and epidemiological information. The expected result is Negative.  Fact Sheet for Patients:  EntrepreneurPulse.com.au  Fact Sheet for Healthcare Providers:  IncredibleEmployment.be  This test is no t yet approved or cleared by the [place] FDA and  has been authorized for detection and/or diagnosis of SARS-CoV-2 by FDA under an Emergency Use Authorization (EUA). This EUA will remain  in effect (meaning this test can be used) for the duration of the COVID-19 declaration under Section 564(b)(1) of the Act, 21 U.S.C.section 360bbb-3(b)(1), unless the authorization is terminated  or revoked sooner.       Influenza A by PCR NEGATIVE NEGATIVE Final   Influenza B by PCR NEGATIVE NEGATIVE Final    Comment: (NOTE) The Xpert Xpress SARS-CoV-2/FLU/RSV plus assay is intended as an aid in the diagnosis of influenza from Nasopharyngeal swab specimens and should not be used as a sole basis for treatment. Nasal washings and aspirates are unacceptable for Xpert Xpress SARS-CoV-2/FLU/RSV  testing.  Fact Sheet for Patients: EntrepreneurPulse.com.au  Fact Sheet for Healthcare Providers: IncredibleEmployment.be  This test is not yet approved or cleared by the [place] FDA and has been authorized for detection and/or diagnosis of SARS-CoV-2 by FDA under an Emergency Use Authorization (EUA). This EUA will remain in effect (meaning this test can be used) for the duration of the COVID-19 declaration under Section 564(b)(1) of the Act, 21 U.S.C. section 360bbb-3(b)(1), unless the authorization is terminated or revoked.  Performed at [facility], [place] [address] [place], [place] [address]   Surgical pcr screen     Status: None   Collection Time: 02/02/21  3:52 PM   Specimen: Nasal Mucosa; Nasal Swab  Result Value Ref Range Status   MRSA, PCR  NEGATIVE NEGATIVE Final   Staphylococcus aureus NEGATIVE NEGATIVE Final    Comment: (NOTE) The Xpert SA Assay (FDA approved for NASAL specimens in patients 71 years of age and older), is one component of a comprehensive surveillance program. It is not intended to diagnose infection nor to guide or monitor treatment. Performed at [facility], [place] [address], [place], [place] [address]          Radiology Studies: CT Head Wo Contrast  Result Date: 02/01/2021 CLINICAL DATA:  Witnessed fall trying to stand from wheelchair, EMS states patient struck head, denies loss of consciousness EXAM: CT HEAD WITHOUT CONTRAST TECHNIQUE: Contiguous axial images were obtained from the base of the skull through the vertex without intravenous contrast. Sagittal and coronal MPR images reconstructed from axial data set. COMPARISON:  01/09/2021 FINDINGS: Brain: Undo generalized atrophy. Normal ventricular morphology. No midline shift or mass effect. Small vessel chronic ischemic changes of deep cerebral white matter. No intracranial hemorrhage or evidence of acute infarction. Asymmetric prominence of extra-axial fluid at anterior aspect of LEFT middle cranial fossa versus RIGHT, likely represents asymmetric atrophy of the temporal lobes, doubt arachnoid cyst. No acute extra-axial hemorrhage. Vascular: Atherosclerotic calcification at RIGHT vertebral artery. No hyperdense vessels. Skull: Demineralized but intact Sinuses/Orbits: Clear Other: N/A IMPRESSION: Atrophy with small vessel chronic ischemic changes of deep cerebral white matter. No acute intracranial abnormalities. Electronically Signed   By: Lavonia Dana M.D.   On: 02/01/2021 16:23   CT Cervical Spine Wo Contrast  Result Date: 02/01/2021 CLINICAL DATA:  Witnessed fall while trying to stand from wheelchair EXAM: CT CERVICAL SPINE WITHOUT CONTRAST TECHNIQUE: Multidetector CT imaging of the cervical spine was performed without intravenous contrast.  Multiplanar CT image reconstructions were also generated. COMPARISON:  01/09/2021 FINDINGS: Alignment: Slight anterolisthesis at C3-C4 and T2-T3 unchanged. Remaining alignments normal. Skull base and vertebrae: Osseous demineralization. Vertebral body heights maintained. Multilevel disc space narrowing and endplate spur formation throughout cervical spine. Multilevel facet degenerative changes. Skull base intact. No fracture, additional subluxation, or bone destruction. Soft tissues and spinal canal: Prevertebral soft tissues normal thickness. Atherosclerotic calcifications at carotid bifurcations bilaterally as well as at RIGHT vertebral artery. Disc levels:  No specific abnormalities Upper chest: Small BILATERAL pleural effusions. Lung apices otherwise clear. Other: N/A IMPRESSION: Multilevel degenerative disc and facet disease changes of the cervical spine. No acute cervical spine abnormalities. Small BILATERAL pleural effusions. Electronically Signed   By: Lavonia Dana M.D.   On: 02/01/2021 16:29   CT FEMUR LEFT WO CONTRAST  Result Date: 02/03/2021 CLINICAL DATA:  Periprosthetic fracture of the proximal left femur. EXAM: CT OF THE LOWER LEFT EXTREMITY WITHOUT CONTRAST TECHNIQUE: Multidetector CT imaging of the left thigh was performed according  to the standard protocol. COMPARISON:  Radiographs 02/01/2021 and 12/28/2020 FINDINGS: Bones/Joint/Cartilage Images extend from the lower left pelvis through the left knee. Patient is status post left hip bipolar hemiarthroplasty. The hip remains located, and there is no evidence left pelvic fracture. There is a comminuted and moderately displaced periprosthetic fracture of the proximal left femur. This fracture is associated with 2 primary fracture fragments. An anterior component extending into the greater trochanter measures up to 12.6 cm in length and demonstrates anterolateral displacement inferiorly by up to 2.0 cm. There is a less displaced component involving  the posterolateral aspect of the proximal right femur. This fracture fragment measures 9.8 cm in length and demonstrates 1 cm of posterolateral displacement inferiorly. The fractures extend to the level of the distal end of the femoral prosthesis. The distal femur appears intact. There is a moderate size knee joint effusion without evidence of lipohemarthrosis or acute osseous injury. Ligaments Suboptimally assessed by CT. Muscles and Tendons Large hematoma involving the quadriceps musculature, primarily the vastus intermedius muscle. This hemorrhage extends into the distal thigh. No apparent tendon injury. Soft tissues Moderate soft tissue swelling in the proximal thigh without additional focal fluid collection or unexpected foreign body. IMPRESSION: 1. Comminuted and moderately displaced periprosthetic fracture of the left femur as described. No extension of the fracture beyond the tip of the femoral prosthesis identified. 2. No evidence of dislocation of the left hip hemiarthroplasty or fracture of the left hemipelvis. 3. Prominent hematoma involving the quadriceps musculature. Electronically Signed   By: Richardean Sale M.D.   On: 02/03/2021 08:49   DG FEMUR MIN 2 VIEWS LEFT  Result Date: 02/01/2021 CLINICAL DATA:  Femur fracture post fall, scheduled for surgery 322 EXAM: LEFT FEMUR 2 VIEWS COMPARISON:  Radiograph 02/01/2021 FINDINGS: Left hip hemiarthroplasty. Dedicated femur images further demonstrate the periprosthetic femur fracture extending from the trochanteric region towards the distal aspect of the femoral stem in the proximal femoral diaphysis. Minimal comminution and lateral displacement of the dominant fracture fragments. No distal femur fracture is identified. Diffuse soft tissue swelling of the hip and proximal thigh laterally. Tricompartmental degenerative changes noted at the knee, incompletely assessed on this exam. IMPRESSION: Left hip hemiarthroplasty with a periprosthetic left femoral  fracture and adjacent soft tissue swelling. Electronically Signed   By: Lovena Le M.D.   On: 02/01/2021 22:23        Scheduled Meds:  [MAR Hold] atorvastatin  10 mg Oral Daily   [MAR Hold] Chlorhexidine Gluconate Cloth  6 each Topical Q0600   [MAR Hold] cholecalciferol  1,000 Units Oral Q1200   [MAR Hold] diltiazem  60 mg Oral Q12H   [MAR Hold] docusate sodium  100 mg Oral BID   [MAR Hold] feeding supplement  237 mL Oral BID BM   [MAR Hold] ferrous sulfate  325 mg Oral TID PC   [MAR Hold] latanoprost  1 drop Both Eyes Q1200   [MAR Hold] multivitamin with minerals  1 tablet Oral Daily   [MAR Hold] QUEtiapine  25 mg Oral QHS   [MAR Hold] senna  1 tablet Oral BID   [MAR Hold] sodium chloride flush  3 mL Intravenous Q12H   [MAR Hold] sodium chloride flush  3 mL Intravenous Q12H   [MAR Hold] timolol  1 drop Both Eyes Q1200   tranexamic acid (CYKLOKAPRON) topical - INTRAOP  2,000 mg Topical To OR   Continuous Infusions:  [MAR Hold] sodium chloride     [MAR Hold] cefTRIAXone (ROCEPHIN)  IV 1  g (02/03/21 [id number])   lactated ringers 10 mL/hr at 02/03/21 1313     LOS: 2 days   Time spent: 18mn  PDomenic Polite MD [facility]   02/03/2021, 3:58 PM

## 2021-02-03 NOTE — H&P (Signed)

## 2021-02-04 ENCOUNTER — Encounter (HOSPITAL_COMMUNITY): Payer: Self-pay | Admitting: Orthopaedic Surgery

## 2021-02-04 LAB — CBC
HCT: 28.7 % — ABNORMAL LOW (ref 36.0–46.0)
Hemoglobin: 9.6 g/dL — ABNORMAL LOW (ref 12.0–15.0)
MCH: 30.7 pg (ref 26.0–34.0)
MCHC: 33.4 g/dL (ref 30.0–36.0)
MCV: 91.7 fL (ref 80.0–100.0)
Platelets: 185 10*3/uL (ref 150–400)
RBC: 3.13 MIL/uL — ABNORMAL LOW (ref 3.87–5.11)
RDW: 14.6 % (ref 11.5–15.5)
WBC: 13.7 10*3/uL — ABNORMAL HIGH (ref 4.0–10.5)
nRBC: 0 % (ref 0.0–0.2)

## 2021-02-04 LAB — BPAM RBC
Blood Product Expiration Date: 202208252359
Blood Product Expiration Date: 202208252359
ISSUE DATE / TIME: 202208031509
ISSUE DATE / TIME: 202208031509
Unit Type and Rh: 6200
Unit Type and Rh: 6200

## 2021-02-04 LAB — TYPE AND SCREEN
ABO/RH(D): A POS
Antibody Screen: NEGATIVE
Unit division: 0
Unit division: 0

## 2021-02-04 LAB — BASIC METABOLIC PANEL
Anion gap: 9 (ref 5–15)
BUN: 17 mg/dL (ref 8–23)
CO2: 27 mmol/L (ref 22–32)
Calcium: 8.1 mg/dL — ABNORMAL LOW (ref 8.9–10.3)
Chloride: 97 mmol/L — ABNORMAL LOW (ref 98–111)
Creatinine, Ser: 0.56 mg/dL (ref 0.44–1.00)
GFR, Estimated: >60 mL/min (ref >60)
Glucose, Bld: 144 mg/dL — ABNORMAL HIGH (ref 70–99)
Potassium: 4.1 mmol/L (ref 3.5–5.1)
Sodium: 133 mmol/L — ABNORMAL LOW (ref 135–145)

## 2021-02-04 MED ORDER — SODIUM CHLORIDE 0.9 % IV SOLN: INTRAVENOUS | Status: AC

## 2021-02-04 MED ORDER — ENOXAPARIN SODIUM 40 MG/0.4ML IJ SOSY: 40.0000 mg | PREFILLED_SYRINGE | INTRAMUSCULAR | 0 refills | Status: AC

## 2021-02-04 NOTE — Plan of Care (Signed)
?  Problem: Clinical Measurements: ?Goal: Ability to maintain clinical measurements within normal limits will improve ?Outcome: Progressing ?Goal: Will remain free from infection ?Outcome: Progressing ?Goal: Diagnostic test results will improve ?Outcome: Progressing ?  ?

## 2021-02-04 NOTE — Discharge Instructions (Signed)
Weight bearing as tolerated Continue with hip abduction pillow at all times Lovenox for dvt ppx Continue with wound vac until f/u appt with Dr. Erlinda Hong

## 2021-02-04 NOTE — TOC Progression Note (Signed)
Transition of Care [facility]) - Progression Note    Patient Details  Name: Latasha Anderson MRN: [id number] Date of Birth: 03/19/1931  [facility]) CM/SW Contact  Milinda Antis, LCSWA Phone Number: 02/04/2021, 1:38 PM  Clinical Narrative:    CSW received a response from [facility].  The facility will only need insurance auth. to receive the patient.  CSW requested that the [facility] CMA's begin insurance auth.          Expected Discharge Plan and Services                                                 Social Determinants of Health (SDOH) Interventions    Readmission Risk Interventions No flowsheet data found.

## 2021-02-04 NOTE — NC FL2 (Signed)
[place] MEDICAID FL2 LEVEL OF CARE SCREENING TOOL     IDENTIFICATION  Patient Name: Latasha Anderson Birthdate: 05-06-1931 Sex: female Admission Date (Current Location): 02/01/2021  [facility] and [place] Number:  Herbalist and Address:  [facility] [place], [place] [address] [place], [place] [address]      Provider Number: [id number]  Attending Physician Name and Address:  Domenic Polite, MD  Relative Name and Phone Number:  Alinah, Nienow (Spouse)   [phone]    Current Level of Care: Hospital Recommended Level of Care: [place] Prior Approval Number:    Date Approved/Denied:   PASRR Number: [id number]  Discharge Plan: SNF    Current Diagnoses: Patient Active Problem List   Diagnosis Date Noted   Periprosthetic fracture around internal prosthetic left hip joint ([place]) 02/01/2021   Dementia ([place]) 02/01/2021   DNR (do not resuscitate) 02/01/2021   Hip fracture ([place]) 12/06/2020   Generalized weakness 11/27/2020   Low back pain 11/27/2020   Frequent falls 11/27/2020   (HFpEF) heart failure with preserved ejection fraction (HCC)    Chronic atrial fibrillation ([place]) 05/01/2015   Atrophic vaginitis 12/15/2014   Breast CA ([place]) 12/15/2014   Decrease in the ability to hear 12/15/2014   Essential (primary) hypertension 12/15/2014   Fatigue 12/15/2014   Acid reflux 12/15/2014   Glaucoma 12/15/2014   Flu vaccine need 12/15/2014   Blood glucose elevated 12/15/2014   Arthritis, degenerative 12/15/2014   OP (osteoporosis) 12/15/2014   HLD (hyperlipidemia) 12/15/2014   Pain of upper abdomen 12/15/2014   Encounter for therapeutic drug monitoring 09/04/2013   Preop cardiovascular exam 03/07/2013   Hypokalemia    Long term (current) use of anticoagulants 08/22/2012   Hypertension    Breast cancer (HCC)    Shortness of breath    Chest pain    Ejection fraction    Mitral regurgitation    SHINGLES 07/26/2010    Orientation  RESPIRATION BLADDER Height & Weight     Self  Normal (see d/c summary) External catheter, Incontinent Weight: 143 lb 4.8 oz (65 kg) Height:  '5\' 6"'$  (167.6 cm)  BEHAVIORAL SYMPTOMS/MOOD NEUROLOGICAL BOWEL NUTRITION STATUS      Incontinent Diet (see d/c summary)  AMBULATORY STATUS COMMUNICATION OF NEEDS Skin   Extensive Assist Verbally Surgical wounds                       Personal Care Assistance Level of Assistance  Bathing, Feeding, Dressing Bathing Assistance: Maximum assistance Feeding assistance: Limited assistance Dressing Assistance: Maximum assistance     Functional Limitations Info  Sight, Hearing, Speech Sight Info: Adequate Hearing Info: Adequate Speech Info: Adequate    SPECIAL CARE FACTORS FREQUENCY  PT (By licensed PT), OT (By licensed OT)     PT Frequency: 5x/ week OT Frequency: 5x/ week            Contractures      Additional Factors Info  Code Status, Allergies Code Status Info: DNR Allergies Info: Ciprofloxacin   Cortisone   Nitrofurantoin Monohyd Macro           Current Medications (02/04/2021):  This is the current hospital active medication list Current Facility-Administered Medications  Medication Dose Route Frequency Provider Last Rate Last Admin   0.9 %  sodium chloride infusion  250 mL Intravenous PRN Leandrew Koyanagi, MD       0.9 %  sodium chloride infusion   Intravenous Continuous Domenic Polite, MD 50  mL/hr at 02/04/21 1003 New Bag at 02/04/21 1003   acetaminophen (TYLENOL) tablet 1,000 mg  1,000 mg Oral Q6H Leandrew Koyanagi, MD   1,000 mg at 02/04/21 1116   acetaminophen (TYLENOL) tablet 325-650 mg  325-650 mg Oral Q6H PRN Leandrew Koyanagi, MD       alum & mag hydroxide-simeth (MAALOX/MYLANTA) 200-200-20 MG/5ML suspension 30 mL  30 mL Oral Q4H PRN Leandrew Koyanagi, MD       atorvastatin (LIPITOR) tablet 10 mg  10 mg Oral Daily Leandrew Koyanagi, MD   10 mg at 02/04/21 0953   cefTRIAXone (ROCEPHIN) 1 g in sodium chloride 0.9 % 100 mL IVPB  1 g  Intravenous Q24H Leandrew Koyanagi, MD 200 mL/hr at 02/04/21 0819 1 g at 02/04/21 0819   Chlorhexidine Gluconate Cloth 2 % PADS 6 each  6 each Topical Q0600 Leandrew Koyanagi, MD   6 each at 02/04/21 0616   cholecalciferol (VITAMIN D3) tablet 1,000 Units  1,000 Units Oral Q1200 Leandrew Koyanagi, MD   1,000 Units at 02/04/21 1116   diltiazem (CARDIZEM SR) 12 hr capsule 60 mg  60 mg Oral Q12H Leandrew Koyanagi, MD   60 mg at 02/04/21 0953   docusate sodium (COLACE) capsule 100 mg  100 mg Oral BID Leandrew Koyanagi, MD   100 mg at 02/04/21 0953   enoxaparin (LOVENOX) injection 40 mg  40 mg Subcutaneous Q24H Leandrew Koyanagi, MD   40 mg at 02/04/21 1321   feeding supplement (ENSURE SURGERY) liquid 237 mL  237 mL Oral BID BM Leandrew Koyanagi, MD   237 mL at 02/04/21 1321   ferrous sulfate tablet 325 mg  325 mg Oral TID PC Leandrew Koyanagi, MD   325 mg at 02/04/21 1321   HYDROcodone-acetaminophen (NORCO/VICODIN) 5-325 MG per tablet 1 tablet  1 tablet Oral Q6H PRN Leandrew Koyanagi, MD       HYDROmorphone (DILAUDID) injection 0.5-1 mg  0.5-1 mg Intravenous Q4H PRN Leandrew Koyanagi, MD       latanoprost (XALATAN) 0.005 % ophthalmic solution 1 drop  1 drop Both Eyes Q1200 Leandrew Koyanagi, MD   1 drop at 02/04/21 1002   menthol-cetylpyridinium (CEPACOL) lozenge 3 mg  1 lozenge Oral PRN Leandrew Koyanagi, MD       Or   phenol (CHLORASEPTIC) mouth spray 1 spray  1 spray Mouth/Throat PRN Leandrew Koyanagi, MD       methocarbamol (ROBAXIN) tablet 500 mg  500 mg Oral Q6H PRN Leandrew Koyanagi, MD       Or   methocarbamol (ROBAXIN) 500 mg in dextrose 5 % 50 mL IVPB  500 mg Intravenous Q6H PRN Leandrew Koyanagi, MD       multivitamin with minerals tablet 1 tablet  1 tablet Oral Daily Leandrew Koyanagi, MD   1 tablet at 02/04/21 0953   ondansetron (ZOFRAN) tablet 4 mg  4 mg Oral Q6H PRN Leandrew Koyanagi, MD       Or   ondansetron [facility]) injection 4 mg  4 mg Intravenous Q6H PRN Leandrew Koyanagi, MD       oxyCODONE (Oxy IR/ROXICODONE) immediate release tablet 10-15 mg  10-15  mg Oral Q4H PRN Leandrew Koyanagi, MD       oxyCODONE (Oxy IR/ROXICODONE) immediate release tablet 5-10 mg  5-10 mg Oral Q4H PRN Leandrew Koyanagi, MD       polyethylene glycol (MIRALAX /  GLYCOLAX) packet 17 g  17 g Oral Daily PRN Leandrew Koyanagi, MD       QUEtiapine (SEROQUEL) tablet 25 mg  25 mg Oral QHS Leandrew Koyanagi, MD   25 mg at 02/03/21 2226   senna (SENOKOT) tablet 8.6 mg  1 tablet Oral BID Leandrew Koyanagi, MD   8.6 mg at 02/04/21 0953   sodium chloride flush (NS) 0.9 % injection 3 mL  3 mL Intravenous Q12H Leandrew Koyanagi, MD   3 mL at 02/04/21 0954   sodium chloride flush (NS) 0.9 % injection 3 mL  3 mL Intravenous PRN Leandrew Koyanagi, MD       sorbitol 70 % solution 30 mL  30 mL Oral Daily PRN Leandrew Koyanagi, MD       timolol (TIMOPTIC) 0.5 % ophthalmic solution 1 drop  1 drop Both Eyes Q1200 Leandrew Koyanagi, MD   1 drop at 02/04/21 1002     Discharge Medications: Please see discharge summary for a list of discharge medications.  Relevant Imaging Results:  Relevant Lab Results:   Additional Information SS#[id number];Pfizer COVID-19 Vaccine 05/21/2020 , 09/25/2019 , 08/28/2019  Milinda Antis, LCSWA

## 2021-02-04 NOTE — Anesthesia Postprocedure Evaluation (Signed)
Anesthesia Post Note  Patient: Latasha Anderson  Procedure(s) Performed: ORIF OF LEFT POSTERIOR FEMUR FRACTURE & REVISION OF STEM (Left: Hip)     Patient location during evaluation: PACU Anesthesia Type: General Level of consciousness: awake and alert Pain management: pain level controlled Vital Signs Assessment: post-procedure vital signs reviewed and stable Respiratory status: spontaneous breathing, nonlabored ventilation, respiratory function stable and patient connected to nasal cannula oxygen Cardiovascular status: blood pressure returned to baseline and stable Postop Assessment: no apparent nausea or vomiting Anesthetic complications: no   No notable events documented.  Last Vitals:  Vitals:   02/04/21 0749 02/04/21 1141  BP: 124/76 114/89  Pulse: 90 100  Resp: 16 16  Temp: 36.9 C 36.7 C  SpO2: 96% 97%    Last Pain:  Vitals:   02/04/21 1040  TempSrc:   PainSc: 0-No pain                 Catalina Gravel

## 2021-02-04 NOTE — Evaluation (Signed)
Occupational Therapy Evaluation Patient Details Name: Latasha Anderson MRN: [id number] DOB: 02-24-1931 Today's Date: 02/04/2021    History of Present Illness 87 yo female presents to [facility] on 8/1 s/p fall sustaining L periprosthetic fracture, pt w/c level at baseline. s/p ORIF L greater trochanter fx, subtrochanteric fx, L femoral shaft fx, and revision L hip hemiarthroplasty posterior approach. PMH includes afib, HTN, HLD, dementia, breast cancer, HF, mastectomy, L hemiarthroplasty anterior approach 12/2020.   Clinical Impression   Pt admitted for concerns and procedure listed above. PTA pt was living at a SNF, receiving assistance for all functional mobility and ADL's. At this time, pt presents with increased pain and confusion (due to pt's h/o dementia at baseline). Pt required max A +2 for bed mobility and max encouragement to participate in therapy. OT will continue to follow up with pt to address concerns and deficits listed below.     Follow Up Recommendations  SNF;Supervision/Assistance - 24 hour    Equipment Recommendations  None recommended by OT    Recommendations for Other Services       Precautions / Restrictions Precautions Precautions: Posterior Hip;Fall Precaution Booklet Issued: No Precaution Comments: Reviewed hip precautions with family Restrictions Weight Bearing Restrictions: Yes RLE Weight Bearing: Weight bearing as tolerated LLE Weight Bearing: Weight bearing as tolerated Other Position/Activity Restrictions: Per orders in chart      Mobility Bed Mobility Overal bed mobility: Needs Assistance Bed Mobility: Supine to Sit;Sit to Supine     Supine to sit: Max assist;+2 for physical assistance;+2 for safety/equipment Sit to supine: Max assist;+2 for safety/equipment;+2 for physical assistance   General bed mobility comments: OT/PT utilized helicopter technique to assist pt to coming EOB from supine and back to supine.    Transfers                  General transfer comment: deferred due to pain    Balance Overall balance assessment: Needs assistance Sitting-balance support: Bilateral upper extremity supported;Feet unsupported Sitting balance-Leahy Scale: Fair Sitting balance - Comments: pt sat EOB for 4-5 mins with no support.       Standing balance comment: deferred at this time                           ADL either performed or assessed with clinical judgement   ADL Overall ADL's : Needs assistance/impaired Eating/Feeding: Minimal assistance;Bed level   Grooming: Moderate assistance;Sitting   Upper Body Bathing: Moderate assistance;Sitting   Lower Body Bathing: Total assistance;+2 for physical assistance;+2 for safety/equipment   Upper Body Dressing : Minimal assistance;Sitting   Lower Body Dressing: Total assistance;+2 for physical assistance;+2 for safety/equipment;Bed level       Toileting- Clothing Manipulation and Hygiene: Total assistance;+2 for physical assistance;+2 for safety/equipment;Bed level         General ADL Comments: Pt remained bed level and sitting EOB for session due to pain and confusion. requiring min-total +1-2 assist for all ADL's     Vision Baseline Vision/History: Wears glasses Wears Glasses: At all times Patient Visual Report: No change from baseline Vision Assessment?: No apparent visual deficits     Perception Perception Perception Tested?: No   Praxis Praxis Praxis tested?: Not tested    Pertinent Vitals/Pain Pain Assessment: Faces Faces Pain Scale: Hurts whole lot Pain Location: BLE and bottom Pain Descriptors / Indicators: Aching;Discomfort;Grimacing;Guarding Pain Intervention(s): Limited activity within patient's tolerance;Monitored during session;Repositioned     Hand Dominance Right   Extremity/Trunk  Assessment Upper Extremity Assessment Upper Extremity Assessment: Overall WFL for tasks assessed (assessed functionally as pt moved arms and gripped  therapist hand/arms)   Lower Extremity Assessment Lower Extremity Assessment: Defer to PT evaluation   Cervical / Trunk Assessment Cervical / Trunk Assessment: Kyphotic   Communication Communication Communication: No difficulties   Cognition Arousal/Alertness: Awake/alert Behavior During Therapy: WFL for tasks assessed/performed Overall Cognitive Status: History of cognitive impairments - at baseline                                 General Comments: Pt has history of dementia   General Comments  BLE swollen, abductor pillow in place    Exercises     Shoulder Instructions      Home Living Family/patient expects to be discharged to:: Skilled nursing facility                                        Prior Functioning/Environment Level of Independence: Needs assistance  Gait / Transfers Assistance Needed: Pt mainly WC bound, family reported some walking with RW and therapist ADL's / Homemaking Assistance Needed: All ADL's are assisted by staff at SNF   Comments: Info obtained from family        OT Problem List: Decreased strength;Decreased range of motion;Decreased activity tolerance;Impaired balance (sitting and/or standing);Decreased coordination;Decreased cognition;Pain;Increased edema      OT Treatment/Interventions: Self-care/ADL training;Therapeutic exercise;Energy conservation;DME and/or AE instruction;Therapeutic activities;Patient/family education;Balance training    OT Goals(Current goals can be found in the care plan section) Acute Rehab OT Goals Patient Stated Goal: to go back to bed OT Goal Formulation: With family Time For Goal Achievement: 02/18/21 Potential to Achieve Goals: Fair ADL Goals Pt Will Perform Grooming: with set-up;sitting Pt Will Perform Lower Body Bathing: with mod assist;sitting/lateral leans;sit to/from stand Pt Will Perform Lower Body Dressing: with mod assist;sitting/lateral leans;sit to/from stand Pt Will  Transfer to Toilet: with mod assist;stand pivot transfer  OT Frequency: Min 2X/week   Barriers to D/C:            Co-evaluation PT/OT/SLP Co-Evaluation/Treatment: Yes Reason for Co-Treatment: Necessary to address cognition/behavior during functional activity;For patient/therapist safety;To address functional/ADL transfers PT goals addressed during session: Mobility/safety with mobility;Balance OT goals addressed during session: ADL's and self-care      AM-PAC OT "6 Clicks" Daily Activity     Outcome Measure Help from another person eating meals?: A Little Help from another person taking care of personal grooming?: A Lot Help from another person toileting, which includes using toliet, bedpan, or urinal?: Total Help from another person bathing (including washing, rinsing, drying)?: A Lot Help from another person to put on and taking off regular upper body clothing?: A Lot Help from another person to put on and taking off regular lower body clothing?: Total 6 Click Score: 11   End of Session Nurse Communication: Mobility status  Activity Tolerance: Patient limited by pain Patient left: in bed;with call bell/phone within reach;with bed alarm set;with family/visitor present  OT Visit Diagnosis: Unsteadiness on feet (R26.81);Other abnormalities of gait and mobility (R26.89);Muscle weakness (generalized) (M62.81);Pain Pain - Right/Left:  (both) Pain - part of body: Hip;Leg                Time: [id number] OT Time Calculation (min): 29 min Charges:  OT General Charges $OT Visit: 1 Visit  OT Evaluation $OT Eval Moderate Complexity: 1 Mod  Bethzaida Boord H., OTR/L Acute Rehabilitation  [place] Yolanda Bonine 02/04/2021, 12:51 PM

## 2021-02-04 NOTE — Progress Notes (Signed)
PROGRESS NOTE    Latasha Anderson  [id number] DOB: 1931/03/18 DOA: 02/01/2021 PCP: Jerrol Banana., MD  Brief Narrative:Latasha Anderson is an 87 y.o. female with medical history significant of A Fib, HTN, HLD, dementia, who presented from her SNF after witnessed fall trying to stand from wheelchair. She is wheelchair bound at baseline with advanced dementia, is oriented to self only, does not answer questions appropriately, can feed self with fingers but doesn't eat much, is otherwise total care at baseline.In ED, Xray showed LEFT hip prosthesis with acute mildly displaced periprosthetic fracture of the proximal LEFT femur   Assessment & Plan:   Left periprosthetic fracture of left femur after mechanical fall -Orthopedic surgery consulted, Dr.Xu discussed options with family yesterday -Underwent ORIF of left posterior femur fracture and revision of STEM -she is mostly wheelchair-bound and total care at SNF -Pain control, decrease narcotics to prevent oversedation -lovenox for DVT proph  Acute blood loss anemia -Transfused 2 units of PRBC Intra-Op, monitor hemoglobin  Leukocytosis, abnormal urinalysis -Continue IV ceftriaxone, follow-up urine cultures   Chronic A Fib -Hold aspirin -Continue cardizem    HLD -Continue lipitor    Advanced dementia -Continue seroquel   DVT prophylaxis: Lovenox Code Status: DNR Family Communication: Husband at bedside Disposition Plan:  Status is: Inpatient  Remains inpatient appropriate because:Inpatient level of care appropriate due to severity of illness  Dispo: The patient is from: SNF              Anticipated d/c is to: SNF              Patient currently is not medically stable to d/c.   Difficult to place patient No   Consultants:  Orthopedics  Procedures:   PROCEDURE:  Open reduction internal fixation of left greater trochanter fracture Open reduction internal fixation of left subtrochanteric fracture using  separate fixation Open reduction internal fixation of left femoral shaft fracture using separate fixation Removal of left hip hemiarthroplasty Revision left hip hemiarthroplasty Application of incisional wound VAC  Antimicrobials:    Subjective: -More sleepy this morning after pain medicines  Objective: Vitals:   02/03/21 2020 02/04/21 0614 02/04/21 0749 02/04/21 1141  BP: 114/85 123/63 124/76 114/89  Pulse: 92 (!) 106 90 100  Resp: '18 18 16 16  '$ Temp: 97.7 F (36.5 C) 98.4 F (36.9 C) 98.5 F (36.9 C) 98 F (36.7 C)  TempSrc:   Oral   SpO2: (!) 87% 100% 96% 97%  Weight:      Height:        Intake/Output Summary (Last 24 hours) at 02/04/2021 1236 Last data filed at 02/04/2021 0608 Gross per 24 hour  Intake 4130.84 ml  Output 1550 ml  Net 2580.84 ml   Filed Weights   02/01/21 1448  Weight: 65 kg    Examination:  General exam: Chronically ill elderly female, laying in bed, somnolent but easily arousable, answers a few questions, oriented to self only CVS: S1-S2, regular rate rhythm Lungs: Decreased breath sounds to bases Abdomen: Soft, nontender, bowel sounds present Extremities: 1+ edema, right > left  Skin: No rash on exposed skin Psych: Advanced dementia   Data Reviewed:   CBC: Recent Labs  Lab 02/01/21 1509 02/02/21 0155 02/03/21 0148 02/03/21 1510 02/03/21 1617 02/03/21 1708 02/04/21 0344  WBC 17.1* 11.4* 10.5  --   --   --  13.7*  NEUTROABS 14.3*  --   --   --   --   --   --  HGB 14.5 11.7* 9.0* 7.5* 8.2* 8.5* 9.6*  HCT 45.4 36.8 27.5* 22.0* 24.0* 25.0* 28.7*  MCV 93.8 91.8 91.4  --   --   --  91.7  PLT 388 359 232  --   --   --  [id number]   Basic Metabolic Panel: Recent Labs  Lab 02/01/21 1509 02/02/21 0155 02/03/21 0148 02/03/21 1510 02/03/21 1617 02/03/21 1708 02/04/21 0344  NA 136 134* 134* 134* 136 137 133*  K 4.5 4.1 3.5 4.1 4.5 4.1 4.1  CL 97* 97* 99  --  99  --  97*  CO2 '30 28 28  '$ --   --   --  27  GLUCOSE 117* 186* 126*  --  143*   --  144*  BUN '12 14 17  '$ --  17  --  17  CREATININE 0.58 0.67 0.41*  --  0.30*  --  0.56  CALCIUM 9.2 9.0 8.5*  --   --   --  8.1*   GFR: Estimated Creatinine Clearance: 45.5 mL/min (by C-G formula based on SCr of 0.56 mg/dL). Liver Function Tests: Recent Labs  Lab 02/01/21 1509  AST 34  ALT 12  ALKPHOS 77  BILITOT 0.5  PROT 6.5  ALBUMIN 3.4*   No results for input(s): LIPASE, AMYLASE in the last 168 hours. No results for input(s): AMMONIA in the last 168 hours. Coagulation Profile: No results for input(s): INR, PROTIME in the last 168 hours. Cardiac Enzymes: No results for input(s): CKTOTAL, CKMB, CKMBINDEX, TROPONINI in the last 168 hours. BNP (last 3 results) No results for input(s): PROBNP in the last 8760 hours. HbA1C: No results for input(s): HGBA1C in the last 72 hours. CBG: No results for input(s): GLUCAP in the last 168 hours. Lipid Profile: No results for input(s): CHOL, HDL, LDLCALC, TRIG, CHOLHDL, LDLDIRECT in the last 72 hours. Thyroid Function Tests: No results for input(s): TSH, T4TOTAL, FREET4, T3FREE, THYROIDAB in the last 72 hours. Anemia Panel: No results for input(s): VITAMINB12, FOLATE, FERRITIN, TIBC, IRON, RETICCTPCT in the last 72 hours. Urine analysis:    Component Value Date/Time   COLORURINE AMBER (A) 02/01/2021 1631   APPEARANCEUR CLOUDY (A) 02/01/2021 1631   LABSPEC 1.016 02/01/2021 1631   PHURINE 6.0 02/01/2021 1631   GLUCOSEU NEGATIVE 02/01/2021 1631   HGBUR LARGE (A) 02/01/2021 1631   BILIRUBINUR NEGATIVE 02/01/2021 1631   KETONESUR NEGATIVE 02/01/2021 1631   PROTEINUR 100 (A) 02/01/2021 1631   NITRITE POSITIVE (A) 02/01/2021 1631   LEUKOCYTESUR LARGE (A) 02/01/2021 1631   Sepsis Labs: '@LABRCNTIP'$ (procalcitonin:4,lacticidven:4)  ) Recent Results (from the past 240 hour(s))  Resp Panel by RT-PCR (Flu A&B, Covid) Nasopharyngeal Swab     Status: None   Collection Time: 02/01/21  3:09 PM   Specimen: Nasopharyngeal Swab;  Nasopharyngeal(NP) swabs in vial transport medium  Result Value Ref Range Status   SARS Coronavirus 2 by RT PCR NEGATIVE NEGATIVE Final    Comment: (NOTE) SARS-CoV-2 target nucleic acids are NOT DETECTED.  The SARS-CoV-2 RNA is generally detectable in upper respiratory specimens during the acute phase of infection. The lowest concentration of SARS-CoV-2 viral copies this assay can detect is 138 copies/mL. A negative result does not preclude SARS-Cov-2 infection and should not be used as the sole basis for treatment or other patient management decisions. A negative result may occur with  improper specimen collection/handling, submission of specimen other than nasopharyngeal swab, presence of viral mutation(s) within the areas targeted by this assay, and inadequate number of viral  copies(<138 copies/mL). A negative result must be combined with clinical observations, patient history, and epidemiological information. The expected result is Negative.  Fact Sheet for Patients:  EntrepreneurPulse.com.au  Fact Sheet for Healthcare Providers:  IncredibleEmployment.be  This test is no t yet approved or cleared by the [place] FDA and  has been authorized for detection and/or diagnosis of SARS-CoV-2 by FDA under an Emergency Use Authorization (EUA). This EUA will remain  in effect (meaning this test can be used) for the duration of the COVID-19 declaration under Section 564(b)(1) of the Act, 21 U.S.C.section 360bbb-3(b)(1), unless the authorization is terminated  or revoked sooner.       Influenza A by PCR NEGATIVE NEGATIVE Final   Influenza B by PCR NEGATIVE NEGATIVE Final    Comment: (NOTE) The Xpert Xpress SARS-CoV-2/FLU/RSV plus assay is intended as an aid in the diagnosis of influenza from Nasopharyngeal swab specimens and should not be used as a sole basis for treatment. Nasal washings and aspirates are unacceptable for Xpert Xpress  SARS-CoV-2/FLU/RSV testing.  Fact Sheet for Patients: EntrepreneurPulse.com.au  Fact Sheet for Healthcare Providers: IncredibleEmployment.be  This test is not yet approved or cleared by the [place] FDA and has been authorized for detection and/or diagnosis of SARS-CoV-2 by FDA under an Emergency Use Authorization (EUA). This EUA will remain in effect (meaning this test can be used) for the duration of the COVID-19 declaration under Section 564(b)(1) of the Act, 21 U.S.C. section 360bbb-3(b)(1), unless the authorization is terminated or revoked.  Performed at [facility], [place] [address], [place], [place] [address]   Surgical pcr screen     Status: None   Collection Time: 02/02/21  3:52 PM   Specimen: Nasal Mucosa; Nasal Swab  Result Value Ref Range Status   MRSA, PCR NEGATIVE NEGATIVE Final   Staphylococcus aureus NEGATIVE NEGATIVE Final    Comment: (NOTE) The Xpert SA Assay (FDA approved for NASAL specimens in patients 53 years of age and older), is one component of a comprehensive surveillance program. It is not intended to diagnose infection nor to guide or monitor treatment. Performed at [facility], [place] [address] [place], [place] [address]          Radiology Studies: Pelvis Portable  Result Date: 02/03/2021 CLINICAL DATA:  87 year old female status post ORIF of the left hip. EXAM: PORTABLE PELVIS 1-2 VIEWS COMPARISON:  Left hip CT dated 02/02/2021. FINDINGS: Total left hip arthroplasty with cerclage wires through the greater trochanter and proximal femur. The fracture fragments of the proximal femur appear in near anatomic alignment. The arthroplasty components appear intact and in anatomic alignment. Fracture of the greater trochanter of the left femur with cerclage wires. The bones are osteopenic. There is no dislocation. Degenerative changes of the right hip. Postsurgical changes in the soft tissues  of the left hip with cutaneous staples. IMPRESSION: Total left hip arthroplasty. Electronically Signed   By: Anner Crete M.D.   On: 02/03/2021 20:19   CT FEMUR LEFT WO CONTRAST  Result Date: 02/03/2021 CLINICAL DATA:  Periprosthetic fracture of the proximal left femur. EXAM: CT OF THE LOWER LEFT EXTREMITY WITHOUT CONTRAST TECHNIQUE: Multidetector CT imaging of the left thigh was performed according to the standard protocol. COMPARISON:  Radiographs 02/01/2021 and 12/28/2020 FINDINGS: Bones/Joint/Cartilage Images extend from the lower left pelvis through the left knee. Patient is status post left hip bipolar hemiarthroplasty. The hip remains located, and there is no evidence left pelvic fracture. There is a comminuted and  moderately displaced periprosthetic fracture of the proximal left femur. This fracture is associated with 2 primary fracture fragments. An anterior component extending into the greater trochanter measures up to 12.6 cm in length and demonstrates anterolateral displacement inferiorly by up to 2.0 cm. There is a less displaced component involving the posterolateral aspect of the proximal right femur. This fracture fragment measures 9.8 cm in length and demonstrates 1 cm of posterolateral displacement inferiorly. The fractures extend to the level of the distal end of the femoral prosthesis. The distal femur appears intact. There is a moderate size knee joint effusion without evidence of lipohemarthrosis or acute osseous injury. Ligaments Suboptimally assessed by CT. Muscles and Tendons Large hematoma involving the quadriceps musculature, primarily the vastus intermedius muscle. This hemorrhage extends into the distal thigh. No apparent tendon injury. Soft tissues Moderate soft tissue swelling in the proximal thigh without additional focal fluid collection or unexpected foreign body. IMPRESSION: 1. Comminuted and moderately displaced periprosthetic fracture of the left femur as described. No  extension of the fracture beyond the tip of the femoral prosthesis identified. 2. No evidence of dislocation of the left hip hemiarthroplasty or fracture of the left hemipelvis. 3. Prominent hematoma involving the quadriceps musculature. Electronically Signed   By: Richardean Sale M.D.   On: 02/03/2021 08:49   DG C-Arm 1-60 Min-No Report  Result Date: 02/03/2021 Fluoroscopy was utilized by the requesting physician.  No radiographic interpretation.   DG FEMUR PORT MIN 2 VIEWS LEFT  Result Date: 02/03/2021 CLINICAL DATA:  ORIF left femoral fracture EXAM: LEFT FEMUR PORTABLE 2 VIEWS COMPARISON:  02/01/2021 FINDINGS: Frontal and lateral views of the proximal aspect of the left femur are obtained. There has been revision of the femoral component of the left hip arthroplasty, with a long stem component and multiple cerclage wires identified within the proximal left femur. Previous fracture fragment is in anatomic alignment. Postsurgical changes are seen in the overlying soft tissues. IMPRESSION: 1. Revision of femoral component of the left hip arthroplasty, with multiple cerclage wire surrounding the previous fracture fragment. Alignment is anatomic. Electronically Signed   By: Randa Ngo M.D.   On: 02/03/2021 20:18     Scheduled Meds:  acetaminophen  1,000 mg Oral Q6H   atorvastatin  10 mg Oral Daily   Chlorhexidine Gluconate Cloth  6 each Topical Q0600   cholecalciferol  1,000 Units Oral Q1200   diltiazem  60 mg Oral Q12H   docusate sodium  100 mg Oral BID   enoxaparin (LOVENOX) injection  40 mg Subcutaneous Q24H   feeding supplement  237 mL Oral BID BM   ferrous sulfate  325 mg Oral TID PC   latanoprost  1 drop Both Eyes Q1200   multivitamin with minerals  1 tablet Oral Daily   QUEtiapine  25 mg Oral QHS   senna  1 tablet Oral BID   sodium chloride flush  3 mL Intravenous Q12H   timolol  1 drop Both Eyes Q1200   Continuous Infusions:  sodium chloride     sodium chloride 50 mL/hr at  02/04/21 1003   cefTRIAXone (ROCEPHIN)  IV 1 g (02/04/21 0819)   methocarbamol (ROBAXIN) IV       LOS: 3 days   Time spent: 82mn  PDomenic Polite MD [facility]   02/04/2021, 12:36 PM

## 2021-02-04 NOTE — Progress Notes (Addendum)
Subjective: 1 Day Post-Op Procedure(s) (LRB): ORIF OF LEFT POSTERIOR FEMUR FRACTURE & REVISION OF STEM (Left) Patient reports pain as  patient with dementia.  She is alert, but not answering questions .    Objective: Vital signs in last 24 hours: Temp:  [97.6 F (36.4 C)-98.4 F (36.9 C)] 98.4 F (36.9 C) (08/04 [id number]) Pulse Rate:  [77-106] 106 (08/04 0614) Resp:  [12-22] 18 (08/04 0614) BP: (103-135)/(56-85) 123/63 (08/04 0614) SpO2:  [87 %-100 %] 100 % (08/04 0614)  Intake/Output from previous day: 08/03 0701 - 08/04 0700 In: 4130.8 [I.V.:2920.9; Blood:630; IV Piggyback:580] Out: 2050 [Urine:850; Blood:1200] Intake/Output this shift: No intake/output data recorded.  Recent Labs    02/03/21 0148 02/03/21 1510 02/03/21 1617 02/03/21 1708 02/04/21 0344  HGB 9.0* 7.5* 8.2* 8.5* 9.6*   Recent Labs    02/03/21 0148 02/03/21 1510 02/03/21 1708 02/04/21 0344  WBC 10.5  --   --  13.7*  RBC 3.01*  --   --  3.13*  HCT 27.5*   < > 25.0* 28.7*  PLT 232  --   --  185   < > = values in this interval not displayed.   Recent Labs    02/03/21 0148 02/03/21 1510 02/03/21 1617 02/03/21 1708 02/04/21 0344  NA 134*   < > 136 137 133*  K 3.5   < > 4.5 4.1 4.1  CL 99  --  99  --  97*  CO2 28  --   --   --  27  BUN 17  --  17  --  17  CREATININE 0.41*  --  0.30*  --  0.56  GLUCOSE 126*  --  143*  --  144*  CALCIUM 8.5*  --   --   --  8.1*   < > = values in this interval not displayed.   No results for input(s): LABPT, INR in the last 72 hours.  Neurovascular intact Intact pulses distally No cellulitis present Compartment soft Wound vac functioning without any fluid in canister  Assessment/Plan: 1 Day Post-Op Procedure(s) (LRB): ORIF OF LEFT POSTERIOR FEMUR FRACTURE & REVISION OF STEM (Left) Up with therapy WBAT BLE Continue with hip abduction pillow while in bed Continue with wound vac.  Please switch to prevena canister prior to d/c back to SNF Lovenox for dvt  ppx F/u with Dr. Erlinda Hong 1 week after discharge from hospital Posterior hip precautions x 3 months      Latasha Anderson 02/04/2021, 7:47 AM

## 2021-02-04 NOTE — Evaluation (Signed)
Physical Therapy Evaluation Patient Details Name: Latasha Anderson MRN: [id number] DOB: Jul 16, 1930 Today's Date: 02/04/2021   History of Present Illness  87 yo female presents to [facility] on 8/1 s/p fall sustaining L periprosthetic fracture, pt w/c level at baseline. s/p ORIF L greater trochanter fx, subtrochanteric fx, L femoral shaft fx, and revision L hip hemiarthroplasty posterior approach. PMH includes afib, HTN, HLD, dementia, breast cancer, HF, mastectomy, L hemiarthroplasty anterior approach 12/2020.  Clinical Impression  Pt presents with weakness, AMS with history of dementia, max difficulty performing mobility tasks, impaired balance with recent history of falls, and decreased activity tolerance vs baseline. Pt to benefit from acute PT to address deficits. Pt requiring max-total +2 for moving to/from EOB, tolerated EOB sitting without support x5 minutes. Pt and family educated on posterior hip precautions and use of hip abduction pillow, pt's daughter and husband express understanding. PT to progress mobility as tolerated, and will continue to follow acutely.      Follow Up Recommendations SNF    Equipment Recommendations  None recommended by PT    Recommendations for Other Services       Precautions / Restrictions Precautions Precautions: Posterior Hip;Fall Precaution Booklet Issued: No Precaution Comments: Reviewed posterior hip precautions with family - no hip flexion >90 deg, no hip IR past midline, no hip adduction past midline, no crossing ankles. Hip abd pillow to be donned when in bed Required Braces or Orthoses: Other Brace Other Brace: hip abd pillow Restrictions Weight Bearing Restrictions: No RLE Weight Bearing: Weight bearing as tolerated LLE Weight Bearing: Weight bearing as tolerated Other Position/Activity Restrictions: Per orders in chart      Mobility  Bed Mobility Overal bed mobility: Needs Assistance Bed Mobility: Supine to Sit;Sit to Supine     Supine  to sit: Max assist;+2 for physical assistance;+2 for safety/equipment Sit to supine: Max assist;+2 for safety/equipment;+2 for physical assistance   General bed mobility comments: OT/PT utilized helicopter technique to assist pt to coming EOB from supine and back to supine.    Transfers                 General transfer comment: deferred due to pain  Ambulation/Gait                Stairs            Wheelchair Mobility    Modified Rankin (Stroke Patients Only)       Balance Overall balance assessment: Needs assistance Sitting-balance support: Bilateral upper extremity supported;Feet unsupported Sitting balance-Leahy Scale: Fair Sitting balance - Comments: pt sat EOB for 5 mins with no support.       Standing balance comment: deferred at this time                             Pertinent Vitals/Pain Pain Assessment: Faces Faces Pain Scale: Hurts even more Pain Location: L hip and bottom Pain Descriptors / Indicators: Aching;Discomfort;Grimacing;Guarding Pain Intervention(s): Limited activity within patient's tolerance;Monitored during session;Repositioned    Home Living Family/patient expects to be discharged to:: Skilled nursing facility                      Prior Function Level of Independence: Needs assistance   Gait / Transfers Assistance Needed: Pt mainly WC bound, family reported some walking with RW and therapist  ADL's / Homemaking Assistance Needed: All ADL's are assisted by staff at SNF  Comments: Info obtained  from family     Hand Dominance   Dominant Hand: Right    Extremity/Trunk Assessment   Upper Extremity Assessment Upper Extremity Assessment: Defer to OT evaluation    Lower Extremity Assessment Lower Extremity Assessment: Generalized weakness;LLE deficits/detail LLE Deficits / Details: unable to fully assess due to severe pain LLE: Unable to fully assess due to pain    Cervical / Trunk  Assessment Cervical / Trunk Assessment: Kyphotic  Communication   Communication: No difficulties  Cognition Arousal/Alertness: Awake/alert Behavior During Therapy: WFL for tasks assessed/performed Overall Cognitive Status: History of cognitive impairments - at baseline                                 General Comments: Pt has history of dementia, at times swings at and lightly scratches PT in resistance to mobility      General Comments General comments (skin integrity, edema, etc.): hip abduction pillow in place at end of session    Exercises     Assessment/Plan    PT Assessment Patient needs continued PT services  PT Problem List Decreased strength;Decreased mobility;Decreased safety awareness;Decreased cognition;Decreased balance;Decreased activity tolerance;Decreased knowledge of use of DME;Pain;Decreased knowledge of precautions       PT Treatment Interventions DME instruction;Therapeutic activities;Patient/family education;Therapeutic exercise;Gait training;Balance training;Functional mobility training;Neuromuscular re-education    PT Goals (Current goals can be found in the Care Plan section)  Acute Rehab PT Goals Patient Stated Goal: to go back to bed PT Goal Formulation: Patient unable to participate in goal setting Time For Goal Achievement: 02/18/21 Potential to Achieve Goals: Good    Frequency Min 3X/week   Barriers to discharge        Co-evaluation PT/OT/SLP Co-Evaluation/Treatment: Yes Reason for Co-Treatment: For patient/therapist safety;To address functional/ADL transfers;Necessary to address cognition/behavior during functional activity PT goals addressed during session: Mobility/safety with mobility;Balance OT goals addressed during session: ADL's and self-care       AM-PAC PT "6 Clicks" Mobility  Outcome Measure Help needed turning from your back to your side while in a flat bed without using bedrails?: Total Help needed moving from  lying on your back to sitting on the side of a flat bed without using bedrails?: Total Help needed moving to and from a bed to a chair (including a wheelchair)?: Total Help needed standing up from a chair using your arms (e.g., wheelchair or bedside chair)?: Total Help needed to walk in hospital room?: Total Help needed climbing 3-5 steps with a railing? : Total 6 Click Score: 6    End of Session   Activity Tolerance: Patient limited by fatigue Patient left: in bed;with call bell/phone within reach;with bed alarm set;with family/visitor present Nurse Communication: Mobility status PT Visit Diagnosis: Other abnormalities of gait and mobility (R26.89);Pain Pain - Right/Left: Left Pain - part of body: Hip;Leg    Time: [id number] PT Time Calculation (min) (ACUTE ONLY): 28 min   Charges:   PT Evaluation $PT Eval Low Complexity: 1 Low         Latasha Anderson S, PT DPT Acute Rehabilitation Services Pager [phone]  Office [phone]   Bastrop Ruffin Pyo 02/04/2021, 2:44 PM

## 2021-02-04 NOTE — Plan of Care (Signed)
  Problem: Health Behavior/Discharge Planning: Goal: Ability to manage health-related needs will improve Outcome: Progressing   Problem: Clinical Measurements: Goal: Ability to maintain clinical measurements within normal limits will improve Outcome: Progressing Goal: Will remain free from infection Outcome: Progressing   Problem: Health Behavior/Discharge Planning: Goal: Ability to manage health-related needs will improve Outcome: Progressing   Problem: Clinical Measurements: Goal: Ability to maintain clinical measurements within normal limits will improve Outcome: Progressing Goal: Will remain free from infection Outcome: Progressing

## 2021-02-05 LAB — CBC
HCT: 24.1 % — ABNORMAL LOW (ref 36.0–46.0)
Hemoglobin: 8.1 g/dL — ABNORMAL LOW (ref 12.0–15.0)
MCH: 30.9 pg (ref 26.0–34.0)
MCHC: 33.6 g/dL (ref 30.0–36.0)
MCV: 92 fL (ref 80.0–100.0)
Platelets: 194 10*3/uL (ref 150–400)
RBC: 2.62 MIL/uL — ABNORMAL LOW (ref 3.87–5.11)
RDW: 14.6 % (ref 11.5–15.5)
WBC: 13.1 10*3/uL — ABNORMAL HIGH (ref 4.0–10.5)
nRBC: 0 % (ref 0.0–0.2)

## 2021-02-05 LAB — BASIC METABOLIC PANEL
Anion gap: 4 — ABNORMAL LOW (ref 5–15)
BUN: 19 mg/dL (ref 8–23)
CO2: 28 mmol/L (ref 22–32)
Calcium: 8.3 mg/dL — ABNORMAL LOW (ref 8.9–10.3)
Chloride: 102 mmol/L (ref 98–111)
Creatinine, Ser: 0.45 mg/dL (ref 0.44–1.00)
GFR, Estimated: >60 mL/min (ref >60)
Glucose, Bld: 165 mg/dL — ABNORMAL HIGH (ref 70–99)
Potassium: 3.8 mmol/L (ref 3.5–5.1)
Sodium: 134 mmol/L — ABNORMAL LOW (ref 135–145)

## 2021-02-05 NOTE — TOC Progression Note (Addendum)
Transition of Care [facility]) - Progression Note    Patient Details  Name: Latasha Anderson MRN: [id number] Date of Birth: 01/24/1931  [facility] CM/SW Contact  Milinda Antis, LCSWA Phone Number: 02/05/2021, 1:21 PM  Clinical Narrative:     10:33- CSW informed attending that the insurance company is requesting a peer to peer for SNF today by 1430.  MD to call [phone] option 5.  15:56-  CSW notified by attending that insurance denied SNF placement.  CSW left message with [name] informing them of the determination.  [facility] requested that the patient be transferred back to the facility on Monday if patient is medically ready.  Attending notified.   Expected Discharge Plan and Services                                                 Social Determinants of Health (SDOH) Interventions    Readmission Risk Interventions No flowsheet data found.

## 2021-02-05 NOTE — Progress Notes (Signed)
PROGRESS NOTE    Latasha Anderson  [id number] DOB: 01-Aug-1930 DOA: 02/01/2021 PCP: Jerrol Banana., MD  Brief Narrative:Latasha Anderson is an 87 y.o. female with medical history significant of A Fib, HTN, HLD, dementia, who presented from her SNF after witnessed fall trying to stand from wheelchair. She is wheelchair bound at baseline with advanced dementia, is oriented to self only, does not answer questions appropriately, can feed self with fingers but doesn't eat much, is otherwise total care at baseline.In ED, Xray showed LEFT hip prosthesis with acute mildly displaced periprosthetic fracture of the proximal LEFT femur   Assessment & Plan:   Left periprosthetic fracture of left femur after mechanical fall -Orthopedic surgery consulted, Dr.Xu discussed options with family  -Underwent ORIF of left posterior femur fracture and revision of STEM 08/3 -she is mostly wheelchair-bound and total care at SNF -Pain control, decrease narcotics to prevent oversedation -lovenox for DVT proph -PT OT eval -Will need to go back to SNF  Acute blood loss anemia -Transfused 2 units of PRBC Intra-Op,  -Mild drop in hemoglobin, monitor  Chronic Foley, urinary retention -Foley catheter changed 8/4  Leukocytosis, abnormal urinalysis -Day 4 of IV ceftriaxone, urine culture got canceled for unclear reasons -Will discontinue ceftriaxone tomorrow   Chronic A Fib -Hold aspirin -Continue cardizem    HLD -Continue lipitor    Advanced dementia -Continue seroquel   DVT prophylaxis: Lovenox Code Status: DNR Family Communication: Husband at bedside Disposition Plan:  Status is: Inpatient  Remains inpatient appropriate because:Inpatient level of care appropriate due to severity of illness  Dispo: The patient is from: SNF              Anticipated d/c is to: SNF              Patient currently is not medically stable to d/c.   Difficult to place patient No   Consultants:   Orthopedics  Procedures:   PROCEDURE:  Open reduction internal fixation of left greater trochanter fracture Open reduction internal fixation of left subtrochanteric fracture using separate fixation Open reduction internal fixation of left femoral shaft fracture using separate fixation Removal of left hip hemiarthroplasty Revision left hip hemiarthroplasty Application of incisional wound VAC  Antimicrobials:    Subjective: -More sleepy this morning after pain medicines  Objective: Vitals:   02/04/21 0749 02/04/21 1141 02/04/21 2256 02/05/21 0700  BP: 124/76 114/89 112/67 126/70  Pulse: 90 100 86 90  Resp: '16 16 16 18  '$ Temp: 98.5 F (36.9 C) 98 F (36.7 C) 98.6 F (37 C) 98.9 F (37.2 C)  TempSrc: Oral  Oral Oral  SpO2: 96% 97% 97% 96%  Weight:      Height:        Intake/Output Summary (Last 24 hours) at 02/05/2021 1247 Last data filed at 02/05/2021 0400 Gross per 24 hour  Intake 120 ml  Output --  Net 120 ml   Filed Weights   02/01/21 1448  Weight: 65 kg    Examination:  General exam: Chronically ill elderly female sitting up in bed, somnolent, easily arousable, answers a few questions, oriented to self only CVS: S1-S2, regular rate rhythm Lungs: Decreased breath sounds to bases Abdomen: Soft, nontender, bowel sounds present Extremities: Trace foot edema, right > left  Skin: No rash on exposed skin Psych: Advanced dementia   Data Reviewed:   CBC: Recent Labs  Lab 02/01/21 1509 02/02/21 0155 02/03/21 0148 02/03/21 1510 02/03/21 1617 02/03/21 1708 02/04/21 0344 02/05/21 0248  WBC 17.1* 11.4* 10.5  --   --   --  13.7* 13.1*  NEUTROABS 14.3*  --   --   --   --   --   --   --   HGB 14.5 11.7* 9.0* 7.5* 8.2* 8.5* 9.6* 8.1*  HCT 45.4 36.8 27.5* 22.0* 24.0* 25.0* 28.7* 24.1*  MCV 93.8 91.8 91.4  --   --   --  91.7 92.0  PLT 388 359 232  --   --   --  185 [id number]   Basic Metabolic Panel: Recent Labs  Lab 02/01/21 1509 02/02/21 0155 02/03/21 0148  02/03/21 1510 02/03/21 1617 02/03/21 1708 02/04/21 0344 02/05/21 0248  NA 136 134* 134* 134* 136 137 133* 134*  K 4.5 4.1 3.5 4.1 4.5 4.1 4.1 3.8  CL 97* 97* 99  --  99  --  97* 102  CO2 '30 28 28  '$ --   --   --  27 28  GLUCOSE 117* 186* 126*  --  143*  --  144* 165*  BUN '12 14 17  '$ --  17  --  17 19  CREATININE 0.58 0.67 0.41*  --  0.30*  --  0.56 0.45  CALCIUM 9.2 9.0 8.5*  --   --   --  8.1* 8.3*   GFR: Estimated Creatinine Clearance: 45.5 mL/min (by C-G formula based on SCr of 0.45 mg/dL). Liver Function Tests: Recent Labs  Lab 02/01/21 1509  AST 34  ALT 12  ALKPHOS 77  BILITOT 0.5  PROT 6.5  ALBUMIN 3.4*   No results for input(s): LIPASE, AMYLASE in the last 168 hours. No results for input(s): AMMONIA in the last 168 hours. Coagulation Profile: No results for input(s): INR, PROTIME in the last 168 hours. Cardiac Enzymes: No results for input(s): CKTOTAL, CKMB, CKMBINDEX, TROPONINI in the last 168 hours. BNP (last 3 results) No results for input(s): PROBNP in the last 8760 hours. HbA1C: No results for input(s): HGBA1C in the last 72 hours. CBG: No results for input(s): GLUCAP in the last 168 hours. Lipid Profile: No results for input(s): CHOL, HDL, LDLCALC, TRIG, CHOLHDL, LDLDIRECT in the last 72 hours. Thyroid Function Tests: No results for input(s): TSH, T4TOTAL, FREET4, T3FREE, THYROIDAB in the last 72 hours. Anemia Panel: No results for input(s): VITAMINB12, FOLATE, FERRITIN, TIBC, IRON, RETICCTPCT in the last 72 hours. Urine analysis:    Component Value Date/Time   COLORURINE AMBER (A) 02/01/2021 1631   APPEARANCEUR CLOUDY (A) 02/01/2021 1631   LABSPEC 1.016 02/01/2021 1631   PHURINE 6.0 02/01/2021 1631   GLUCOSEU NEGATIVE 02/01/2021 1631   HGBUR LARGE (A) 02/01/2021 1631   BILIRUBINUR NEGATIVE 02/01/2021 1631   KETONESUR NEGATIVE 02/01/2021 1631   PROTEINUR 100 (A) 02/01/2021 1631   NITRITE POSITIVE (A) 02/01/2021 1631   LEUKOCYTESUR LARGE (A)  02/01/2021 1631   Sepsis Labs: '@LABRCNTIP'$ (procalcitonin:4,lacticidven:4)  ) Recent Results (from the past 240 hour(s))  Resp Panel by RT-PCR (Flu A&B, Covid) Nasopharyngeal Swab     Status: None   Collection Time: 02/01/21  3:09 PM   Specimen: Nasopharyngeal Swab; Nasopharyngeal(NP) swabs in vial transport medium  Result Value Ref Range Status   SARS Coronavirus 2 by RT PCR NEGATIVE NEGATIVE Final    Comment: (NOTE) SARS-CoV-2 target nucleic acids are NOT DETECTED.  The SARS-CoV-2 RNA is generally detectable in upper respiratory specimens during the acute phase of infection. The lowest concentration of SARS-CoV-2 viral copies this assay can detect is 138 copies/mL. A negative result  does not preclude SARS-Cov-2 infection and should not be used as the sole basis for treatment or other patient management decisions. A negative result may occur with  improper specimen collection/handling, submission of specimen other than nasopharyngeal swab, presence of viral mutation(s) within the areas targeted by this assay, and inadequate number of viral copies(<138 copies/mL). A negative result must be combined with clinical observations, patient history, and epidemiological information. The expected result is Negative.  Fact Sheet for Patients:  EntrepreneurPulse.com.au  Fact Sheet for Healthcare Providers:  IncredibleEmployment.be  This test is no t yet approved or cleared by the [place] FDA and  has been authorized for detection and/or diagnosis of SARS-CoV-2 by FDA under an Emergency Use Authorization (EUA). This EUA will remain  in effect (meaning this test can be used) for the duration of the COVID-19 declaration under Section 564(b)(1) of the Act, 21 U.S.C.section 360bbb-3(b)(1), unless the authorization is terminated  or revoked sooner.       Influenza A by PCR NEGATIVE NEGATIVE Final   Influenza B by PCR NEGATIVE NEGATIVE Final     Comment: (NOTE) The Xpert Xpress SARS-CoV-2/FLU/RSV plus assay is intended as an aid in the diagnosis of influenza from Nasopharyngeal swab specimens and should not be used as a sole basis for treatment. Nasal washings and aspirates are unacceptable for Xpert Xpress SARS-CoV-2/FLU/RSV testing.  Fact Sheet for Patients: EntrepreneurPulse.com.au  Fact Sheet for Healthcare Providers: IncredibleEmployment.be  This test is not yet approved or cleared by the [place] FDA and has been authorized for detection and/or diagnosis of SARS-CoV-2 by FDA under an Emergency Use Authorization (EUA). This EUA will remain in effect (meaning this test can be used) for the duration of the COVID-19 declaration under Section 564(b)(1) of the Act, 21 U.S.C. section 360bbb-3(b)(1), unless the authorization is terminated or revoked.  Performed at [facility], [place] [address], [place], [place] [address]   Surgical pcr screen     Status: None   Collection Time: 02/02/21  3:52 PM   Specimen: Nasal Mucosa; Nasal Swab  Result Value Ref Range Status   MRSA, PCR NEGATIVE NEGATIVE Final   Staphylococcus aureus NEGATIVE NEGATIVE Final    Comment: (NOTE) The Xpert SA Assay (FDA approved for NASAL specimens in patients 53 years of age and older), is one component of a comprehensive surveillance program. It is not intended to diagnose infection nor to guide or monitor treatment. Performed at [facility], [place] [address], [place], [place] [address]          Radiology Studies: Pelvis Portable  Result Date: 02/03/2021 CLINICAL DATA:  87 year old female status post ORIF of the left hip. EXAM: PORTABLE PELVIS 1-2 VIEWS COMPARISON:  Left hip CT dated 02/02/2021. FINDINGS: Total left hip arthroplasty with cerclage wires through the greater trochanter and proximal femur. The fracture fragments of the proximal femur appear in near anatomic alignment.  The arthroplasty components appear intact and in anatomic alignment. Fracture of the greater trochanter of the left femur with cerclage wires. The bones are osteopenic. There is no dislocation. Degenerative changes of the right hip. Postsurgical changes in the soft tissues of the left hip with cutaneous staples. IMPRESSION: Total left hip arthroplasty. Electronically Signed   By: Anner Crete M.D.   On: 02/03/2021 20:19   DG C-Arm 1-60 Min-No Report  Result Date: 02/03/2021 Fluoroscopy was utilized by the requesting physician.  No radiographic interpretation.   DG FEMUR PORT MIN 2 VIEWS LEFT  Result Date: 02/03/2021  CLINICAL DATA:  ORIF left femoral fracture EXAM: LEFT FEMUR PORTABLE 2 VIEWS COMPARISON:  02/01/2021 FINDINGS: Frontal and lateral views of the proximal aspect of the left femur are obtained. There has been revision of the femoral component of the left hip arthroplasty, with a long stem component and multiple cerclage wires identified within the proximal left femur. Previous fracture fragment is in anatomic alignment. Postsurgical changes are seen in the overlying soft tissues. IMPRESSION: 1. Revision of femoral component of the left hip arthroplasty, with multiple cerclage wire surrounding the previous fracture fragment. Alignment is anatomic. Electronically Signed   By: Randa Ngo M.D.   On: 02/03/2021 20:18     Scheduled Meds:  atorvastatin  10 mg Oral Daily   Chlorhexidine Gluconate Cloth  6 each Topical Q0600   cholecalciferol  1,000 Units Oral Q1200   diltiazem  60 mg Oral Q12H   docusate sodium  100 mg Oral BID   enoxaparin (LOVENOX) injection  40 mg Subcutaneous Q24H   feeding supplement  237 mL Oral BID BM   ferrous sulfate  325 mg Oral TID PC   latanoprost  1 drop Both Eyes Q1200   multivitamin with minerals  1 tablet Oral Daily   QUEtiapine  25 mg Oral QHS   senna  1 tablet Oral BID   sodium chloride flush  3 mL Intravenous Q12H   timolol  1 drop Both Eyes Q1200    Continuous Infusions:  sodium chloride     cefTRIAXone (ROCEPHIN)  IV 1 g (02/05/21 0948)   methocarbamol (ROBAXIN) IV       LOS: 4 days   Time spent: 71mn  PDomenic Polite MD [facility]   02/05/2021, 12:47 PM

## 2021-02-05 NOTE — Progress Notes (Signed)
Subjective: 2 Days Post-Op Procedure(s) (LRB): ORIF OF LEFT POSTERIOR FEMUR FRACTURE & REVISION OF STEM (Left) Patient is  demented and unable to answer questions .    Objective: Vital signs in last 24 hours: Temp:  [98 F (36.7 C)-98.9 F (37.2 C)] 98.9 F (37.2 C) (08/05 0700) Pulse Rate:  [86-100] 90 (08/05 0700) Resp:  [16-18] 18 (08/05 0700) BP: (112-126)/(67-89) 126/70 (08/05 0700) SpO2:  [96 %-97 %] 96 % (08/05 0700)  Intake/Output from previous day: 08/04 0701 - 08/05 0700 In: 120 [P.O.:120] Out: -  Intake/Output this shift: No intake/output data recorded.  Recent Labs    02/03/21 1510 02/03/21 1617 02/03/21 1708 02/04/21 0344 02/05/21 0248  HGB 7.5* 8.2* 8.5* 9.6* 8.1*   Recent Labs    02/04/21 0344 02/05/21 0248  WBC 13.7* 13.1*  RBC 3.13* 2.62*  HCT 28.7* 24.1*  PLT 185 194   Recent Labs    02/04/21 0344 02/05/21 0248  NA 133* 134*  K 4.1 3.8  CL 97* 102  CO2 27 28  BUN 17 19  CREATININE 0.56 0.45  GLUCOSE 144* 165*  CALCIUM 8.1* 8.3*   No results for input(s): LABPT, INR in the last 72 hours.  Intact pulses distally Incision: dressing C/D/I No cellulitis present Compartment soft Wound vac in place and functioning properly.  25cc serous fluid in canister   Assessment/Plan: 2 Days Post-Op Procedure(s) (LRB): ORIF OF LEFT POSTERIOR FEMUR FRACTURE & REVISION OF STEM (Left) Up with therapy WBAT BLE Continue with hip abduction pillow while in bed Continue with wound vac.  Please switch to prevena canister prior to d/c back to SNF Lovenox for dvt ppx F/u with Dr. Erlinda Hong 1 week after discharge from hospital Posterior hip precautions x 3 months Resume norco at discharge as previously presribed Lovenox rx in chart         Latasha Anderson 02/05/2021, 7:53 AM

## 2021-02-05 NOTE — Care Management Important Message (Signed)
Important Message  Patient Details  Name: Latasha Anderson MRN: [id number] Date of Birth: Oct 03, 1930   Medicare Important Message Given:  Yes     Orbie Pyo 02/05/2021, 10:34 AM

## 2021-02-06 LAB — BASIC METABOLIC PANEL
Anion gap: 6 (ref 5–15)
BUN: 19 mg/dL (ref 8–23)
CO2: 25 mmol/L (ref 22–32)
Calcium: 8.5 mg/dL — ABNORMAL LOW (ref 8.9–10.3)
Chloride: 102 mmol/L (ref 98–111)
Creatinine, Ser: 0.4 mg/dL — ABNORMAL LOW (ref 0.44–1.00)
GFR, Estimated: >60 mL/min (ref >60)
Glucose, Bld: 133 mg/dL — ABNORMAL HIGH (ref 70–99)
Potassium: 4.1 mmol/L (ref 3.5–5.1)
Sodium: 133 mmol/L — ABNORMAL LOW (ref 135–145)

## 2021-02-06 LAB — CBC
HCT: 24.5 % — ABNORMAL LOW (ref 36.0–46.0)
Hemoglobin: 8.1 g/dL — ABNORMAL LOW (ref 12.0–15.0)
MCH: 31 pg (ref 26.0–34.0)
MCHC: 33.1 g/dL (ref 30.0–36.0)
MCV: 93.9 fL (ref 80.0–100.0)
Platelets: 227 10*3/uL (ref 150–400)
RBC: 2.61 MIL/uL — ABNORMAL LOW (ref 3.87–5.11)
RDW: 14.8 % (ref 11.5–15.5)
WBC: 13.3 10*3/uL — ABNORMAL HIGH (ref 4.0–10.5)
nRBC: 0 % (ref 0.0–0.2)

## 2021-02-06 MED ORDER — FUROSEMIDE 10 MG/ML IJ SOLN
20.0000 mg | Freq: Once | INTRAMUSCULAR | Status: AC
  Administered 2021-02-06: 20 mg via INTRAVENOUS
  Filled 2021-02-06: qty 2

## 2021-02-06 NOTE — Progress Notes (Signed)
PROGRESS NOTE    Latasha Anderson  [id number] DOB: 1931/02/14 DOA: 02/01/2021 PCP: Jerrol Banana., MD  Brief Narrative:Latasha Anderson is an 87 y.o. female with medical history significant of A Fib, HTN, HLD, dementia, who presented from her SNF after witnessed fall trying to stand from wheelchair. She is wheelchair bound at baseline with advanced dementia, is oriented to self only, does not answer questions appropriately, can feed self with fingers but doesn't eat much, is otherwise total care at baseline.In ED, Xray showed LEFT hip prosthesis with acute mildly displaced periprosthetic fracture of the proximal LEFT femur   Assessment & Plan:   Left periprosthetic fracture of left femur after mechanical fall -Orthopedic surgery consulted, Dr.Xu discussed options with family  -Underwent ORIF of left posterior femur fracture and revision of STEM 08/3 -she is mostly wheelchair-bound and total care at SNF -Pain control, decrease narcotics to prevent oversedation -lovenox for DVT proph -PT OT eval completed, SNF recommended -Discharge planning  Acute blood loss anemia -Transfused 2 units of PRBC Intra-Op,  -Hemoglobin relatively stable  Chronic Foley, urinary retention -Foley catheter changed 8/4  Chronic lower extremity edema -Worsening postop, IV Lasix x1 today -Recommend compression stockings at SNF  UTI Leukocytosis, abnormal urinalysis -Completed 5 days of ceftriaxone, urine culture got canceled for unclear reasons, will discontinue antibiotics today   Chronic A Fib -Hold aspirin -Continue cardizem    HLD -Continue lipitor    Advanced dementia -Continue seroquel   DVT prophylaxis: Lovenox Code Status: DNR Family Communication: Husband at bedside yesterday Disposition Plan:  Status is: Inpatient  Remains inpatient appropriate because:Inpatient level of care appropriate due to severity of illness  Dispo: The patient is from: SNF               Anticipated d/c is to: SNF              Patient currently is not medically stable to d/c.   Difficult to place patient No   Consultants:  Orthopedics  Procedures:   PROCEDURE:  Open reduction internal fixation of left greater trochanter fracture Open reduction internal fixation of left subtrochanteric fracture using separate fixation Open reduction internal fixation of left femoral shaft fracture using separate fixation Removal of left hip hemiarthroplasty Revision left hip hemiarthroplasty Application of incisional wound VAC  Antimicrobials:    Subjective: -More sleepy this morning after pain medicines  Objective: Vitals:   02/04/21 2256 02/05/21 0700 02/05/21 2110 02/06/21 0742  BP: 112/67 126/70 (!) 136/111 115/60  Pulse: 86 90 96 91  Resp: '16 18 19 18  '$ Temp: 98.6 F (37 C) 98.9 F (37.2 C) (!) 97.5 F (36.4 C) 98.4 F (36.9 C)  TempSrc: Oral Oral Oral Oral  SpO2: 97% 96% 98% 96%  Weight:      Height:        Intake/Output Summary (Last 24 hours) at 02/06/2021 1120 Last data filed at 02/06/2021 1100 Gross per 24 hour  Intake 63 ml  Output 350 ml  Net -287 ml   Filed Weights   02/01/21 1448  Weight: 65 kg    Examination:  General exam: Chronically ill elderly female, laying in bed, somnolent easily arousable, answers a few questions, oriented to self only, pleasantly confused CVS: S1-S2, regular rate rhythm Lungs: Decreased breath sounds to bases  Abdomen: Soft, nontender, bowel sounds present  Extremities: 2+ edema,  Skin: No rash on exposed skin Psych: Advanced dementia   Data Reviewed:   CBC: Recent Labs  Lab  02/01/21 1509 02/02/21 0155 02/03/21 0148 02/03/21 1510 02/03/21 1617 02/03/21 1708 02/04/21 0344 02/05/21 0248 02/06/21 0218  WBC 17.1* 11.4* 10.5  --   --   --  13.7* 13.1* 13.3*  NEUTROABS 14.3*  --   --   --   --   --   --   --   --   HGB 14.5 11.7* 9.0*   < > 8.2* 8.5* 9.6* 8.1* 8.1*  HCT 45.4 36.8 27.5*   < > 24.0* 25.0* 28.7*  24.1* 24.5*  MCV 93.8 91.8 91.4  --   --   --  91.7 92.0 93.9  PLT 388 359 232  --   --   --  185 194 227   < > = values in this interval not displayed.   Basic Metabolic Panel: Recent Labs  Lab 02/02/21 0155 02/03/21 0148 02/03/21 1510 02/03/21 1617 02/03/21 1708 02/04/21 0344 02/05/21 0248 02/06/21 0218  NA 134* 134*   < > 136 137 133* 134* 133*  K 4.1 3.5   < > 4.5 4.1 4.1 3.8 4.1  CL 97* 99  --  99  --  97* 102 102  CO2 28 28  --   --   --  '27 28 25  '$ GLUCOSE 186* 126*  --  143*  --  144* 165* 133*  BUN 14 17  --  17  --  '17 19 19  '$ CREATININE 0.67 0.41*  --  0.30*  --  0.56 0.45 0.40*  CALCIUM 9.0 8.5*  --   --   --  8.1* 8.3* 8.5*   < > = values in this interval not displayed.   GFR: Estimated Creatinine Clearance: 45.5 mL/min (A) (by C-G formula based on SCr of 0.4 mg/dL (L)). Liver Function Tests: Recent Labs  Lab 02/01/21 1509  AST 34  ALT 12  ALKPHOS 77  BILITOT 0.5  PROT 6.5  ALBUMIN 3.4*   No results for input(s): LIPASE, AMYLASE in the last 168 hours. No results for input(s): AMMONIA in the last 168 hours. Coagulation Profile: No results for input(s): INR, PROTIME in the last 168 hours. Cardiac Enzymes: No results for input(s): CKTOTAL, CKMB, CKMBINDEX, TROPONINI in the last 168 hours. BNP (last 3 results) No results for input(s): PROBNP in the last 8760 hours. HbA1C: No results for input(s): HGBA1C in the last 72 hours. CBG: No results for input(s): GLUCAP in the last 168 hours. Lipid Profile: No results for input(s): CHOL, HDL, LDLCALC, TRIG, CHOLHDL, LDLDIRECT in the last 72 hours. Thyroid Function Tests: No results for input(s): TSH, T4TOTAL, FREET4, T3FREE, THYROIDAB in the last 72 hours. Anemia Panel: No results for input(s): VITAMINB12, FOLATE, FERRITIN, TIBC, IRON, RETICCTPCT in the last 72 hours. Urine analysis:    Component Value Date/Time   COLORURINE AMBER (A) 02/01/2021 1631   APPEARANCEUR CLOUDY (A) 02/01/2021 1631   LABSPEC 1.016  02/01/2021 1631   PHURINE 6.0 02/01/2021 1631   GLUCOSEU NEGATIVE 02/01/2021 1631   HGBUR LARGE (A) 02/01/2021 1631   BILIRUBINUR NEGATIVE 02/01/2021 1631   KETONESUR NEGATIVE 02/01/2021 1631   PROTEINUR 100 (A) 02/01/2021 1631   NITRITE POSITIVE (A) 02/01/2021 1631   LEUKOCYTESUR LARGE (A) 02/01/2021 1631   Sepsis Labs: '@LABRCNTIP'$ (procalcitonin:4,lacticidven:4)  ) Recent Results (from the past 240 hour(s))  Resp Panel by RT-PCR (Flu A&B, Covid) Nasopharyngeal Swab     Status: None   Collection Time: 02/01/21  3:09 PM   Specimen: Nasopharyngeal Swab; Nasopharyngeal(NP) swabs in vial transport medium  Result Value Ref Range Status   SARS Coronavirus 2 by RT PCR NEGATIVE NEGATIVE Final    Comment: (NOTE) SARS-CoV-2 target nucleic acids are NOT DETECTED.  The SARS-CoV-2 RNA is generally detectable in upper respiratory specimens during the acute phase of infection. The lowest concentration of SARS-CoV-2 viral copies this assay can detect is 138 copies/mL. A negative result does not preclude SARS-Cov-2 infection and should not be used as the sole basis for treatment or other patient management decisions. A negative result may occur with  improper specimen collection/handling, submission of specimen other than nasopharyngeal swab, presence of viral mutation(s) within the areas targeted by this assay, and inadequate number of viral copies(<138 copies/mL). A negative result must be combined with clinical observations, patient history, and epidemiological information. The expected result is Negative.  Fact Sheet for Patients:  EntrepreneurPulse.com.au  Fact Sheet for Healthcare Providers:  IncredibleEmployment.be  This test is no t yet approved or cleared by the [place] FDA and  has been authorized for detection and/or diagnosis of SARS-CoV-2 by FDA under an Emergency Use Authorization (EUA). This EUA will remain  in effect (meaning this  test can be used) for the duration of the COVID-19 declaration under Section 564(b)(1) of the Act, 21 U.S.C.section 360bbb-3(b)(1), unless the authorization is terminated  or revoked sooner.       Influenza A by PCR NEGATIVE NEGATIVE Final   Influenza B by PCR NEGATIVE NEGATIVE Final    Comment: (NOTE) The Xpert Xpress SARS-CoV-2/FLU/RSV plus assay is intended as an aid in the diagnosis of influenza from Nasopharyngeal swab specimens and should not be used as a sole basis for treatment. Nasal washings and aspirates are unacceptable for Xpert Xpress SARS-CoV-2/FLU/RSV testing.  Fact Sheet for Patients: EntrepreneurPulse.com.au  Fact Sheet for Healthcare Providers: IncredibleEmployment.be  This test is not yet approved or cleared by the [place] FDA and has been authorized for detection and/or diagnosis of SARS-CoV-2 by FDA under an Emergency Use Authorization (EUA). This EUA will remain in effect (meaning this test can be used) for the duration of the COVID-19 declaration under Section 564(b)(1) of the Act, 21 U.S.C. section 360bbb-3(b)(1), unless the authorization is terminated or revoked.  Performed at [facility] [place] [address] [place], [place] [address]   Surgical pcr screen     Status: None   Collection Time: 02/02/21  3:52 PM   Specimen: Nasal Mucosa; Nasal Swab  Result Value Ref Range Status   MRSA, PCR NEGATIVE NEGATIVE Final   Staphylococcus aureus NEGATIVE NEGATIVE Final    Comment: (NOTE) The Xpert SA Assay (FDA approved for NASAL specimens in patients 27 years of age and older), is one component of a comprehensive surveillance program. It is not intended to diagnose infection nor to guide or monitor treatment. Performed at [facility], [place] [address], [place], [place] [address]     Radiology Studies: No results found.   Scheduled Meds:  atorvastatin  10 mg Oral Daily   Chlorhexidine  Gluconate Cloth  6 each Topical Q0600   cholecalciferol  1,000 Units Oral Q1200   diltiazem  60 mg Oral Q12H   docusate sodium  100 mg Oral BID   enoxaparin (LOVENOX) injection  40 mg Subcutaneous Q24H   feeding supplement  237 mL Oral BID BM   ferrous sulfate  325 mg Oral TID PC   latanoprost  1 drop Both Eyes Q1200   multivitamin with minerals  1 tablet Oral Daily   QUEtiapine  25 mg Oral QHS   senna  1 tablet Oral BID   sodium chloride flush  3 mL Intravenous Q12H   timolol  1 drop Both Eyes Q1200   Continuous Infusions:  sodium chloride     methocarbamol (ROBAXIN) IV       LOS: 5 days   Time spent: 72mn  PDomenic Polite MD [facility]   02/06/2021, 11:20 AM

## 2021-02-06 NOTE — Plan of Care (Signed)
  Problem: Clinical Measurements: Goal: Ability to maintain clinical measurements within normal limits will improve Outcome: Progressing   Problem: Education: Goal: Knowledge of General Education information will improve Description: Including pain rating scale, medication(s)/side effects and non-pharmacologic comfort measures Outcome: Progressing   Problem: Health Behavior/Discharge Planning: Goal: Ability to manage health-related needs will improve Outcome: Progressing   Problem: Clinical Measurements: Goal: Ability to maintain clinical measurements within normal limits will improve Outcome: Progressing   Problem: Activity: Goal: Risk for activity intolerance will decrease Outcome: Progressing   Problem: Nutrition: Goal: Adequate nutrition will be maintained Outcome: Progressing   Problem: Coping: Goal: Level of anxiety will decrease Outcome: Progressing   Problem: Pain Managment: Goal: General experience of comfort will improve Outcome: Progressing   Problem: Safety: Goal: Ability to remain free from injury will improve Outcome: Progressing   Problem: Skin Integrity: Goal: Risk for impaired skin integrity will decrease Outcome: Progressing

## 2021-02-06 NOTE — Plan of Care (Signed)
  Problem: Health Behavior/Discharge Planning: Goal: Ability to manage health-related needs will improve Outcome: Progressing   Problem: Clinical Measurements: Goal: Ability to maintain clinical measurements within normal limits will improve Outcome: Progressing Goal: Will remain free from infection Outcome: Progressing   

## 2021-02-07 LAB — RETICULOCYTES
Immature Retic Fract: 34 % — ABNORMAL HIGH (ref 2.3–15.9)
RBC.: 2.59 MIL/uL — ABNORMAL LOW (ref 3.87–5.11)
Retic Count, Absolute: 151.5 10*3/uL (ref 19.0–186.0)
Retic Ct Pct: 5.9 % — ABNORMAL HIGH (ref 0.4–3.1)

## 2021-02-07 LAB — CBC
HCT: 23.6 % — ABNORMAL LOW (ref 36.0–46.0)
Hemoglobin: 7.5 g/dL — ABNORMAL LOW (ref 12.0–15.0)
MCH: 30.4 pg (ref 26.0–34.0)
MCHC: 31.8 g/dL (ref 30.0–36.0)
MCV: 95.5 fL (ref 80.0–100.0)
Platelets: 267 10*3/uL (ref 150–400)
RBC: 2.47 MIL/uL — ABNORMAL LOW (ref 3.87–5.11)
RDW: 15.2 % (ref 11.5–15.5)
WBC: 11.7 10*3/uL — ABNORMAL HIGH (ref 4.0–10.5)
nRBC: 0 % (ref 0.0–0.2)

## 2021-02-07 LAB — BASIC METABOLIC PANEL
Anion gap: 7 (ref 5–15)
BUN: 19 mg/dL (ref 8–23)
CO2: 27 mmol/L (ref 22–32)
Calcium: 8.2 mg/dL — ABNORMAL LOW (ref 8.9–10.3)
Chloride: 98 mmol/L (ref 98–111)
Creatinine, Ser: 0.4 mg/dL — ABNORMAL LOW (ref 0.44–1.00)
GFR, Estimated: >60 mL/min (ref >60)
Glucose, Bld: 101 mg/dL — ABNORMAL HIGH (ref 70–99)
Potassium: 4.3 mmol/L (ref 3.5–5.1)
Sodium: 132 mmol/L — ABNORMAL LOW (ref 135–145)

## 2021-02-07 LAB — IRON AND TIBC
Iron: 14 ug/dL — ABNORMAL LOW (ref 28–170)
Saturation Ratios: 8 % — ABNORMAL LOW (ref 10.4–31.8)
TIBC: 183 ug/dL — ABNORMAL LOW (ref 250–450)
UIBC: 169 ug/dL

## 2021-02-07 LAB — VITAMIN B12: Vitamin B-12: 531 pg/mL (ref 180–914)

## 2021-02-07 LAB — FERRITIN: Ferritin: 182 ng/mL (ref 11–307)

## 2021-02-07 LAB — FOLATE: Folate: 13.6 ng/mL (ref >5.9)

## 2021-02-07 MED ORDER — SODIUM CHLORIDE 0.9 % IV SOLN
250.0000 mg | Freq: Every day | INTRAVENOUS | Status: AC
  Administered 2021-02-07 – 2021-02-08 (×2): 250 mg via INTRAVENOUS
  Filled 2021-02-07 (×3): qty 20

## 2021-02-07 MED ORDER — FUROSEMIDE 10 MG/ML IJ SOLN
20.0000 mg | Freq: Once | INTRAMUSCULAR | Status: AC
  Administered 2021-02-07: 20 mg via INTRAVENOUS
  Filled 2021-02-07: qty 2

## 2021-02-07 NOTE — Progress Notes (Signed)
PROGRESS NOTE    Latasha Anderson  [id number] DOB: [birth year] DOA: 02/01/2021 PCP: Jerrol Banana., MD  Brief Narrative:Latasha Anderson is an 87 y.o. female with medical history significant of A Fib, HTN, HLD, dementia, who presented from her SNF after witnessed fall trying to stand from wheelchair. She is wheelchair bound at baseline with advanced dementia, is oriented to self only, does not answer questions appropriately, can feed self with fingers but doesn't eat much, is otherwise total care at baseline.In ED, Xray showed LEFT hip prosthesis with acute mildly displaced periprosthetic fracture of the proximal LEFT femur   Assessment & Plan:   Left periprosthetic fracture of left femur after mechanical fall -Orthopedic surgery consulted, Dr.Xu discussed options with family  -Underwent ORIF of left posterior femur fracture and revision of STEM 08/3 -she is mostly wheelchair-bound and total care at SNF -Pain control, decrease narcotics to prevent oversedation -lovenox for DVT proph -PT OT eval completed, SNF recommended -Discharge planning -Will need palliative care follow-up at SNF  Acute blood loss anemia -Hemoglobin down to 7.5 this morning, received 2 units of PRBC Intra-Op on 8/3 -Anemia panel with iron deficiency, will give IV iron today -CBC in a.m.  Chronic Foley, urinary retention -Foley catheter changed 8/4  Acute on chronic lower extremity edema -Worsening postop, in the setting of perioperative fluids and blood transfusion  -Repeat Lasix IV Lasix x1 today -Recommend compression stockings at SNF  UTI Leukocytosis, abnormal urinalysis -Completed 5 days of ceftriaxone, urine culture got canceled for unclear reasons, antibiotics discontinued   Chronic A Fib -Hold aspirin -Continue cardizem    HLD -Continue lipitor    Advanced dementia -Continue seroquel   DVT prophylaxis: Lovenox Code Status: DNR Family Communication: Husband at bedside   Disposition Plan:  Status is: Inpatient  Remains inpatient appropriate because:Inpatient level of care appropriate due to severity of illness  Dispo: The patient is from: SNF              Anticipated d/c is to: SNF              Patient currently is not medically stable to d/c.   Difficult to place patient No   Consultants:  Orthopedics  Procedures:   PROCEDURE:  Open reduction internal fixation of left greater trochanter fracture Open reduction internal fixation of left subtrochanteric fracture using separate fixation Open reduction internal fixation of left femoral shaft fracture using separate fixation Removal of left hip hemiarthroplasty Revision left hip hemiarthroplasty Application of incisional wound VAC  Antimicrobials:    Subjective: -More awake today, ate a few bites for breakfast  Objective: Vitals:   02/06/21 0742 02/06/21 1555 02/06/21 2011 02/07/21 0802  BP: 115/60 (!) 101/55 111/60 118/63  Pulse: 91 71 91 90  Resp: '18 15 16 16  '$ Temp: 98.4 F (36.9 C) 98.3 F (36.8 C) 99.4 F (37.4 C) 97.8 F (36.6 C)  TempSrc: Oral Axillary Oral Oral  SpO2: 96% 99% 94% 97%  Weight:      Height:        Intake/Output Summary (Last 24 hours) at 02/07/2021 1134 Last data filed at 02/07/2021 [id number] Gross per 24 hour  Intake 250 ml  Output 670 ml  Net -420 ml   Filed Weights   02/01/21 1448  Weight: 65 kg    Examination:  General exam: Chronically ill elderly female, laying in bed, more awake today, answers few questions, oriented to self only CVS: S1-S2, regular rate rhythm Lungs: Decreased breath sounds  to bases Abdomen: Soft, nontender, bowel sounds present,  extremities: Left hip/thigh with dressing, site appears unremarkable, 2+ edema at both ankles/feet  Skin: No rash on exposed skin Psych: Advanced dementia   Data Reviewed:   CBC: Recent Labs  Lab 02/01/21 1509 02/02/21 0155 02/03/21 0148 02/03/21 1510 02/03/21 1708 02/04/21 0344 02/05/21 0248  02/06/21 0218 02/07/21 0106  WBC 17.1*   < > 10.5  --   --  13.7* 13.1* 13.3* 11.7*  NEUTROABS 14.3*  --   --   --   --   --   --   --   --   HGB 14.5   < > 9.0*   < > 8.5* 9.6* 8.1* 8.1* 7.5*  HCT 45.4   < > 27.5*   < > 25.0* 28.7* 24.1* 24.5* 23.6*  MCV 93.8   < > 91.4  --   --  91.7 92.0 93.9 95.5  PLT 388   < > 232  --   --  185 194 227 267   < > = values in this interval not displayed.   Basic Metabolic Panel: Recent Labs  Lab 02/03/21 0148 02/03/21 1510 02/03/21 1617 02/03/21 1708 02/04/21 0344 02/05/21 0248 02/06/21 0218 02/07/21 0106  NA 134*   < > 136 137 133* 134* 133* 132*  K 3.5   < > 4.5 4.1 4.1 3.8 4.1 4.3  CL 99  --  99  --  97* 102 102 98  CO2 28  --   --   --  '27 28 25 27  '$ GLUCOSE 126*  --  143*  --  144* 165* 133* 101*  BUN 17  --  17  --  '17 19 19 19  '$ CREATININE 0.41*  --  0.30*  --  0.56 0.45 0.40* 0.40*  CALCIUM 8.5*  --   --   --  8.1* 8.3* 8.5* 8.2*   < > = values in this interval not displayed.   GFR: Estimated Creatinine Clearance: 45.5 mL/min (A) (by C-G formula based on SCr of 0.4 mg/dL (L)). Liver Function Tests: Recent Labs  Lab 02/01/21 1509  AST 34  ALT 12  ALKPHOS 77  BILITOT 0.5  PROT 6.5  ALBUMIN 3.4*   No results for input(s): LIPASE, AMYLASE in the last 168 hours. No results for input(s): AMMONIA in the last 168 hours. Coagulation Profile: No results for input(s): INR, PROTIME in the last 168 hours. Cardiac Enzymes: No results for input(s): CKTOTAL, CKMB, CKMBINDEX, TROPONINI in the last 168 hours. BNP (last 3 results) No results for input(s): PROBNP in the last 8760 hours. HbA1C: No results for input(s): HGBA1C in the last 72 hours. CBG: No results for input(s): GLUCAP in the last 168 hours. Lipid Profile: No results for input(s): CHOL, HDL, LDLCALC, TRIG, CHOLHDL, LDLDIRECT in the last 72 hours. Thyroid Function Tests: No results for input(s): TSH, T4TOTAL, FREET4, T3FREE, THYROIDAB in the last 72 hours. Anemia  Panel: Recent Labs    02/07/21 0750  VITAMINB12 531  FOLATE 13.6  FERRITIN 182  TIBC 183*  IRON 14*  RETICCTPCT 5.9*   Urine analysis:    Component Value Date/Time   COLORURINE AMBER (A) 02/01/2021 1631   APPEARANCEUR CLOUDY (A) 02/01/2021 1631   LABSPEC 1.016 02/01/2021 1631   PHURINE 6.0 02/01/2021 1631   GLUCOSEU NEGATIVE 02/01/2021 1631   HGBUR LARGE (A) 02/01/2021 1631   BILIRUBINUR NEGATIVE 02/01/2021 1631   KETONESUR NEGATIVE 02/01/2021 1631   PROTEINUR 100 (A) 02/01/2021  [place] (A) 02/01/2021 1631   LEUKOCYTESUR LARGE (A) 02/01/2021 1631   Sepsis Labs: '@LABRCNTIP'$ (procalcitonin:4,lacticidven:4)  ) Recent Results (from the past 240 hour(s))  Resp Panel by RT-PCR (Flu A&B, Covid) Nasopharyngeal Swab     Status: None   Collection Time: 02/01/21  3:09 PM   Specimen: Nasopharyngeal Swab; Nasopharyngeal(NP) swabs in vial transport medium  Result Value Ref Range Status   SARS Coronavirus 2 by RT PCR NEGATIVE NEGATIVE Final    Comment: (NOTE) SARS-CoV-2 target nucleic acids are NOT DETECTED.  The SARS-CoV-2 RNA is generally detectable in upper respiratory specimens during the acute phase of infection. The lowest concentration of SARS-CoV-2 viral copies this assay can detect is 138 copies/mL. A negative result does not preclude SARS-Cov-2 infection and should not be used as the sole basis for treatment or other patient management decisions. A negative result may occur with  improper specimen collection/handling, submission of specimen other than nasopharyngeal swab, presence of viral mutation(s) within the areas targeted by this assay, and inadequate number of viral copies(<138 copies/mL). A negative result must be combined with clinical observations, patient history, and epidemiological information. The expected result is Negative.  Fact Sheet for Patients:  EntrepreneurPulse.com.au  Fact Sheet for Healthcare Providers:   IncredibleEmployment.be  This test is no t yet approved or cleared by the [place] FDA and  has been authorized for detection and/or diagnosis of SARS-CoV-2 by FDA under an Emergency Use Authorization (EUA). This EUA will remain  in effect (meaning this test can be used) for the duration of the COVID-19 declaration under Section 564(b)(1) of the Act, 21 U.S.C.section 360bbb-3(b)(1), unless the authorization is terminated  or revoked sooner.       Influenza A by PCR NEGATIVE NEGATIVE Final   Influenza B by PCR NEGATIVE NEGATIVE Final    Comment: (NOTE) The Xpert Xpress SARS-CoV-2/FLU/RSV plus assay is intended as an aid in the diagnosis of influenza from Nasopharyngeal swab specimens and should not be used as a sole basis for treatment. Nasal washings and aspirates are unacceptable for Xpert Xpress SARS-CoV-2/FLU/RSV testing.  Fact Sheet for Patients: EntrepreneurPulse.com.au  Fact Sheet for Healthcare Providers: IncredibleEmployment.be  This test is not yet approved or cleared by the [place] FDA and has been authorized for detection and/or diagnosis of SARS-CoV-2 by FDA under an Emergency Use Authorization (EUA). This EUA will remain in effect (meaning this test can be used) for the duration of the COVID-19 declaration under Section 564(b)(1) of the Act, 21 U.S.C. section 360bbb-3(b)(1), unless the authorization is terminated or revoked.  Performed at [facility], [place] [address] [place], [place] [address]   Surgical pcr screen     Status: None   Collection Time: 02/02/21  3:52 PM   Specimen: Nasal Mucosa; Nasal Swab  Result Value Ref Range Status   MRSA, PCR NEGATIVE NEGATIVE Final   Staphylococcus aureus NEGATIVE NEGATIVE Final    Comment: (NOTE) The Xpert SA Assay (FDA approved for NASAL specimens in patients 45 years of age and older), is one component of a  comprehensive surveillance program. It is not intended to diagnose infection nor to guide or monitor treatment. Performed at [facility], [place] [address] [place], [place] [address]     Radiology Studies: No results found.   Scheduled Meds:  atorvastatin  10 mg Oral Daily   Chlorhexidine Gluconate Cloth  6 each Topical Q0600   cholecalciferol  1,000 Units Oral Q1200   diltiazem  60  mg Oral Q12H   docusate sodium  100 mg Oral BID   enoxaparin (LOVENOX) injection  40 mg Subcutaneous Q24H   feeding supplement  237 mL Oral BID BM   ferrous sulfate  325 mg Oral TID PC   latanoprost  1 drop Both Eyes Q1200   multivitamin with minerals  1 tablet Oral Daily   QUEtiapine  25 mg Oral QHS   senna  1 tablet Oral BID   sodium chloride flush  3 mL Intravenous Q12H   timolol  1 drop Both Eyes Q1200   Continuous Infusions:  sodium chloride     ferric gluconate (FERRLECIT) IVPB     methocarbamol (ROBAXIN) IV       LOS: 6 days   Time spent: 64mn  PDomenic Polite MD [facility]   02/07/2021, 11:34 AM

## 2021-02-07 NOTE — Plan of Care (Signed)
  Problem: Clinical Measurements: Goal: Ability to maintain clinical measurements within normal limits will improve Outcome: Progressing   Problem: Education: Goal: Knowledge of General Education information will improve Description: Including pain rating scale, medication(s)/side effects and non-pharmacologic comfort measures Outcome: Progressing   Problem: Health Behavior/Discharge Planning: Goal: Ability to manage health-related needs will improve Outcome: Progressing   Problem: Clinical Measurements: Goal: Ability to maintain clinical measurements within normal limits will improve Outcome: Progressing   Problem: Activity: Goal: Risk for activity intolerance will decrease Outcome: Progressing   Problem: Nutrition: Goal: Adequate nutrition will be maintained Outcome: Progressing   Problem: Coping: Goal: Level of anxiety will decrease Outcome: Progressing   Problem: Pain Managment: Goal: General experience of comfort will improve Outcome: Progressing   Problem: Safety: Goal: Ability to remain free from injury will improve Outcome: Progressing

## 2021-02-08 ENCOUNTER — Inpatient Hospital Stay (HOSPITAL_COMMUNITY): Payer: Medicare PPO

## 2021-02-08 LAB — BASIC METABOLIC PANEL
Anion gap: 6 (ref 5–15)
BUN: 20 mg/dL (ref 8–23)
CO2: 28 mmol/L (ref 22–32)
Calcium: 8.2 mg/dL — ABNORMAL LOW (ref 8.9–10.3)
Chloride: 97 mmol/L — ABNORMAL LOW (ref 98–111)
Creatinine, Ser: 0.46 mg/dL (ref 0.44–1.00)
GFR, Estimated: >60 mL/min (ref >60)
Glucose, Bld: 107 mg/dL — ABNORMAL HIGH (ref 70–99)
Potassium: 3.6 mmol/L (ref 3.5–5.1)
Sodium: 131 mmol/L — ABNORMAL LOW (ref 135–145)

## 2021-02-08 LAB — CBC
HCT: 22.8 % — ABNORMAL LOW (ref 36.0–46.0)
Hemoglobin: 7.3 g/dL — ABNORMAL LOW (ref 12.0–15.0)
MCH: 30.3 pg (ref 26.0–34.0)
MCHC: 32 g/dL (ref 30.0–36.0)
MCV: 94.6 fL (ref 80.0–100.0)
Platelets: 313 10*3/uL (ref 150–400)
RBC: 2.41 MIL/uL — ABNORMAL LOW (ref 3.87–5.11)
RDW: 15.1 % (ref 11.5–15.5)
WBC: 9.4 10*3/uL (ref 4.0–10.5)
nRBC: 0 % (ref 0.0–0.2)

## 2021-02-08 LAB — PREPARE RBC (CROSSMATCH)

## 2021-02-08 MED ORDER — FUROSEMIDE 10 MG/ML IJ SOLN
20.0000 mg | Freq: Once | INTRAMUSCULAR | Status: AC
  Administered 2021-02-08: 20 mg via INTRAVENOUS
  Filled 2021-02-08: qty 2

## 2021-02-08 MED ORDER — POTASSIUM CHLORIDE 20 MEQ PO PACK
40.0000 meq | PACK | Freq: Once | ORAL | Status: AC
  Administered 2021-02-08: 40 meq via ORAL
  Filled 2021-02-08: qty 2

## 2021-02-08 MED ORDER — SODIUM CHLORIDE 0.9% IV SOLUTION: Freq: Once | INTRAVENOUS | Status: AC

## 2021-02-08 MED ORDER — ENSURE ENLIVE PO LIQD
237.0000 mL | Freq: Three times a day (TID) | ORAL | Status: DC
  Administered 2021-02-09 (×2): 237 mL via ORAL

## 2021-02-08 NOTE — Progress Notes (Signed)
Physical Therapy Treatment Patient Details Name: Latasha Anderson MRN: [id number] DOB: 01-01-1931 Today's Date: 02/08/2021    History of Present Illness 87 yo female presents to [facility] on [facility] s/p fall sustaining L periprosthetic fracture, pt w/c level at baseline. s/p ORIF L greater trochanter fx, subtrochanteric fx, L femoral shaft fx, and revision L hip hemiarthroplasty posterior approach. PMH includes afib, HTN, HLD, dementia, breast cancer, HF, mastectomy, L hemiarthroplasty anterior approach 12/2020.    PT Comments    Pt was seen for initially getting brace removed and beginning ROM to BLE's.  Pt is figeting, and at once began to remove her gown with line for blood transfusion within it.  PT tried to stop her but pt is pulling it off.  Daughter arrived and distracted pt to get gown back on and avert disruption of transfusion.  Follow along with her to increase strength and active use of LE's as well as educating family regarding the hip precautions for pt.  Work on acute PT goals as tolerated by pt.  Follow Up Recommendations  SNF     Equipment Recommendations  None recommended by PT    Recommendations for Other Services       Precautions / Restrictions Precautions Precautions: Posterior Hip;Fall Precaution Booklet Issued: No Precaution Comments: Reviewed posterior hip precautions with family - no hip flexion >90 deg, no hip IR past midline, no hip adduction past midline, no crossing ankles. Hip abd pillow to be donned when in bed Required Braces or Orthoses: Other Brace Other Brace: hip abd pillow Restrictions Weight Bearing Restrictions: Yes RLE Weight Bearing: Weight bearing as tolerated LLE Weight Bearing: Weight bearing as tolerated Other Position/Activity Restrictions: abd cushion to protect L hip    Mobility  Bed Mobility Overal bed mobility: Needs Assistance             General bed mobility comments: pt is getting blood transfusion and will not attempt     Transfers                 General transfer comment: not attempted due to blood transfusion  Ambulation/Gait                 Stairs             Wheelchair Mobility    Modified Rankin (Stroke Patients Only)       Balance                                            Cognition Arousal/Alertness: Awake/alert Behavior During Therapy: Anxious Overall Cognitive Status: History of cognitive impairments - at baseline                                 General Comments: history of dementia with pt grasping things strongly and struggling to safely get her to release them      Exercises General Exercises - Lower Extremity Ankle Circles/Pumps: AAROM;5 reps Heel Slides: AAROM;10 reps Hip ABduction/ADduction: AAROM;10 reps Straight Leg Raises: AAROM;10 reps Hip Flexion/Marching: AAROM;10 reps    General Comments General comments (skin integrity, edema, etc.): pt is actively resisting PT with all LE movement, making it a challenge to do ROM in her limits on BLE's.  Pt is assisted with two persons to get abd cushion back, with one to distract  and one to assist with the brace      Pertinent Vitals/Pain Pain Assessment: Faces Faces Pain Scale: Hurts a little bit Pain Location: L hip Pain Descriptors / Indicators: Discomfort;Guarding Pain Intervention(s): Limited activity within patient's tolerance;Monitored during session;Premedicated before session;Repositioned    Home Living                      Prior Function            PT Goals (current goals can now be found in the care plan section) Acute Rehab PT Goals Patient Stated Goal: to not move LLE Progress towards PT goals: Not progressing toward goals - comment    Frequency    Min 3X/week      PT Plan Current plan remains appropriate    Co-evaluation              AM-PAC PT "6 Clicks" Mobility   Outcome Measure  Help needed turning from your back to  your side while in a flat bed without using bedrails?: Total Help needed moving from lying on your back to sitting on the side of a flat bed without using bedrails?: Total Help needed moving to and from a bed to a chair (including a wheelchair)?: Total Help needed standing up from a chair using your arms (e.g., wheelchair or bedside chair)?: Total Help needed to walk in hospital room?: Total Help needed climbing 3-5 steps with a railing? : Total 6 Click Score: 6    End of Session Equipment Utilized During Treatment: Other (comment) (abd cushion) Activity Tolerance: Patient limited by fatigue;Treatment limited secondary to medical complications (Comment) Patient left: in bed;with call bell/phone within reach;with bed alarm set;with family/visitor present Nurse Communication: Mobility status PT Visit Diagnosis: Other abnormalities of gait and mobility (R26.89);Pain Pain - Right/Left: Left Pain - part of body: Hip;Leg     Time: [id number] PT Time Calculation (min) (ACUTE ONLY): 28 min  Charges:  $Therapeutic Exercise: 8-22 mins $Therapeutic Activity: 8-22 mins              Ramond Dial 02/08/2021, 3:58 PM  Mee Hives, PT MS Acute Rehab Dept. Number: [place] and [place]

## 2021-02-08 NOTE — Progress Notes (Signed)
PROGRESS NOTE    Latasha Anderson  [id number] DOB: 1931-03-07 DOA: 02/01/2021 PCP: Jerrol Banana., MD  Brief Narrative:Latasha Anderson is an 87 y.o. female with medical history significant of A Fib, HTN, HLD, dementia, who presented from her SNF after witnessed fall trying to stand from wheelchair. She is wheelchair bound at baseline with advanced dementia, is oriented to self only, mostly total care at baseline. In ED, Xray showed LEFT hip prosthesis with acute mildly displaced periprosthetic fracture of the proximal LEFT femur. -After much discussion with orthopedics, patient's family decided to proceed with surgery, underwent ORIF of posterior femur fracture and revision of STEM -Postop course complicated by confusion, worsening anemia   Assessment & Plan:   Left periprosthetic fracture of left femur after mechanical fall -Orthopedic surgery consulted, Dr.Xu discussed options with family  -Underwent ORIF of left posterior femur fracture and revision of STEM 08/3 -she is mostly wheelchair-bound and total care at SNF -Pain control, decrease narcotics to prevent oversedation -lovenox for DVT proph -PT OT eval completed, SNF recommended -Discharge planning -Will need palliative care follow-up at SNF  Acute blood loss anemia -Hemoglobin down to 7.3 this morning, received 2 units of PRBC Intra-Op on 8/3 -Anemia panel with iron deficiency, given IV iron yesterday, will transfuse 1 unit of PRBC, hold Lovenox  Chronic Foley, urinary retention -Foley catheter changed 8/4  Acute on chronic lower extremity edema -Worsening postop, in the setting of perioperative fluids and blood transfusion  -Will give Lasix after blood transfusion -Recommend compression stockings at SNF  UTI Leukocytosis, abnormal urinalysis -Completed 5 days of ceftriaxone, urine culture got canceled for unclear reasons, antibiotics discontinued   Chronic A Fib -Hold aspirin -Continue cardizem     HLD -Continue lipitor    Advanced dementia -Continue seroquel  -More awake yesterday and today  DVT prophylaxis: Will hold Lovenox today Code Status: DNR Family Communication: Husband at bedside  Disposition Plan:  Status is: Inpatient  Remains inpatient appropriate because:Inpatient level of care appropriate due to severity of illness  Dispo: The patient is from: SNF              Anticipated d/c is to: SNF              Patient currently is not medically stable to d/c.   Difficult to place patient No   Consultants:  Orthopedics  Procedures:   PROCEDURE:  Open reduction internal fixation of left greater trochanter fracture Open reduction internal fixation of left subtrochanteric fracture using separate fixation Open reduction internal fixation of left femoral shaft fracture using separate fixation Removal of left hip hemiarthroplasty Revision left hip hemiarthroplasty Application of incisional wound VAC  Antimicrobials:    Subjective: -More awake today, ate a few bites for breakfast  Objective: Vitals:   02/07/21 0802 02/07/21 1452 02/07/21 2049 02/08/21 0743  BP: 118/63 111/74 109/65 114/60  Pulse: 90 92 72 75  Resp: '16 19 16 14  '$ Temp: 97.8 F (36.6 C) 97.6 F (36.4 C) 98.1 F (36.7 C) 98.1 F (36.7 C)  TempSrc: Oral Oral Oral Oral  SpO2: 97% 97% 98%   Weight:      Height:        Intake/Output Summary (Last 24 hours) at 02/08/2021 1208 Last data filed at 02/08/2021 0900 Gross per 24 hour  Intake 510 ml  Output 2320 ml  Net -1810 ml   Filed Weights   02/01/21 1448  Weight: 65 kg    Examination:  General exam:  Chronically ill elderly female, laying in bed, more awake today, answers few questions, oriented to self only CVS: S1-S2, regular rate rhythm Lungs: Decreased breath sounds to bases Abdomen: Soft, nontender, bowel sounds present,  extremities: Left hip/thigh with dressing, site appears unremarkable, 2+ edema at both ankles/feet  Skin: No  rash on exposed skin Psych: Advanced dementia   Data Reviewed:   CBC: Recent Labs  Lab 02/01/21 1509 02/02/21 0155 02/04/21 0344 02/05/21 0248 02/06/21 0218 02/07/21 0106 02/08/21 0130  WBC 17.1*   < > 13.7* 13.1* 13.3* 11.7* 9.4  NEUTROABS 14.3*  --   --   --   --   --   --   HGB 14.5   < > 9.6* 8.1* 8.1* 7.5* 7.3*  HCT 45.4   < > 28.7* 24.1* 24.5* 23.6* 22.8*  MCV 93.8   < > 91.7 92.0 93.9 95.5 94.6  PLT 388   < > 185 194 227 267 313   < > = values in this interval not displayed.   Basic Metabolic Panel: Recent Labs  Lab 02/04/21 0344 02/05/21 0248 02/06/21 0218 02/07/21 0106 02/08/21 0130  NA 133* 134* 133* 132* 131*  K 4.1 3.8 4.1 4.3 3.6  CL 97* 102 102 98 97*  CO2 '27 28 25 27 28  '$ GLUCOSE 144* 165* 133* 101* 107*  BUN '17 19 19 19 20  '$ CREATININE 0.56 0.45 0.40* 0.40* 0.46  CALCIUM 8.1* 8.3* 8.5* 8.2* 8.2*   GFR: Estimated Creatinine Clearance: 45.5 mL/min (by C-G formula based on SCr of 0.46 mg/dL). Liver Function Tests: Recent Labs  Lab 02/01/21 1509  AST 34  ALT 12  ALKPHOS 77  BILITOT 0.5  PROT 6.5  ALBUMIN 3.4*   No results for input(s): LIPASE, AMYLASE in the last 168 hours. No results for input(s): AMMONIA in the last 168 hours. Coagulation Profile: No results for input(s): INR, PROTIME in the last 168 hours. Cardiac Enzymes: No results for input(s): CKTOTAL, CKMB, CKMBINDEX, TROPONINI in the last 168 hours. BNP (last 3 results) No results for input(s): PROBNP in the last 8760 hours. HbA1C: No results for input(s): HGBA1C in the last 72 hours. CBG: No results for input(s): GLUCAP in the last 168 hours. Lipid Profile: No results for input(s): CHOL, HDL, LDLCALC, TRIG, CHOLHDL, LDLDIRECT in the last 72 hours. Thyroid Function Tests: No results for input(s): TSH, T4TOTAL, FREET4, T3FREE, THYROIDAB in the last 72 hours. Anemia Panel: Recent Labs    02/07/21 0750  VITAMINB12 531  FOLATE 13.6  FERRITIN 182  TIBC 183*  IRON 14*   RETICCTPCT 5.9*   Urine analysis:    Component Value Date/Time   COLORURINE AMBER (A) 02/01/2021 1631   APPEARANCEUR CLOUDY (A) 02/01/2021 1631   LABSPEC 1.016 02/01/2021 1631   PHURINE 6.0 02/01/2021 1631   GLUCOSEU NEGATIVE 02/01/2021 1631   HGBUR LARGE (A) 02/01/2021 1631   BILIRUBINUR NEGATIVE 02/01/2021 1631   KETONESUR NEGATIVE 02/01/2021 1631   PROTEINUR 100 (A) 02/01/2021 1631   NITRITE POSITIVE (A) 02/01/2021 1631   LEUKOCYTESUR LARGE (A) 02/01/2021 1631   Sepsis Labs: '@LABRCNTIP'$ (procalcitonin:4,lacticidven:4)  ) Recent Results (from the past 240 hour(s))  Resp Panel by RT-PCR (Flu A&B, Covid) Nasopharyngeal Swab     Status: None   Collection Time: 02/01/21  3:09 PM   Specimen: Nasopharyngeal Swab; Nasopharyngeal(NP) swabs in vial transport medium  Result Value Ref Range Status   SARS Coronavirus 2 by RT PCR NEGATIVE NEGATIVE Final    Comment: (NOTE) SARS-CoV-2 target nucleic  acids are NOT DETECTED.  The SARS-CoV-2 RNA is generally detectable in upper respiratory specimens during the acute phase of infection. The lowest concentration of SARS-CoV-2 viral copies this assay can detect is 138 copies/mL. A negative result does not preclude SARS-Cov-2 infection and should not be used as the sole basis for treatment or other patient management decisions. A negative result may occur with  improper specimen collection/handling, submission of specimen other than nasopharyngeal swab, presence of viral mutation(s) within the areas targeted by this assay, and inadequate number of viral copies(<138 copies/mL). A negative result must be combined with clinical observations, patient history, and epidemiological information. The expected result is Negative.  Fact Sheet for Patients:  EntrepreneurPulse.com.au  Fact Sheet for Healthcare Providers:  IncredibleEmployment.be  This test is no t yet approved or cleared by the [place] FDA and   has been authorized for detection and/or diagnosis of SARS-CoV-2 by FDA under an Emergency Use Authorization (EUA). This EUA will remain  in effect (meaning this test can be used) for the duration of the COVID-19 declaration under Section 564(b)(1) of the Act, 21 U.S.C.section 360bbb-3(b)(1), unless the authorization is terminated  or revoked sooner.       Influenza A by PCR NEGATIVE NEGATIVE Final   Influenza B by PCR NEGATIVE NEGATIVE Final    Comment: (NOTE) The Xpert Xpress SARS-CoV-2/FLU/RSV plus assay is intended as an aid in the diagnosis of influenza from Nasopharyngeal swab specimens and should not be used as a sole basis for treatment. Nasal washings and aspirates are unacceptable for Xpert Xpress SARS-CoV-2/FLU/RSV testing.  Fact Sheet for Patients: EntrepreneurPulse.com.au  Fact Sheet for Healthcare Providers: IncredibleEmployment.be  This test is not yet approved or cleared by the [place] FDA and has been authorized for detection and/or diagnosis of SARS-CoV-2 by FDA under an Emergency Use Authorization (EUA). This EUA will remain in effect (meaning this test can be used) for the duration of the COVID-19 declaration under Section 564(b)(1) of the Act, 21 U.S.C. section 360bbb-3(b)(1), unless the authorization is terminated or revoked.  Performed at [facility], [place] [address] [place], [place] [address]   Surgical pcr screen     Status: None   Collection Time: 02/02/21  3:52 PM   Specimen: Nasal Mucosa; Nasal Swab  Result Value Ref Range Status   MRSA, PCR NEGATIVE NEGATIVE Final   Staphylococcus aureus NEGATIVE NEGATIVE Final    Comment: (NOTE) The Xpert SA Assay (FDA approved for NASAL specimens in patients 19 years of age and older), is one component of a comprehensive surveillance program. It is not intended to diagnose infection nor to guide or monitor treatment. Performed at [facility], [place] [address], [place], [place] [address]     Radiology Studies: DG HIP UNILAT WITH PELVIS 2-3 VIEWS LEFT  Result Date: 02/08/2021 CLINICAL DATA:  Status post open reduction and internal fixation of left hip. EXAM: DG HIP (WITH OR WITHOUT PELVIS) 2-3V LEFT COMPARISON:  February 03, 2021. FINDINGS: The left acetabular and femoral components appear to be well situated. Postsurgical changes are seen in the surrounding soft tissues. Status post cerclage wire placement for treatment of proximal left femoral fracture. IMPRESSION: Postsurgical changes as described above. Electronically Signed   By: Marijo Conception M.D.   On: 02/08/2021 09:22     Scheduled Meds:  sodium chloride   Intravenous Once   atorvastatin  10 mg Oral Daily   Chlorhexidine Gluconate Cloth  6 each Topical Q0600  cholecalciferol  1,000 Units Oral Q1200   diltiazem  60 mg Oral Q12H   docusate sodium  100 mg Oral BID   enoxaparin (LOVENOX) injection  40 mg Subcutaneous Q24H   feeding supplement  237 mL Oral BID BM   ferrous sulfate  325 mg Oral TID PC   latanoprost  1 drop Both Eyes Q1200   multivitamin with minerals  1 tablet Oral Daily   QUEtiapine  25 mg Oral QHS   senna  1 tablet Oral BID   sodium chloride flush  3 mL Intravenous Q12H   timolol  1 drop Both Eyes Q1200   Continuous Infusions:  sodium chloride     ferric gluconate (FERRLECIT) IVPB Stopped (02/07/21 1406)   methocarbamol (ROBAXIN) IV       LOS: 7 days   Time spent: 33mn  PDomenic Polite MD [facility]   02/08/2021, 12:08 PM

## 2021-02-08 NOTE — TOC Progression Note (Signed)
Transition of Care [facility]) - Progression Note    Patient Details  Name: Latasha Anderson MRN: [id number] Date of Birth: 18-Apr-1931  [facility] of [facility] CM/SW Contact  Milinda Antis, [place] Phone Number: 02/08/2021, 3:07 PM  Clinical Narrative:     CSW spoke with [facility].  The facility will be able to accept the patient back tomorrow.       Expected Discharge Plan and Services                                                 Social Determinants of Health (SDOH) Interventions    Readmission Risk Interventions No flowsheet data found.

## 2021-02-08 NOTE — Plan of Care (Signed)
  Problem: Health Behavior/Discharge Planning: Goal: Ability to manage health-related needs will improve Outcome: Progressing   Problem: Clinical Measurements: Goal: Ability to maintain clinical measurements within normal limits will improve Outcome: Progressing Goal: Will remain free from infection Outcome: Progressing   

## 2021-02-08 NOTE — Progress Notes (Signed)
Initial Nutrition Assessment  DOCUMENTATION CODES:  Severe malnutrition in context of chronic illness  INTERVENTION:  Discontinue Ensure Surgery BID.  Add Ensure Enlive po TID, each supplement provides 350 kcal and 20 grams of protein.  Add Magic cup TID with meals, each supplement provides 290 kcal and 9 grams of protein.  Recommend feeding assistance at meals.  Continue MVI with minerals daily.  NUTRITION DIAGNOSIS:  Severe Malnutrition related to chronic illness (dementia) as evidenced by severe fat depletion, severe muscle depletion.  GOAL:  Patient will meet greater than or equal to 90% of their needs  MONITOR:  PO intake, Supplement acceptance, Labs, Weight trends, I & O's  REASON FOR ASSESSMENT:  Malnutrition Screening Tool    ASSESSMENT:  87 yo female with a PMH of A-fib, HTN, HLD, and dementia who presents with a L periprosthetic fracture of L femur after mechanical fall. 8/3 - total hip revision  Visited pt at bedside. Pt non-responsive to touch or to voice. Per Epic, pt's meal intake is rather poor with 0-50% intake, mostly 0-20%.  Weight has increased the last few admissions. This is likely due to edema. This makes it difficult to confirm weight loss.  On exam, pt with mostly severe depletions.  Recommend liberalizing diet to regular, as pt has no history of diabetes. Communicated this with MD via secure chat.  Medications: reviewed; Vitamin D3, colace BID, Ensure Surgery BID, ferrous sulfate TID, Lasix, MVI with minerals daily, Klor-Con 40 mEq, Senokot BID  Labs: reviewed; Na 131 (L), Glucose 107 (H)  NUTRITION - FOCUSED PHYSICAL EXAM: Flowsheet Row Most Recent Value  Orbital Region Severe depletion  Upper Arm Region Moderate depletion  Thoracic and Lumbar Region Moderate depletion  Buccal Region Severe depletion  Temple Region Severe depletion  Clavicle Bone Region Severe depletion  Clavicle and Acromion Bone Region Severe depletion  Scapular Bone  Region Severe depletion  Dorsal Hand Severe depletion  Patellar Region Severe depletion  Anterior Thigh Region Severe depletion  Posterior Calf Region Severe depletion  Edema (RD Assessment) Moderate  [Generalized, BLE]  Hair Reviewed  Eyes Unable to assess  Mouth Unable to assess  Skin Reviewed  Nails Reviewed   Diet Order:   Diet Order             Diet regular Room service appropriate? Yes; Fluid consistency: Thin  Diet effective now                  EDUCATION NEEDS:  Not appropriate for education at this time  Skin:  Skin Assessment: Skin Integrity Issues: Skin Integrity Issues:: Incisions Incisions: L hip, closed  Last BM:  PTA - OBR in place  Height:  Ht Readings from Last 1 Encounters:  02/01/21 '5\' 6"'$  (1.676 m)   Weight:  Wt Readings from Last 1 Encounters:  02/01/21 65 kg   BMI:  Body mass index is 23.13 kg/m.  Estimated Nutritional Needs:  Kcal:  1650-1850 Protein:  80-95 grams Fluid:  >1.65 L  Derrel Nip, RD, LDN (she/her/hers) Registered Dietitian I After-Hours/Weekend Pager # in [place]

## 2021-02-09 DIAGNOSIS — E43 Unspecified severe protein-calorie malnutrition: Secondary | ICD-10-CM | POA: Insufficient documentation

## 2021-02-09 LAB — TYPE AND SCREEN
ABO/RH(D): A POS
Antibody Screen: NEGATIVE
Unit division: 0

## 2021-02-09 LAB — BPAM RBC
Blood Product Expiration Date: 202209032359
ISSUE DATE / TIME: 202208081249
Unit Type and Rh: 6200

## 2021-02-09 LAB — CBC
HCT: 27.1 % — ABNORMAL LOW (ref 36.0–46.0)
Hemoglobin: 9.1 g/dL — ABNORMAL LOW (ref 12.0–15.0)
MCH: 30.7 pg (ref 26.0–34.0)
MCHC: 33.6 g/dL (ref 30.0–36.0)
MCV: 91.6 fL (ref 80.0–100.0)
Platelets: 375 10*3/uL (ref 150–400)
RBC: 2.96 MIL/uL — ABNORMAL LOW (ref 3.87–5.11)
RDW: 15.9 % — ABNORMAL HIGH (ref 11.5–15.5)
WBC: 10.1 10*3/uL (ref 4.0–10.5)
nRBC: 0 % (ref 0.0–0.2)

## 2021-02-09 LAB — BASIC METABOLIC PANEL
Anion gap: 6 (ref 5–15)
BUN: 14 mg/dL (ref 8–23)
CO2: 29 mmol/L (ref 22–32)
Calcium: 8.3 mg/dL — ABNORMAL LOW (ref 8.9–10.3)
Chloride: 97 mmol/L — ABNORMAL LOW (ref 98–111)
Creatinine, Ser: 0.39 mg/dL — ABNORMAL LOW (ref 0.44–1.00)
GFR, Estimated: >60 mL/min (ref >60)
Glucose, Bld: 100 mg/dL — ABNORMAL HIGH (ref 70–99)
Potassium: 3.3 mmol/L — ABNORMAL LOW (ref 3.5–5.1)
Sodium: 132 mmol/L — ABNORMAL LOW (ref 135–145)

## 2021-02-09 LAB — RESP PANEL BY RT-PCR (FLU A&B, COVID) ARPGX2
Influenza A by PCR: NEGATIVE
Influenza B by PCR: NEGATIVE
SARS Coronavirus 2 by RT PCR: NEGATIVE

## 2021-02-09 MED ORDER — HYDROCODONE-ACETAMINOPHEN 5-325 MG PO TABS: 1.0000 | ORAL_TABLET | Freq: Four times a day (QID) | ORAL | 0 refills | Status: AC | PRN

## 2021-02-09 NOTE — Discharge Summary (Signed)
Physician Discharge Summary  [place] [id number] DOB: Jan 13, 1931 DOA: 02/01/2021  PCP: Jerrol Banana., MD  Admit date: 02/01/2021 Discharge date: 02/09/2021  Time spent: 35 minutes  Recommendations for Outpatient Follow-up:  PCP in 1 week Orthopedics Dr. Erlinda Hong in 1 week, continue Lovenox for 4 weeks for DVT prophylaxis Palliative care follow-up at SNF, continue discussions regarding goals of care and hospice if she continues to decline   Discharge Diagnoses:  Principal Problem:   Periprosthetic fracture around internal prosthetic left hip joint ([place]) Advanced dementia Acute blood loss anemia Chronic Foley, urinary retention   HLD (hyperlipidemia)   Chronic atrial fibrillation ([place])   Dementia ([place])   DNR (do not resuscitate)   Protein-calorie malnutrition, severe Hypoalbuminemia Chronic lower extremity edema UTI Chronic A. Fib DO NOT RESUSCITATE  Discharge Condition: Stable  Diet recommendation: Low-sodium  Filed Weights   02/01/21 1448  Weight: 65 kg    History of present illness:  Staphanie Anderson is an 87 y.o. female with medical history significant of A Fib, HTN, HLD, dementia, who presented from her SNF after witnessed fall trying to stand from wheelchair. She is wheelchair bound at baseline with advanced dementia, is oriented to self only, mostly total care at baseline. In ED, Xray showed LEFT hip prosthesis with acute mildly displaced periprosthetic fracture of the proximal LEFT femur  Hospital Course:   Left periprosthetic fracture of left femur after mechanical fall -Orthopedic surgery consulted, Dr.Xu discussed options with family -Underwent ORIF of left posterior femur fracture and revision of STEM 08/3 -she is mostly wheelchair-bound has advanced dementia and total care at SNF -Pain controlled -Continue Lovenox for 4 weeks for DVT prophylaxis -Ortho recommended weightbearing as tolerated bilateral lower extremity, recommended to continue  wound VAC at discharge, follow-up with orthopedics Dr.Xu in 1 week in the clinic   Acute blood loss anemia -Hemoglobin down to yesterday, received 2 units of PRBC Intra-Op on 8/3 -Anemia panel with iron deficiency, transfuse 1 unit of PRBC and received IV iron -Hemoglobin stable and improved now  Advanced dementia -Continue seroquel  -Total care and bedbound at SNF -Overall prognosis is relatively poor, recommend palliative care follow-up at SNF, discussed consideration of hospice with patient's spouse if she continues to decline   Chronic Foley, urinary retention -Foley catheter changed 8/4 -Continue routine catheter care and change Foley every 30 days   Acute on chronic lower extremity edema Likely third spacing from hypoalbuminemia -Worsening postop, in the setting of perioperative fluids and blood transfusion -Given IV Lasix postop, continue oral Lasix, recommend compression stockings   UTI Leukocytosis, abnormal urinalysis -Completed 5 days of ceftriaxone, urine culture got canceled for unclear reasons, antibiotics discontinued   Chronic A Fib -Aspirin resumed -Continue cardizem    HLD -Continue lipitor      Discharge Exam: Vitals:   02/08/21 2055 02/09/21 0733  BP: 123/67 116/70  Pulse: 90 93  Resp: 18   Temp: 98 F (36.7 C) 98.3 F (36.8 C)  SpO2: 99%     General: Awake alert, oriented to self only, pleasantly confused, advanced dementia Cardiovascular: S1-S2, regular rate rhythm Respiratory: Decreased breath sounds to bases  Discharge Instructions    Allergies as of 02/09/2021       Reactions   Ciprofloxacin Nausea And Vomiting   Cortisone Other (See Comments)   Pt stated this makes her turn blue   Nitrofurantoin Monohyd Macro Other (See Comments)   GI upset & chills.   Other Other (See Comments)  Mycins - unknown reaction        Medication List     STOP taking these medications    docusate sodium 100 MG capsule Commonly known as:  COLACE       TAKE these medications    acetaminophen 500 MG tablet Commonly known as: TYLENOL Take 500 mg by mouth every 8 (eight) hours as needed (pain).   cholecalciferol 25 MCG (1000 UNIT) tablet Commonly known as: VITAMIN D3 Take 1,000 Units by mouth daily with lunch.   diltiazem 60 MG 12 hr capsule Commonly known as: CARDIZEM SR Take 60 mg by mouth every 12 (twelve) hours. Hold if BP <110/70   enoxaparin 40 MG/0.4ML injection Commonly known as: LOVENOX Inject 0.4 mLs (40 mg total) into the skin daily for 14 days.   feeding supplement Liqd Take 237 mLs by mouth 2 (two) times daily between meals.   furosemide 20 MG tablet Commonly known as: LASIX Take 20 mg by mouth every evening.   HYDROcodone-acetaminophen 5-325 MG tablet Commonly known as: NORCO/VICODIN Take 1 tablet by mouth every 6 (six) hours as needed for severe pain (pain score 4-6).   latanoprost 0.005 % ophthalmic solution Commonly known as: XALATAN Place 1 drop into both eyes daily with lunch.   methocarbamol 500 MG tablet Commonly known as: ROBAXIN Take 1 tablet (500 mg total) by mouth every 6 (six) hours as needed for muscle spasms.   polyethylene glycol 17 g packet Commonly known as: MIRALAX / GLYCOLAX Take 17 g by mouth daily as needed for mild constipation.   potassium chloride SA 20 MEQ tablet Commonly known as: KLOR-CON Take 20 mEq by mouth daily with lunch.   QUEtiapine 25 MG tablet Commonly known as: SEROQUEL Take 1 tablet (25 mg total) by mouth at bedtime.   senna 8.6 MG Tabs tablet Commonly known as: SENOKOT Take 1 tablet (8.6 mg total) by mouth 2 (two) times daily.   timolol 0.5 % ophthalmic solution Commonly known as: TIMOPTIC Place 1 drop into both eyes daily with lunch.       ASK your doctor about these medications    alendronate 70 MG tablet Commonly known as: FOSAMAX TAKE 1 TABLET (70 MG TOTAL) BY MOUTH EVERY 7 (SEVEN) DAYS. TAKE WITH A FULL GLASS OF WATER ON AN EMPTY  STOMACH.   aspirin 325 MG EC tablet Take 1 tablet (325 mg total) by mouth daily with breakfast.   atorvastatin 10 MG tablet Commonly known as: LIPITOR TAKE 1 TABLET (10 MG TOTAL) BY MOUTH DAILY. PLEASE SCHEDULE AN OFFICE VISIT BEFORE ANYMORE REFILLS.   ferrous sulfate 325 (65 FE) MG tablet Take 1 tablet (325 mg total) by mouth 3 (three) times daily after meals.   multivitamin with minerals Tabs tablet Take 1 tablet by mouth daily.       Allergies  Allergen Reactions   Ciprofloxacin Nausea And Vomiting   Cortisone Other (See Comments)    Pt stated this makes her turn blue   Nitrofurantoin Monohyd Macro Other (See Comments)    GI upset & chills.   Other Other (See Comments)    Mycins - unknown reaction    Follow-up Information     Leandrew Koyanagi, MD Follow up in 2 week(s).   Specialty: Orthopedic Surgery Why: For wound re-check, For suture removal Contact information: [place] [address] [phone]                  The results of significant diagnostics from  this hospitalization (including imaging, microbiology, ancillary and laboratory) are listed below for reference.    Significant Diagnostic Studies: DG Chest 1 View  Result Date: 02/01/2021 CLINICAL DATA:  Witnessed fall EXAM: CHEST  1 VIEW COMPARISON:  12/07/2020 FINDINGS: Capsular calcification at RIGHT breast prosthesis. Upper normal size of cardiac silhouette. Mediastinal contours and pulmonary vascularity normal. Lungs grossly clear. No pleural effusion or pneumothorax. Diffuse osseous demineralization with dextroconvex thoracic scoliosis. Surgical clips RIGHT axilla. IMPRESSION: No scratch acute abnormalities. Electronically Signed   By: Lavonia Dana M.D.   On: 02/01/2021 16:17   CT Head Wo Contrast  Result Date: 02/01/2021 CLINICAL DATA:  Witnessed fall trying to stand from wheelchair, EMS states patient struck head, denies loss of consciousness EXAM: CT HEAD WITHOUT CONTRAST  TECHNIQUE: Contiguous axial images were obtained from the base of the skull through the vertex without intravenous contrast. Sagittal and coronal MPR images reconstructed from axial data set. COMPARISON:  01/09/2021 FINDINGS: Brain: Undo generalized atrophy. Normal ventricular morphology. No midline shift or mass effect. Small vessel chronic ischemic changes of deep cerebral white matter. No intracranial hemorrhage or evidence of acute infarction. Asymmetric prominence of extra-axial fluid at anterior aspect of LEFT middle cranial fossa versus RIGHT, likely represents asymmetric atrophy of the temporal lobes, doubt arachnoid cyst. No acute extra-axial hemorrhage. Vascular: Atherosclerotic calcification at RIGHT vertebral artery. No hyperdense vessels. Skull: Demineralized but intact Sinuses/Orbits: Clear Other: N/A IMPRESSION: Atrophy with small vessel chronic ischemic changes of deep cerebral white matter. No acute intracranial abnormalities. Electronically Signed   By: Lavonia Dana M.D.   On: 02/01/2021 16:23   CT Cervical Spine Wo Contrast  Result Date: 02/01/2021 CLINICAL DATA:  Witnessed fall while trying to stand from wheelchair EXAM: CT CERVICAL SPINE WITHOUT CONTRAST TECHNIQUE: Multidetector CT imaging of the cervical spine was performed without intravenous contrast. Multiplanar CT image reconstructions were also generated. COMPARISON:  01/09/2021 FINDINGS: Alignment: Slight anterolisthesis at C3-C4 and T2-T3 unchanged. Remaining alignments normal. Skull base and vertebrae: Osseous demineralization. Vertebral body heights maintained. Multilevel disc space narrowing and endplate spur formation throughout cervical spine. Multilevel facet degenerative changes. Skull base intact. No fracture, additional subluxation, or bone destruction. Soft tissues and spinal canal: Prevertebral soft tissues normal thickness. Atherosclerotic calcifications at carotid bifurcations bilaterally as well as at RIGHT vertebral  artery. Disc levels:  No specific abnormalities Upper chest: Small BILATERAL pleural effusions. Lung apices otherwise clear. Other: N/A IMPRESSION: Multilevel degenerative disc and facet disease changes of the cervical spine. No acute cervical spine abnormalities. Small BILATERAL pleural effusions. Electronically Signed   By: Lavonia Dana M.D.   On: 02/01/2021 16:29   Pelvis Portable  Result Date: 02/03/2021 CLINICAL DATA:  87 year old female status post ORIF of the left hip. EXAM: PORTABLE PELVIS 1-2 VIEWS COMPARISON:  Left hip CT dated 02/02/2021. FINDINGS: Total left hip arthroplasty with cerclage wires through the greater trochanter and proximal femur. The fracture fragments of the proximal femur appear in near anatomic alignment. The arthroplasty components appear intact and in anatomic alignment. Fracture of the greater trochanter of the left femur with cerclage wires. The bones are osteopenic. There is no dislocation. Degenerative changes of the right hip. Postsurgical changes in the soft tissues of the left hip with cutaneous staples. IMPRESSION: Total left hip arthroplasty. Electronically Signed   By: Anner Crete M.D.   On: 02/03/2021 20:19   CT FEMUR LEFT WO CONTRAST  Result Date: 02/03/2021 CLINICAL DATA:  Periprosthetic fracture of the proximal left femur. EXAM: CT OF THE  LOWER LEFT EXTREMITY WITHOUT CONTRAST TECHNIQUE: Multidetector CT imaging of the left thigh was performed according to the standard protocol. COMPARISON:  Radiographs 02/01/2021 and 12/28/2020 FINDINGS: Bones/Joint/Cartilage Images extend from the lower left pelvis through the left knee. Patient is status post left hip bipolar hemiarthroplasty. The hip remains located, and there is no evidence left pelvic fracture. There is a comminuted and moderately displaced periprosthetic fracture of the proximal left femur. This fracture is associated with 2 primary fracture fragments. An anterior component extending into the greater  trochanter measures up to 12.6 cm in length and demonstrates anterolateral displacement inferiorly by up to 2.0 cm. There is a less displaced component involving the posterolateral aspect of the proximal right femur. This fracture fragment measures 9.8 cm in length and demonstrates 1 cm of posterolateral displacement inferiorly. The fractures extend to the level of the distal end of the femoral prosthesis. The distal femur appears intact. There is a moderate size knee joint effusion without evidence of lipohemarthrosis or acute osseous injury. Ligaments Suboptimally assessed by CT. Muscles and Tendons Large hematoma involving the quadriceps musculature, primarily the vastus intermedius muscle. This hemorrhage extends into the distal thigh. No apparent tendon injury. Soft tissues Moderate soft tissue swelling in the proximal thigh without additional focal fluid collection or unexpected foreign body. IMPRESSION: 1. Comminuted and moderately displaced periprosthetic fracture of the left femur as described. No extension of the fracture beyond the tip of the femoral prosthesis identified. 2. No evidence of dislocation of the left hip hemiarthroplasty or fracture of the left hemipelvis. 3. Prominent hematoma involving the quadriceps musculature. Electronically Signed   By: Richardean Sale M.D.   On: 02/03/2021 08:49   DG C-Arm 1-60 Min-No Report  Result Date: 02/03/2021 Fluoroscopy was utilized by the requesting physician.  No radiographic interpretation.   DG HIP UNILAT WITH PELVIS 2-3 VIEWS LEFT  Result Date: 02/08/2021 CLINICAL DATA:  Status post open reduction and internal fixation of left hip. EXAM: DG HIP (WITH OR WITHOUT PELVIS) 2-3V LEFT COMPARISON:  February 03, 2021. FINDINGS: The left acetabular and femoral components appear to be well situated. Postsurgical changes are seen in the surrounding soft tissues. Status post cerclage wire placement for treatment of proximal left femoral fracture. IMPRESSION:  Postsurgical changes as described above. Electronically Signed   By: Marijo Conception M.D.   On: 02/08/2021 09:22   DG Hip Unilat With Pelvis 2-3 Views Left  Result Date: 02/01/2021 CLINICAL DATA:  Witnessed fall trying to stand from wheelchair, shortening and rotation of LEFT leg EXAM: DG HIP (WITH OR WITHOUT PELVIS) 2-3V LEFT COMPARISON:  None FINDINGS: Osseous demineralization. LEFT hip prosthesis seen. Narrowing of the hip joints bilaterally. SI joints preserved. Pelvis intact. Mildly displaced periprosthetic fracture identified at the proximal LEFT femur without dislocation. IMPRESSION: LEFT hip prosthesis with acute mildly displaced periprosthetic fracture of the proximal LEFT femur. Electronically Signed   By: Lavonia Dana M.D.   On: 02/01/2021 16:16   DG FEMUR MIN 2 VIEWS LEFT  Result Date: 02/01/2021 CLINICAL DATA:  Femur fracture post fall, scheduled for surgery 322 EXAM: LEFT FEMUR 2 VIEWS COMPARISON:  Radiograph 02/01/2021 FINDINGS: Left hip hemiarthroplasty. Dedicated femur images further demonstrate the periprosthetic femur fracture extending from the trochanteric region towards the distal aspect of the femoral stem in the proximal femoral diaphysis. Minimal comminution and lateral displacement of the dominant fracture fragments. No distal femur fracture is identified. Diffuse soft tissue swelling of the hip and proximal thigh laterally. Tricompartmental degenerative changes noted  at the knee, incompletely assessed on this exam. IMPRESSION: Left hip hemiarthroplasty with a periprosthetic left femoral fracture and adjacent soft tissue swelling. Electronically Signed   By: Lovena Le M.D.   On: 02/01/2021 22:23   DG FEMUR PORT MIN 2 VIEWS LEFT  Result Date: 02/03/2021 CLINICAL DATA:  ORIF left femoral fracture EXAM: LEFT FEMUR PORTABLE 2 VIEWS COMPARISON:  02/01/2021 FINDINGS: Frontal and lateral views of the proximal aspect of the left femur are obtained. There has been revision of the femoral  component of the left hip arthroplasty, with a long stem component and multiple cerclage wires identified within the proximal left femur. Previous fracture fragment is in anatomic alignment. Postsurgical changes are seen in the overlying soft tissues. IMPRESSION: 1. Revision of femoral component of the left hip arthroplasty, with multiple cerclage wire surrounding the previous fracture fragment. Alignment is anatomic. Electronically Signed   By: Randa Ngo M.D.   On: 02/03/2021 20:18    Microbiology: Recent Results (from the past 240 hour(s))  Resp Panel by RT-PCR (Flu A&B, Covid) Nasopharyngeal Swab     Status: None   Collection Time: 02/01/21  3:09 PM   Specimen: Nasopharyngeal Swab; Nasopharyngeal(NP) swabs in vial transport medium  Result Value Ref Range Status   SARS Coronavirus 2 by RT PCR NEGATIVE NEGATIVE Final    Comment: (NOTE) SARS-CoV-2 target nucleic acids are NOT DETECTED.  The SARS-CoV-2 RNA is generally detectable in upper respiratory specimens during the acute phase of infection. The lowest concentration of SARS-CoV-2 viral copies this assay can detect is 138 copies/mL. A negative result does not preclude SARS-Cov-2 infection and should not be used as the sole basis for treatment or other patient management decisions. A negative result may occur with  improper specimen collection/handling, submission of specimen other than nasopharyngeal swab, presence of viral mutation(s) within the areas targeted by this assay, and inadequate number of viral copies(<138 copies/mL). A negative result must be combined with clinical observations, patient history, and epidemiological information. The expected result is Negative.  Fact Sheet for Patients:  EntrepreneurPulse.com.au  Fact Sheet for Healthcare Providers:  IncredibleEmployment.be  This test is no t yet approved or cleared by the [place] FDA and  has been authorized for detection  and/or diagnosis of SARS-CoV-2 by FDA under an Emergency Use Authorization (EUA). This EUA will remain  in effect (meaning this test can be used) for the duration of the COVID-19 declaration under Section 564(b)(1) of the Act, 21 U.S.C.section 360bbb-3(b)(1), unless the authorization is terminated  or revoked sooner.       Influenza A by PCR NEGATIVE NEGATIVE Final   Influenza B by PCR NEGATIVE NEGATIVE Final    Comment: (NOTE) The Xpert Xpress SARS-CoV-2/FLU/RSV plus assay is intended as an aid in the diagnosis of influenza from Nasopharyngeal swab specimens and should not be used as a sole basis for treatment. Nasal washings and aspirates are unacceptable for Xpert Xpress SARS-CoV-2/FLU/RSV testing.  Fact Sheet for Patients: EntrepreneurPulse.com.au  Fact Sheet for Healthcare Providers: IncredibleEmployment.be  This test is not yet approved or cleared by the [place] FDA and has been authorized for detection and/or diagnosis of SARS-CoV-2 by FDA under an Emergency Use Authorization (EUA). This EUA will remain in effect (meaning this test can be used) for the duration of the COVID-19 declaration under Section 564(b)(1) of the Act, 21 U.S.C. section 360bbb-3(b)(1), unless the authorization is terminated or revoked.  Performed at [facility], [place] [address], [place], [place] [address]  Surgical pcr screen     Status: None   Collection Time: 02/02/21  3:52 PM   Specimen: Nasal Mucosa; Nasal Swab  Result Value Ref Range Status   MRSA, PCR NEGATIVE NEGATIVE Final   Staphylococcus aureus NEGATIVE NEGATIVE Final    Comment: (NOTE) The Xpert SA Assay (FDA approved for NASAL specimens in patients 80 years of age and older), is one component of a comprehensive surveillance program. It is not intended to diagnose infection nor to guide or monitor treatment. Performed at [facility], [place] [address],  [place], [place] [address]   Resp Panel by RT-PCR (Flu A&B, Covid) Nasopharyngeal Swab     Status: None   Collection Time: 02/09/21  7:39 AM   Specimen: Nasopharyngeal Swab; Nasopharyngeal(NP) swabs in vial transport medium  Result Value Ref Range Status   SARS Coronavirus 2 by RT PCR NEGATIVE NEGATIVE Final    Comment: (NOTE) SARS-CoV-2 target nucleic acids are NOT DETECTED.  The SARS-CoV-2 RNA is generally detectable in upper respiratory specimens during the acute phase of infection. The lowest concentration of SARS-CoV-2 viral copies this assay can detect is 138 copies/mL. A negative result does not preclude SARS-Cov-2 infection and should not be used as the sole basis for treatment or other patient management decisions. A negative result may occur with  improper specimen collection/handling, submission of specimen other than nasopharyngeal swab, presence of viral mutation(s) within the areas targeted by this assay, and inadequate number of viral copies(<138 copies/mL). A negative result must be combined with clinical observations, patient history, and epidemiological information. The expected result is Negative.  Fact Sheet for Patients:  EntrepreneurPulse.com.au  Fact Sheet for Healthcare Providers:  IncredibleEmployment.be  This test is no t yet approved or cleared by the [place] FDA and  has been authorized for detection and/or diagnosis of SARS-CoV-2 by FDA under an Emergency Use Authorization (EUA). This EUA will remain  in effect (meaning this test can be used) for the duration of the COVID-19 declaration under Section 564(b)(1) of the Act, 21 U.S.C.section 360bbb-3(b)(1), unless the authorization is terminated  or revoked sooner.       Influenza A by PCR NEGATIVE NEGATIVE Final   Influenza B by PCR NEGATIVE NEGATIVE Final    Comment: (NOTE) The Xpert Xpress SARS-CoV-2/FLU/RSV plus assay is intended as an aid in the diagnosis of  influenza from Nasopharyngeal swab specimens and should not be used as a sole basis for treatment. Nasal washings and aspirates are unacceptable for Xpert Xpress SARS-CoV-2/FLU/RSV testing.  Fact Sheet for Patients: EntrepreneurPulse.com.au  Fact Sheet for Healthcare Providers: IncredibleEmployment.be  This test is not yet approved or cleared by the [place] FDA and has been authorized for detection and/or diagnosis of SARS-CoV-2 by FDA under an Emergency Use Authorization (EUA). This EUA will remain in effect (meaning this test can be used) for the duration of the COVID-19 declaration under Section 564(b)(1) of the Act, 21 U.S.C. section 360bbb-3(b)(1), unless the authorization is terminated or revoked.  Performed at [facility], [place] [address], [place], [place] [address]      Labs: Basic Metabolic Panel: Recent Labs  Lab 02/05/21 0248 02/06/21 0218 02/07/21 0106 02/08/21 0130 02/09/21 0257  NA 134* 133* 132* 131* 132*  K 3.8 4.1 4.3 3.6 3.3*  CL 102 102 98 97* 97*  CO2 '28 25 27 28 29  '$ GLUCOSE 165* 133* 101* 107* 100*  BUN '19 19 19 20 14  '$ CREATININE 0.45 0.40* 0.40* 0.46 0.39*  CALCIUM 8.3* 8.5* 8.2* 8.2* 8.3*   Liver Function Tests: No results for input(s): AST, ALT, ALKPHOS, BILITOT, PROT, ALBUMIN in the last 168 hours. No results for input(s): LIPASE, AMYLASE in the last 168 hours. No results for input(s): AMMONIA in the last 168 hours. CBC: Recent Labs  Lab 02/05/21 0248 02/06/21 0218 02/07/21 0106 02/08/21 0130 02/09/21 0257  WBC 13.1* 13.3* 11.7* 9.4 10.1  HGB 8.1* 8.1* 7.5* 7.3* 9.1*  HCT 24.1* 24.5* 23.6* 22.8* 27.1*  MCV 92.0 93.9 95.5 94.6 91.6  PLT 194 227 267 313 375   Cardiac Enzymes: No results for input(s): CKTOTAL, CKMB, CKMBINDEX, TROPONINI in the last 168 hours. BNP: BNP (last 3 results) No results for input(s): BNP in the last 8760 hours.  ProBNP (last 3 results) No results for  input(s): PROBNP in the last 8760 hours.  CBG: No results for input(s): GLUCAP in the last 168 hours.     Signed:  Domenic Polite MD.  [facility] 02/09/2021, 9:51 AM

## 2021-02-09 NOTE — Progress Notes (Signed)
OT Cancellation Note  Patient Details Name: Jerzy [place] MRN: [id number] DOB: 04-07-1931   Cancelled Treatment:    Reason Eval/Treat Not Completed:  (Pt with signed discharge, and plans to go to SNF today. OT treatment to f/u should her acute length of stay continue.)  Ravan Schlemmer A Pape Parson 02/09/2021, 4:53 PM

## 2021-02-09 NOTE — TOC Transition Note (Signed)
Transition of Care [facility]) - CM/SW Discharge Note   Patient Details  Name: Latasha Anderson MRN: [id number] Date of Birth: 05/12/1931  Transition of Care [facility]) CM/SW Contact:  Milinda Antis, LCSWA Phone Number: 02/09/2021, 1:10 PM   Clinical Narrative:    Patient will DC to: [facility] Anticipated DC date: 02/09/2021 Family notified: Yes Transport by: Corey Harold   Per MD patient ready for DC to SNF.  CSW met with the patient, patient's spouse, and patient's daughter.  The family had questions about the process for transferring the patient to the facility.  CSW called the facility and spoke with admissions so that the family could receive all of the needed information. RN to call report prior to discharge [phone]. RN, patient, patient's family, and facility notified of DC. Discharge Summary and FL2 sent to facility. DC packet on chart. Ambulance transport will be requested for patient when RN reports that the patient is medically ready.   CSW will sign off for now as social work intervention is no longer needed. Please consult [place] again if new needs arise.     Final next level of care: [place] Barriers to Discharge: Barriers Resolved   Patient Goals and CMS Choice        Discharge Placement              Patient chooses bed at:  [facility])   Name of family member notified: Latasha Anderson, Latasha Anderson (Spouse)   [phone] Patient and family notified of of transfer: 02/09/21  Discharge Plan and Services                                     Social Determinants of Health (SDOH) Interventions     Readmission Risk Interventions No flowsheet data found.

## 2021-02-15 ENCOUNTER — Telehealth: Payer: Self-pay

## 2021-02-15 ENCOUNTER — Other Ambulatory Visit: Payer: Self-pay | Admitting: Cardiovascular Disease

## 2021-02-15 NOTE — Telephone Encounter (Signed)
Latasha Anderson aware.

## 2021-02-15 NOTE — Telephone Encounter (Signed)
The wound vac may be removed when the 7 day battery runs out.  Please then apply a mepilex or aquacel surgical dressing on the incision.  Thanks so much.

## 2021-02-15 NOTE — Telephone Encounter (Signed)
Patient is s/p ORIF OF LEFT POSTERIOR FEMUR FRACTURE & REVISION OF STEM preformed on 02/03/2021. Wound Vac was applied however facility would like to know if this is able to be discontinued. Patient has had no drainage, incision looks fine.   Amy- [phone]

## 2021-02-24 ENCOUNTER — Ambulatory Visit (INDEPENDENT_AMBULATORY_CARE_PROVIDER_SITE_OTHER): Payer: Medicare PPO | Admitting: Orthopaedic Surgery

## 2021-02-24 ENCOUNTER — Encounter: Payer: Self-pay | Admitting: Orthopaedic Surgery

## 2021-02-24 ENCOUNTER — Other Ambulatory Visit: Payer: Self-pay

## 2021-02-24 ENCOUNTER — Ambulatory Visit (INDEPENDENT_AMBULATORY_CARE_PROVIDER_SITE_OTHER): Payer: Medicare PPO

## 2021-02-24 DIAGNOSIS — S72002D Fracture of unspecified part of neck of left femur, subsequent encounter for closed fracture with routine healing: Secondary | ICD-10-CM

## 2021-02-24 NOTE — Progress Notes (Signed)
Post-Op Visit Note   Patient: Latasha Anderson           Date of Birth: 26-Dec-1930           MRN: [id number] Visit Date: 02/24/2021 PCP: Jerrol Banana., MD   Assessment & Plan:  Chief Complaint:  Chief Complaint  Patient presents with   Left Hip - Pain   Visit Diagnoses:  1. Closed fracture of left hip with routine healing, subsequent encounter     Plan: Patient is a pleasant 87 year old female who comes in today 3 weeks status post ORIF left periprosthetic femur fracture with revision of the left hemiarthroplasty, date of surgery 02/03/2021.  She has been doing okay.  She denies any pain.  She is ambulating in a wheelchair.  Currently living at [facility].  Examination of the left hip reveals a well-healing surgical incision with staples intact.  No evidence of infection or cellulitis.  Calf is soft nontender.  She is neurovascular intact distally.  At this point, we will continue posterior hip precautions.  She may stand for transfers only.  Will limit hip abduction for 5 more weeks.  She will follow-up with [place] in 3 weeks time for repeat evaluation and x-rays of the left hip.  Call with concerns or questions in the meantime.    Follow-Up Instructions: Return in about 3 weeks (around 03/17/2021).   Orders:  Orders Placed This Encounter  Procedures   XR HIP UNILAT W OR W/O PELVIS 2-3 VIEWS LEFT   No orders of the defined types were placed in this encounter.   Imaging: XR HIP UNILAT W OR W/O PELVIS 2-3 VIEWS LEFT  Result Date: 02/24/2021 Stable appearance of revision left femoral component with multiple cerclage cables.  Greater trochanter fracture is stable.  Subtrochanteric and femoral shaft fractures are also stable and well aligned.   PMFS History: Patient Active Problem List   Diagnosis Date Noted   Protein-calorie malnutrition, severe 02/09/2021   Periprosthetic fracture around internal prosthetic left hip joint ([place]) 02/01/2021   Dementia ([place]) 02/01/2021    DNR (do not resuscitate) 02/01/2021   Hip fracture ([place]) 12/06/2020   Generalized weakness 11/27/2020   Low back pain 11/27/2020   Frequent falls 11/27/2020   (HFpEF) heart failure with preserved ejection fraction (HCC)    Chronic atrial fibrillation ([place]) 05/01/2015   Atrophic vaginitis 12/15/2014   Breast CA ([place]) 12/15/2014   Decrease in the ability to hear 12/15/2014   Essential (primary) hypertension 12/15/2014   Fatigue 12/15/2014   Acid reflux 12/15/2014   Glaucoma 12/15/2014   Flu vaccine need 12/15/2014   Blood glucose elevated 12/15/2014   Arthritis, degenerative 12/15/2014   OP (osteoporosis) 12/15/2014   HLD (hyperlipidemia) 12/15/2014   Pain of upper abdomen 12/15/2014   Encounter for therapeutic drug monitoring 09/04/2013   Preop cardiovascular exam 03/07/2013   Hypokalemia    Long term (current) use of anticoagulants 08/22/2012   Hypertension    Breast cancer ([place])    Shortness of breath    Chest pain    Ejection fraction    Mitral regurgitation    SHINGLES 07/26/2010   Past Medical History:  Diagnosis Date   (HFpEF) heart failure with preserved ejection fraction ([place])    a. EF 55-65%, January, 2012, question diastolic dysfunction; b. [id number] Echo: EF 55-60%, no rwma. Mild MR. Mildly dil LA. Nl RV fxn. PASP 41mHg.   Breast cancer ([place] 1985   RT MASTECTOMY   Chest pain  Dyslipidemia    Hypertension    Hypokalemia    February, 2014, potassium started   Mitral regurgitation    Mild, echo, 2012   Permanent atrial fibrillation ([place])    a. Dx 08/2012. CHA2DSD2VASc = 4-5-->warfarin.   Shingles    Shortness of breath    January, 0000000, normal systolic function, question diastolic dysfunction    Family History  Problem Relation Age of Onset   Stroke Mother    Diabetes Father    Cancer Brother        prostate   Breast cancer Neg Hx     Past Surgical History:  Procedure Laterality Date   ABDOMINAL HYSTERECTOMY     age 56   ANTERIOR APPROACH HEMI  HIP ARTHROPLASTY Left 12/07/2020   Procedure: ANTERIOR APPROACH HEMI HIP ARTHROPLASTY;  Surgeon: Meredith Pel, MD;  Location: [facility];  Service: Orthopedics;  Laterality: Left;   AUGMENTATION MAMMAPLASTY Right 1985   RT MASTECTOMY FOR BREAST CA   EYE SURGERY     cataract   MASTECTOMY Right 1985   reconstruction inplant as well   TOTAL HIP REVISION Left 02/03/2021   Procedure: ORIF OF LEFT POSTERIOR FEMUR FRACTURE & REVISION OF STEM;  Surgeon: Leandrew Koyanagi, MD;  Location: [place];  Service: Orthopedics;  Laterality: Left;   Social History   Occupational History   Not on file  Tobacco Use   Smoking status: Never   Smokeless tobacco: Never  Vaping Use   Vaping Use: Never used  Substance and Sexual Activity   Alcohol use: No    Alcohol/week: 0.0 standard drinks   Drug use: No   Sexual activity: Never

## 2021-04-06 ENCOUNTER — Ambulatory Visit (INDEPENDENT_AMBULATORY_CARE_PROVIDER_SITE_OTHER): Payer: Medicare PPO | Admitting: Orthopaedic Surgery

## 2021-04-06 ENCOUNTER — Encounter: Payer: Self-pay | Admitting: Orthopaedic Surgery

## 2021-04-06 ENCOUNTER — Ambulatory Visit: Payer: Medicare PPO

## 2021-04-06 ENCOUNTER — Other Ambulatory Visit: Payer: Self-pay

## 2021-04-06 DIAGNOSIS — S72002D Fracture of unspecified part of neck of left femur, subsequent encounter for closed fracture with routine healing: Secondary | ICD-10-CM

## 2021-04-06 NOTE — Progress Notes (Signed)
Post-Op Visit Note   Patient: Latasha Anderson           Date of Birth: January 25, 1931           MRN: [id number] Visit Date: 04/06/2021 PCP: Latasha Anderson., MD   Assessment & Plan:  Chief Complaint:  Chief Complaint  Patient presents with   Left Hip - Follow-up    Left periprosthetic femur fracture 02/03/21   Visit Diagnoses:  1. Closed fracture of left hip with routine healing, subsequent encounter     Plan: [name] is 6 weeks status post ORIF left periprosthetic femur fracture and revision of femoral component.  Overall doing well reports only has some pain when she does physical therapy.  Left hip scars are all fully healed.  No signs of infection.  She has no pain with passive range of motion or active range of motion to the hip or knee.  X-rays demonstrate a consolidation of the fractures and a stable femoral stem.  At this point we will allow her to weight-bear as tolerated to the left lower extremity.  She may also begin strengthening as well with physical therapy.  This was written on a prescription pad for her.  Recheck in 6 weeks with two-view x-rays of the left femur.  Follow-Up Instructions: Return in about 6 weeks (around 05/18/2021).   Orders:  Orders Placed This Encounter  Procedures   XR HIP UNILAT W OR W/O PELVIS 2-3 VIEWS LEFT   No orders of the defined types were placed in this encounter.   Imaging: XR HIP UNILAT W OR W/O PELVIS 2-3 VIEWS LEFT  Result Date: 04/06/2021 Stable left femoral stem.  Greater trochanter fracture is demonstrating consolidation.  Femoral shaft fracture is healed.  No hardware complications.   PMFS History: Patient Active Problem List   Diagnosis Date Noted   Protein-calorie malnutrition, severe 02/09/2021   Periprosthetic fracture around internal prosthetic left hip joint ([place]) 02/01/2021   Dementia ([place]) 02/01/2021   DNR (do not resuscitate) 02/01/2021   Hip fracture ([place]) 12/06/2020   Generalized weakness  11/27/2020   Low back pain 11/27/2020   Frequent falls 11/27/2020   (HFpEF) heart failure with preserved ejection fraction (HCC)    Chronic atrial fibrillation ([place]) 05/01/2015   Atrophic vaginitis 12/15/2014   Breast CA ([place]) 12/15/2014   Decrease in the ability to hear 12/15/2014   Essential (primary) hypertension 12/15/2014   Fatigue 12/15/2014   Acid reflux 12/15/2014   Glaucoma 12/15/2014   Flu vaccine need 12/15/2014   Blood glucose elevated 12/15/2014   Arthritis, degenerative 12/15/2014   OP (osteoporosis) 12/15/2014   HLD (hyperlipidemia) 12/15/2014   Pain of upper abdomen 12/15/2014   Encounter for therapeutic drug monitoring 09/04/2013   Preop cardiovascular exam 03/07/2013   Hypokalemia    Long term (current) use of anticoagulants 08/22/2012   Hypertension    Breast cancer ([place])    Shortness of breath    Chest pain    Ejection fraction    Mitral regurgitation    SHINGLES 07/26/2010   Past Medical History:  Diagnosis Date   (HFpEF) heart failure with preserved ejection fraction ([place])    a. EF 55-65%, January, 2012, question diastolic dysfunction; b. 27/2536 Echo: EF 55-60%, no rwma. Mild MR. Mildly dil LA. Nl RV fxn. PASP 49mmHg.   Breast cancer ([place]) 1985   RT MASTECTOMY   Chest pain    Dyslipidemia    Hypertension    Hypokalemia    February,  2014, potassium started   Mitral regurgitation    Mild, echo, 2012   Permanent atrial fibrillation ([place])    a. Dx 08/2012. CHA2DSD2VASc = 4-5-->warfarin.   Shingles    Shortness of breath    January, 8270, normal systolic function, question diastolic dysfunction    Family History  Problem Relation Age of Onset   Stroke Mother    Diabetes Father    Cancer Brother        prostate   Breast cancer Neg Hx     Past Surgical History:  Procedure Laterality Date   ABDOMINAL HYSTERECTOMY     age 24   ANTERIOR APPROACH HEMI HIP ARTHROPLASTY Left 12/07/2020   Procedure: ANTERIOR APPROACH HEMI HIP ARTHROPLASTY;  Surgeon:  Meredith Pel, MD;  Location: [facility];  Service: Orthopedics;  Laterality: Left;   AUGMENTATION MAMMAPLASTY Right 1985   RT MASTECTOMY FOR BREAST CA   EYE SURGERY     cataract   MASTECTOMY Right 1985   reconstruction inplant as well   TOTAL HIP REVISION Left 02/03/2021   Procedure: ORIF OF LEFT POSTERIOR FEMUR FRACTURE & REVISION OF STEM;  Surgeon: Leandrew Koyanagi, MD;  Location: [place];  Service: Orthopedics;  Laterality: Left;   Social History   Occupational History   Not on file  Tobacco Use   Smoking status: Never   Smokeless tobacco: Never  Vaping Use   Vaping Use: Never used  Substance and Sexual Activity   Alcohol use: No    Alcohol/week: 0.0 standard drinks   Drug use: No   Sexual activity: Never

## 2021-05-05 ENCOUNTER — Other Ambulatory Visit: Payer: Self-pay

## 2021-05-05 ENCOUNTER — Non-Acute Institutional Stay: Payer: Self-pay | Admitting: Student

## 2021-05-05 DIAGNOSIS — F0390 Unspecified dementia without behavioral disturbance: Secondary | ICD-10-CM

## 2021-05-05 DIAGNOSIS — E43 Unspecified severe protein-calorie malnutrition: Secondary | ICD-10-CM

## 2021-05-05 DIAGNOSIS — Z515 Encounter for palliative care: Secondary | ICD-10-CM

## 2021-05-05 NOTE — Progress Notes (Signed)
[facility] Pall[facility] Care Consult Note Telephone: [phone]  Fax: [phone]    Date of encounter: 05/05/21 11:22 AM PATIENT NAME: Latasha Anderson Latasha Anderson [id number]   [phone] (home)  DOB: 25-Aug-1930 MRN: [id number] PRIMARY CARE PROVIDER:    Dr. Lincoln Brigham  REFERRING PROVIDER:   Dr. Lincoln Brigham  RESPONSIBLE PARTY:    Contact Information     Name Relation Home Work Mobile   Hocutt,Cecil Spouse [phone]     Utley,Cindy Daughter   [phone]        I met face to face with patient and family in the facility. Palliative Care was asked to follow this patient by consultation request of  Dr. Shon Hough to address advance care planning and complex medical decision making. This is a follow up visit.  Daughter Jenny Reichmann is updated on today's visit via telephone. [place] updated on today's visit.                                   ASSESSMENT AND PLAN / RECOMMENDATIONS:   Advance Care Planning/Goals of Care: Goals include to maximize quality of life and symptom management. Our advance care planning conversation included a discussion about:    The value and importance of advance care planning  Experiences with loved ones who have been seriously ill or have died  Exploration of personal, cultural or spiritual beliefs that might influence medical decisions  Exploration of goals of care in the event of a sudden injury or illness  Identification and preparation of a healthcare agent  Review and updating or creation of an  advance directive document . Decision not to resuscitate or to de-escalate disease focused treatments due to poor prognosis. CODE STATUS: DNR  Education provided on palliative medicine versus hospice services.  Patient does not meet eligibility requirements for dementia at this time. This will be an ongoing assessment. Will recommend for hospice services when she meets  eligibility requirements. Palliative medicine will continue to provide supportive care.  Symptom Management/Plan:  Dementia-patient is dependent for adl's; she has been stable. Staff to continue assisting with all adl needs. Monitor for falls and safety. Out of bed as tolerated. Weight has been stable; good appetite.   Protein calorie malnutrition- Albumin 3.0; weight has been stable. Continue assisting with feeding patient, nutritional supplement BID. Routine weights recommended.  Follow up Palliative Care Visit: Palliative care will continue to follow for complex medical decision making, advance care planning, and clarification of goals. Return n 4-6 weeks or prn.  I spent 35 minutes providing this consultation. More than 50% of the time in this consultation was spent in counseling and care coordination.   PPS: 30%  HOSPICE ELIGIBILITY/DIAGNOSIS: TBD  Chief Complaint: Palliative Medicine follow up visit.   HISTORY OF PRESENT ILLNESS:  Latasha Anderson is a 87 y.o. year old female  with Dementia, chronic atrial fibrillation, osteoarthritis, chronic diastolic heart failure, hypertension, hyperlipidemia, glaucoma, weakness, hx of T-11-12 compression fracture. Patient hospitalized 8/1-02/09/2021 due to periprosthetic fracture around internal prosthetic left hip joint of femur, s/p ORIF left posterior femur fracture and revision of STEM on 02/03/2021. She was seen by ortho for f/u on 10/4; x-ray shows stable left femoral stem, greater trochanter fracture is demonstrating consolidation, femoral shaft fracture is healed; no hardware complications.  Patient resides at [facility] and rehab.  Mr. Gamm had called in to  see if patient can go on hospice. Nursing staff and daughter were unaware that patient's husband had expressed this concern. Nursing staff reports patient being stable. She did have a fall on 04/27/21, where she attempted to get up unassisted. She is dependent for all ADLs. Good  appetite endorsed. Husband is at facility usually 10 hours a day and he assist with feeding patient. No swallowing difficulty reported. No skin breakdown. Patient does sleep intermittently during the day.  She is out of bed as tolerated. No recent infections. Patient is able to answer direct questions; HPI and ROS primarily obtained from husband and staff.  History obtained from review of EMR, discussion with primary team, and interview with family, facility staff/caregiver and/or Ms. Lewan.  I reviewed available labs, medications, imaging, studies and related documents from the EMR.  Records reviewed and summarized above.   ROS  Unable to obtain from patient due to her dementia.   Physical Exam: Weight: 127.8 pounds Pulse 76, resp 16, b/p 120/62, sats 94% on room air Constitutional: NAD, PAINAD-0 General: frail appearing, thin  EYES: anicteric sclera, lids intact, no discharge  ENMT: intact hearing, oral mucous membranes moist, dentition intact CV: S1S2, RRR, no LE edema Pulmonary: LCTA, no increased work of breathing, no cough Abdomen: normo-active BS + 4 quadrants, soft and non tender GU: deferred MSK: non ambulatory Skin: warm and dry, no rashes or wounds on visible skin Neuro:  generalized weakness, A & O to person Psych: non-anxious affect, no agitation or anxiety observed Hem/lymph/immuno: no widespread bruising   Thank you for the opportunity to participate in the care of Ms. Wernli.  The palliative care team will continue to follow. Please call our office at [phone] if we can be of additional assistance.   Ezekiel Slocumb, NP   COVID-19 PATIENT SCREENING TOOL Asked and negative response unless otherwise noted:   Have you had symptoms of covid, tested positive or been in contact with someone with symptoms/positive test in the past 5-10 days?  No

## 2021-06-23 ENCOUNTER — Non-Acute Institutional Stay: Payer: Self-pay | Admitting: Student

## 2021-06-23 ENCOUNTER — Other Ambulatory Visit: Payer: Self-pay

## 2021-06-23 DIAGNOSIS — F03C0 Unspecified dementia, severe, without behavioral disturbance, psychotic disturbance, mood disturbance, and anxiety: Secondary | ICD-10-CM

## 2021-06-23 DIAGNOSIS — R630 Anorexia: Secondary | ICD-10-CM

## 2021-06-23 DIAGNOSIS — Z515 Encounter for palliative care: Secondary | ICD-10-CM

## 2021-06-23 NOTE — Progress Notes (Signed)
[facility] Consult Note Telephone: [phone]  Fax: [phone]    Date of encounter: 06/23/21 2:10 PM PATIENT NAME: Latasha Anderson [place] [address]   [phone] (home)  DOB: July 31, 1930 MRN: [id number] PRIMARY CARE PROVIDER:    Dr. Shon Baton PROVIDER:   Dr. Shon Hough  RESPONSIBLE PARTY:    Contact Information     Name Relation Home Work Mobile   Latasha Anderson,Latasha Anderson Spouse [phone]     Latasha Anderson,Latasha Anderson Daughter   [phone]        I met face to face with patient and family in the facility. Palliative Care was asked to follow this patient by consultation request of  Dr. Shon Hough to address advance care planning and complex medical decision making. This is a follow up visit. Husband and daughter Latasha Anderson present during visit.                                    ASSESSMENT AND PLAN / RECOMMENDATIONS:   Advance Care Planning/Goals of Care: Goals include to maximize quality of life and symptom management. Our advance care planning conversation included a discussion about:    The value and importance of advance care planning  Experiences with loved ones who have been seriously ill or have died  Exploration of personal, cultural or spiritual beliefs that might influence medical decisions  Exploration of goals of care in the event of a sudden injury or illness  CODE STATUS: DNR   Palliative Medicine will continue to provide ongoing support. Will monitor for changes and declines. Will refer to hospice when she meets criteria.   Symptom Management/Plan:  Dementia-patient is dependent for adl's; she has been stable. Staff to continue assisting with all adl needs. Monitor for falls and safety. Out of bed as tolerated.  Appetite-fair appetite. Continue nutritional supplement BID; staff to continue assisting with feeding. Encourage adequate fluid intake.   Follow up Palliative Care Visit:  Palliative care will continue to follow for complex medical decision making, advance care planning, and clarification of goals. Return 6-8 weeks or prn.  I spent 30 minutes providing this consultation. More than 50% of the time in this consultation was spent in counseling and care coordination.    PPS: 30%  HOSPICE ELIGIBILITY/DIAGNOSIS: TBD  Chief Complaint: Palliative Medicine follow up visit.   HISTORY OF PRESENT ILLNESS:  Kyan Giannone is a 87 y.o. year old female  with Dementia, chronic atrial fibrillation, osteoarthritis, chronic diastolic heart failure, hypertension, hyperlipidemia, glaucoma, weakness, hx of T-11-12 compression fracture. Patient hospitalized 8/1-02/09/2021 due to periprosthetic fracture around internal prosthetic left hip joint of femur, s/p ORIF left posterior femur fracture and revision of STEM on 02/03/2021. She was seen by ortho for f/u on 10/4; x-ray shows stable left femoral stem, greater trochanter fracture is demonstrating consolidation, femoral shaft fracture is healed; no hardware complications.   Patient resides at [facility] and rehab. She has been stable per staff. She is currently being treated for UTI. Staff also reports patient pulled out her foley catheter; new catheter reinserted without difficulty. Patient is dependent for all adl's. She is out of bed daily to w/c. Patient's appetite varies; she requires assist with feeding. Today she was able to feed herself. Daughter reports patient doing better in her room with meals due to less distractions. Husband continues to visit daily. Patient did have a fall in  the past 3 weeks; no apparent injury reported. No recent ED visits or hospitalizations. Due to patient's advanced dementia, HPI and ROS obtained per family and facility staff. A 10-point review of systems is negative, except for the pertinent positives and negatives detailed in the HPI.    History obtained from review of EMR, discussion with  primary team, and interview with family, facility staff/caregiver and/or Ms. Kuan.  I reviewed available labs, medications, imaging, studies and related documents from the EMR.  Records reviewed and summarized above.   ROS  Unable to obtain due to patient's advanced dementia.  Physical Exam: Pulse 78, resp 16, b/p 130/70, sats 94% on room air Constitutional: NAD General: frail appearing EYES: anicteric sclera, lids intact, no discharge  ENMT: intact hearing, oral mucous membranes moist, dentition intact CV: S1S2, RRR, mild pedal edema Pulmonary: LCTA, no increased work of breathing, no cough, room air Abdomen: normo-active BS + 4 quadrants, soft and non tender, no ascites GU: foley catheter intact; amber urine in drainage bag MSK: non-ambulatory Skin: warm and dry, no rashes or wounds on visible skin Neuro: generalized weakness, alert, disoriented Psych: non-anxious affect Hem/lymph/immuno: no widespread bruising   Thank you for the opportunity to participate in the care of Ms. Sans.  The palliative care team will continue to follow. Please call our office at [phone] if we can be of additional assistance.   Ezekiel Slocumb, NP   COVID-19 PATIENT SCREENING TOOL Asked and negative response unless otherwise noted:   Have you had symptoms of covid, tested positive or been in contact with someone with symptoms/positive test in the past 5-10 days? No

## 2021-07-28 ENCOUNTER — Non-Acute Institutional Stay: Payer: Self-pay | Admitting: Student

## 2021-07-28 ENCOUNTER — Other Ambulatory Visit: Payer: Self-pay

## 2021-07-28 DIAGNOSIS — Z515 Encounter for palliative care: Secondary | ICD-10-CM

## 2021-07-28 DIAGNOSIS — R630 Anorexia: Secondary | ICD-10-CM

## 2021-07-28 DIAGNOSIS — F03C0 Unspecified dementia, severe, without behavioral disturbance, psychotic disturbance, mood disturbance, and anxiety: Secondary | ICD-10-CM

## 2021-07-28 NOTE — Progress Notes (Signed)
[facility] Palliative Care Consult Note Telephone: [phone]  Fax: [phone]    Date of encounter: 07/28/21 11:45 AM PATIENT NAME: Latasha Anderson [address] [place] [address]   [phone] (home)  DOB: Jan 11, 1931 MRN: [id number] PRIMARY CARE PROVIDER:    Dr. Shon Baton PROVIDER:   Dr. Shon Hough  RESPONSIBLE PARTY:    Contact Information     Name Relation Home Work Mobile   Anderson,Latasha Spouse [phone]     Anderson,Latasha Daughter   [phone]        I met face to face with patient and family in the facility. Palliative Care was asked to follow this patient by consultation request of  Dr. Shon Hough to address advance care planning and complex medical decision making. This is a follow up visit.                                   ASSESSMENT AND PLAN / RECOMMENDATIONS:   Advance Care Planning/Goals of Care: Goals include to maximize quality of life and symptom management. Patient/health care surrogate gave his/her permission to discuss. CODE STATUS: DNR  Palliative Medicine will continue to provide ongoing support.  Ongoing education on dementia care.  Will monitor for changes and declines. Will refer to hospice when she meets criteria.  Symptom Management/Plan:  Dementia-patient is dependent for adl's. Staff to continue assisting with all adl needs. Speech is incoherent, mumbled.  Requires assistance with feeding. Monitor for falls and safety. Out of bed as tolerated.  Continued education for family and staff on dementia.   Appetite-fair appetite. Continue nutritional supplement BID; staff to continue assisting with feeding. Encourage adequate fluid intake. staff asked to obtain current weight; will monitor for weight loss.   Follow up Palliative Care Visit: Palliative care will continue to follow for complex medical decision making, advance care planning, and clarification of goals. Return 6-8 weeks or  prn.   This visit was coded based on medical decision making (MDM).  PPS: 30%  HOSPICE ELIGIBILITY/DIAGNOSIS: TBD  Chief Complaint: Palliative medicine follow-up visit.  HISTORY OF PRESENT ILLNESS:  Latasha Anderson is a 88 y.o. year old female  with Dementia, chronic atrial fibrillation, osteoarthritis, chronic diastolic heart failure, hypertension, hyperlipidemia, glaucoma, weakness, hx of T-11-12 compression fracture. Patient hospitalized 8/1-02/09/2021 due to periprosthetic fracture around internal prosthetic left hip joint of femur, s/p ORIF left posterior femur fracture and revision of STEM on 02/03/2021. She was seen by ortho for f/u on 10/4; x-ray shows stable left femoral stem, greater trochanter fracture is demonstrating consolidation, femoral shaft fracture is healed; no hardware complications.  Patient resides at [facility] and rehab.  Husband is at bedside.  He reports patient having agitation at times, disrobing inappropriately.  Patient is having to be fed, she is able to feed herself at times. Current weight unavailable. Patient is status post fall yesterday.  Nursing staff reports patient had attempted to stand unassisted and husband attempted to assist patient and she fell. No apparent injury reported.  No recent infections reported.  No ED visits or hospitalizations.  Patient is somnolent during visit today; she does briefly arouse to verbal and tactile stimulation.  Her speech is mostly mumbled and incoherent. HPI and ROS obtained per family and facility staff. A 10-point review of systems is negative, except for the pertinent positives and negatives detailed in the HPI.   History obtained from  review of EMR, discussion with primary team, and interview with family, facility staff/caregiver and/or Latasha Anderson.  I reviewed available labs, medications, imaging, studies and related documents from the EMR.  Records reviewed and summarized above.   ROS  Unable to obtain due to  patient's advanced dementia.  Physical Exam: Pulse 66, resp 16, b/p 120/70, sats 96% on room air Constitutional: NAD General: frail appearing, thin EYES: anicteric sclera, lids intact, no discharge  ENMT: intact hearing, oral mucous membranes moist, dentition intact CV: S1S2, RRR, no LE edema Pulmonary: LCTA, no increased work of breathing, no cough, room air Abdomen: normo-active BS + 4 quadrants, soft and non tender, no ascites GU: deferred MSK: moves all extremities, non- ambulatory Skin: warm and dry, no rashes or wounds on visible skin Neuro:  generalized weakness, Alert, disoriented Psych: non-anxious affect Hem/lymph/immuno: hemorrhagic bruising to bilateral arms   Thank you for the opportunity to participate in the care of Latasha Anderson.  The palliative care team will continue to follow. Please call our office at [phone] if we can be of additional assistance.   Ezekiel Slocumb, NP   COVID-19 PATIENT SCREENING TOOL Asked and negative response unless otherwise noted:   Have you had symptoms of covid, tested positive or been in contact with someone with symptoms/positive test in the past 5-10 days? No

## 2021-09-03 ENCOUNTER — Other Ambulatory Visit: Payer: Self-pay

## 2021-09-03 ENCOUNTER — Non-Acute Institutional Stay: Payer: Self-pay | Admitting: Student

## 2021-09-03 DIAGNOSIS — F03C11 Unspecified dementia, severe, with agitation: Secondary | ICD-10-CM

## 2021-09-03 DIAGNOSIS — Z515 Encounter for palliative care: Secondary | ICD-10-CM

## 2021-09-03 DIAGNOSIS — E43 Unspecified severe protein-calorie malnutrition: Secondary | ICD-10-CM

## 2021-09-03 NOTE — Progress Notes (Signed)
? ? ?Manufacturing engineer ?Community Palliative Care Consult Note ?Telephone: [phone]  ?Fax: [phone]  ? ? ?Date of encounter: 09/03/21 ?4:08 PM ?PAT[place] NAME: [place] [address]   ?[phone] (home)  ?DOB: Jun 24, 1931 ?MRN: [id number] ?PRIMARY CARE PROVIDER:    ?Dr. Lincoln Brigham ? ?REFERRING PROVIDER:   ?Dr. Lincoln Brigham ? ?RESPONSIBLE PARTY:    ?Contact Information   ? ? Name Relation Home Work Mobile  ? Fauteux,Cecil Spouse [phone]    ? Depolo,Cindy Daughter   [phone]  ? ?  ? ? ? ?I met face to face with patient and family in the facility. Palliative Care was asked to follow this patient by consultation request of  Dr. Lincoln Brigham to address advance care planning and complex medical decision making. This is a follow up visit. ? ?                                 ASSESSMENT AND PLAN / RECOMMENDATIONS:  ? ?Advance Care Planning/Goals of Care: Goals include to maximize quality of life and symptom management. Patient/health care surrogate gave his/her permission to discuss. ?Our advance care planning conversation included a discussion about:    ?The value and importance of advance care planning  ?Experiences with loved ones who have been seriously ill or have died  ?Exploration of personal, cultural or spiritual beliefs that might influence medical decisions  ?Exploration of goals of care in the event of a sudden injury or illness  ?CODE STATUS: DNR ? ?Education provided on palliative medicine versus hospice services.  Given patient's continued weight loss, will present to hospice medical director and discuss hospice eligibility.  Discussed with family, daughter Jenny Reichmann via telephone.  Will provide update to family once decision is made regarding hospice eligibility. ? ?Symptom Management/Plan: ? ?Dementia-patient with advanced dementia. She is dependent for all adl's. She requires assistance with feeding; has had continued weight  loss. She has frequent falls. Monitor for falls/safety; continue PT as directed. Patient's namenda was increased to BID last month.  ? ?Protein calorie malnutrition, weight loss-patient's appetite varies. She is sometimes fed by staff. She receives nutritional supplement TID; continues to lose weight. Facility staff to weigh per policy. Weight down to 108.6 pounds. Will discuss hospice eligibility d/t her weight loss, protein calorie malnutrition.  ? ?Follow up Palliative Care Visit: Palliative care will continue to follow for complex medical decision making, advance care planning, and clarification of goals. Return in 2 weeks or prn. ? ?This visit was coded based on medical decision making (MDM). ? ?PPS: 30% ? ?HOSPICE ELIGIBILITY/DIAGNOSIS: TBD ? ?Chief Complaint: Palliative Medicine follow up visit.  ? ?HISTORY OF PRESENT ILLNESS:  Latasha Anderson is a 88 y.o. year old female  with  Dementia, chronic atrial fibrillation, osteoarthritis, chronic diastolic heart failure, hypertension, hyperlipidemia, glaucoma, weakness, hx of T-11-12 compression fracture. Patient hospitalized 8/1-02/09/2021 due to periprosthetic fracture around internal prosthetic left hip joint of femur, s/p ORIF left posterior femur fracture and revision of STEM on 02/03/2021. She was seen by ortho for f/u on 10/4; x-ray shows stable left femoral stem, greater trochanter fracture is demonstrating consolidation, femoral shaft fracture is healed; no hardware complications.  ? ?Patient resides at [facility] and rehab. Husband is present; they are sitting in dining room. Patient's speech is more clear today, although her conversation is nonsensical. 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 Usually her speech is mumbled. Husband states she does not recognize him or other family most days. She is dependent for all adl's; care needs anticipated per staff. She has indwelling foley catheter; staff states she has not pulled it  out in past two weeks. She completed antibiotics for UTI on 09/01/21. Patient with agitation at times. Frequent falls per staff. She is currently getting PT per husband. Patient requires assist with feeding; she does attempt to feed herself, although appetite varies. Staff and daughter state sometimes she just will not eat. Patient has had weight loss. 08/2021 weight 108. 6 pounds. HPI and ROS obtained per family and facility staff. A 10-point review of systems is negative, except for the pertinent positives and negatives detailed in the HPI.  ? ?History obtained from review of EMR, discussion with primary team, and interview with family, facility staff/caregiver and/or Ms. Duhart.  ?I reviewed available labs, medications, imaging, studies and related documents from the EMR.  Records reviewed and summarized above.  ? ?ROS ? ?Unable to obtain due to patient's advanced dementia. ? ?Physical Exam: ?Pulse 96, resp 16, b/p 130/78, sats 93-94% on room air ?Constitutional: NAD ?General: frail appearing, thin ?EYES: anicteric sclera, lids intact, no discharge  ?ENMT: intact hearing, oral mucous membranes moist, dentition intact ?CV: S1S2, RRR, no LE edema ?Pulmonary: LCTA, no increased work of breathing, no cough, room air ?Abdomen: normo-active BS + 4 quadrants, soft and non tender, no ascites ?GU: deferred ?MSK: +sarcopenia, moves all extremities, non-ambulatory ?Skin: warm and dry, no rashes or wounds on visible skin ?Neuro: generalized weakness, alert and disoriented ?Psych: non-anxious affect, cooperative ?Hem/lymph/immuno: bruising to bilateral arms ? ? ?Thank you for the opportunity to participate in the care of Latasha Anderson.  The palliative care team will continue to follow. Please call our office at [phone] if we can be of additional assistance.  ? ?Ezekiel Slocumb, NP  ? ?COVID-19 PATIENT SCREENING TOOL ?Asked and negative response unless otherwise noted:  ? ?Have you had symptoms of covid, tested positive or been  in contact with someone with symptoms/positive test in the past 5-10 days? No ? ?

## 2021-09-06 ENCOUNTER — Telehealth: Payer: Self-pay | Admitting: Student

## 2021-09-06 NOTE — Telephone Encounter (Signed)
Palliative NP spoke with patient's daughter regarding approval to proceed with hospice evaluation. Message left for facility NP requesting hospice order. Family is encouraged to call as questions arise.  ?

## 2022-02-28 IMAGING — CT CT CERVICAL SPINE W/O CM
3 of 4 series · 9 of 33 positions shown, 11 images · non-contrast
Comparison: 01/09/2021

CLINICAL DATA: Witnessed fall while trying to stand from wheelchair

EXAM:
CT CERVICAL SPINE WITHOUT CONTRAST
TECHNIQUE: Multidetector CT imaging of the cervical spine was performed without
intravenous contrast. Multiplanar CT image reconstructions were also
generated.

[Series 6: orthogonal bone · axial · 0.21mm/px · z∈[-345,-345]mm · 1 of 107 slices shown, 2 images]
[im 61/107  soft-tissue]
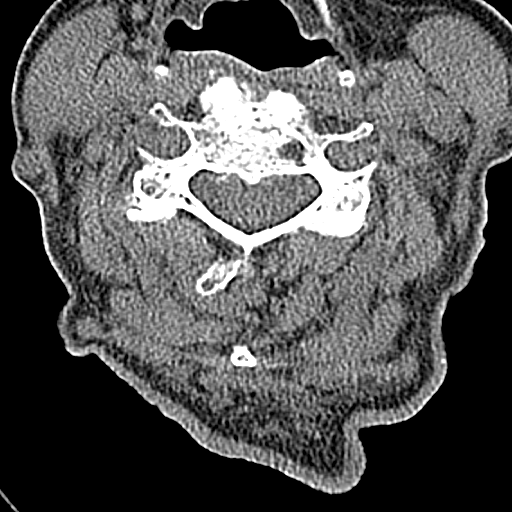
[im 61/107  bone]
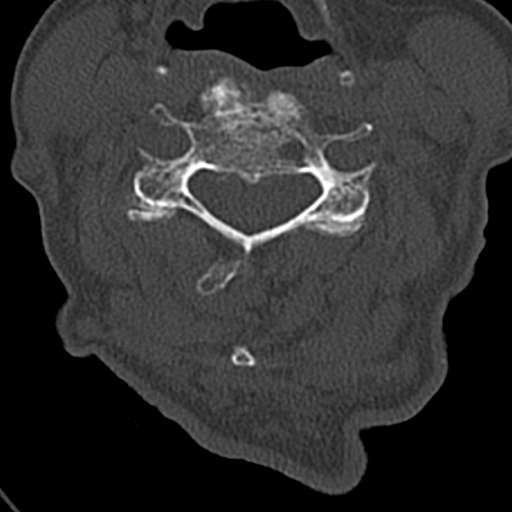

[Series 7: coronal bone · coronal · 0.21mm/px · 3 of 56 slices shown]
[im 12/56  bone]
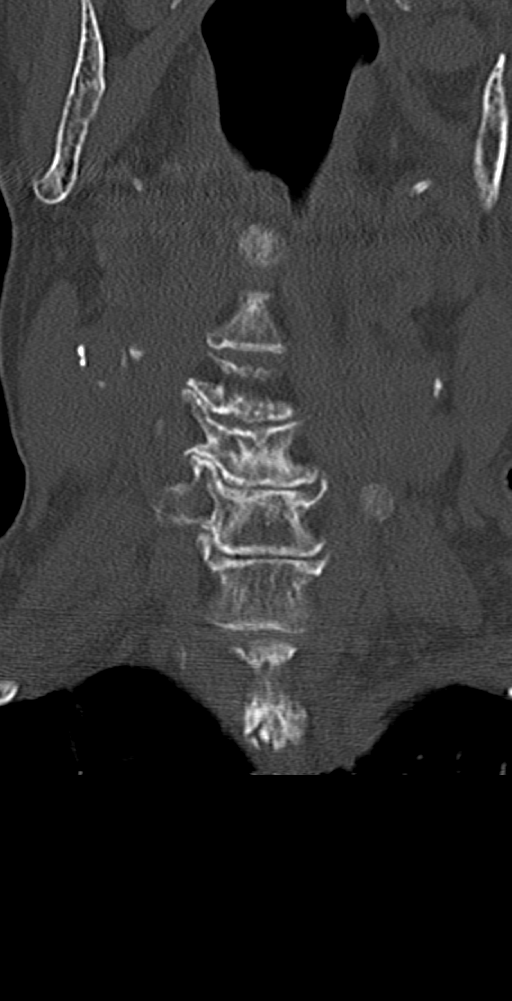
[im 23/56  bone]
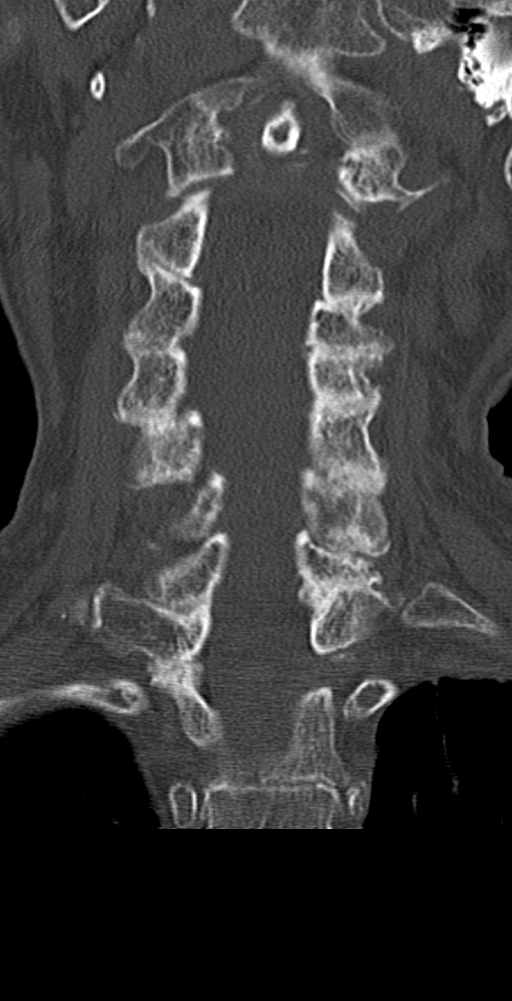
[im 33/56  bone]
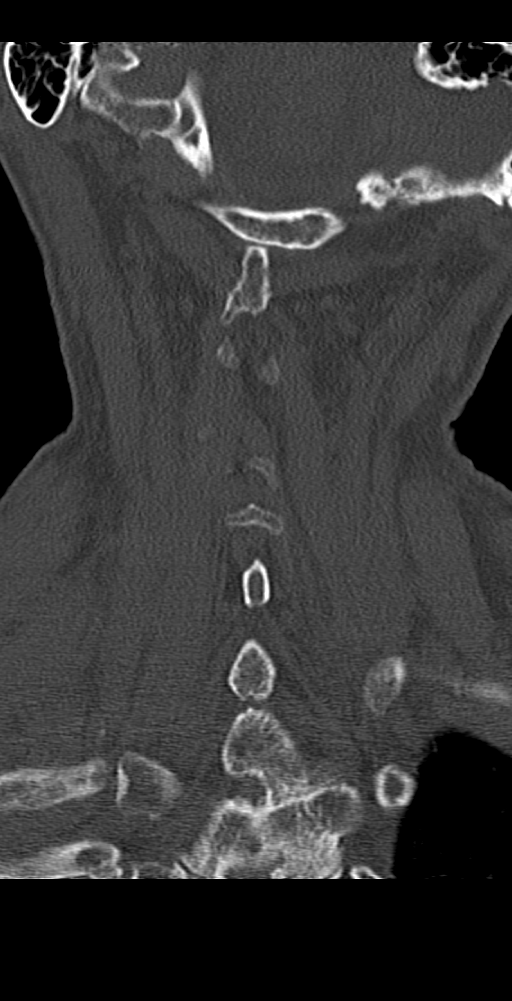

[Series 8: sagittal bone · sagittal · 0.21mm/px · 5 of 56 slices shown, 6 images]
[im 19/56  bone]
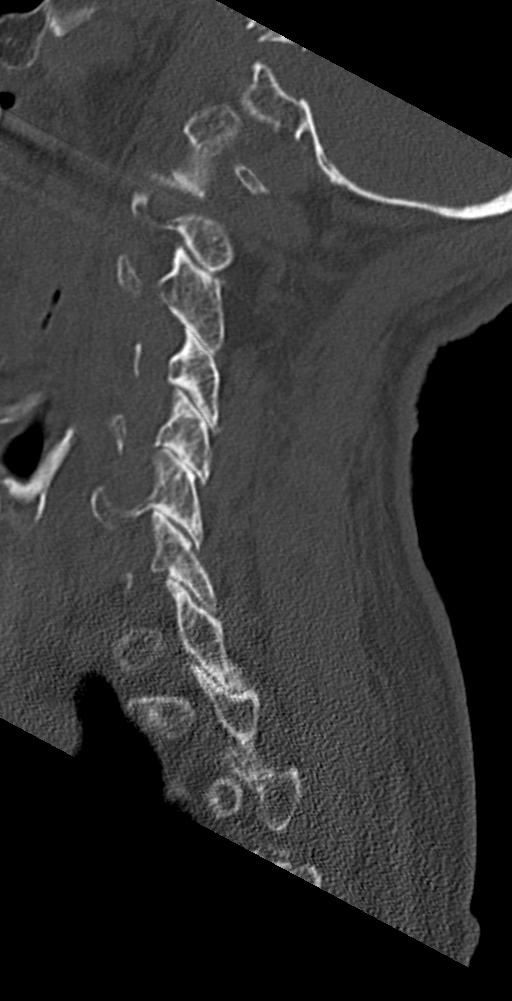
[im 23/56  bone]
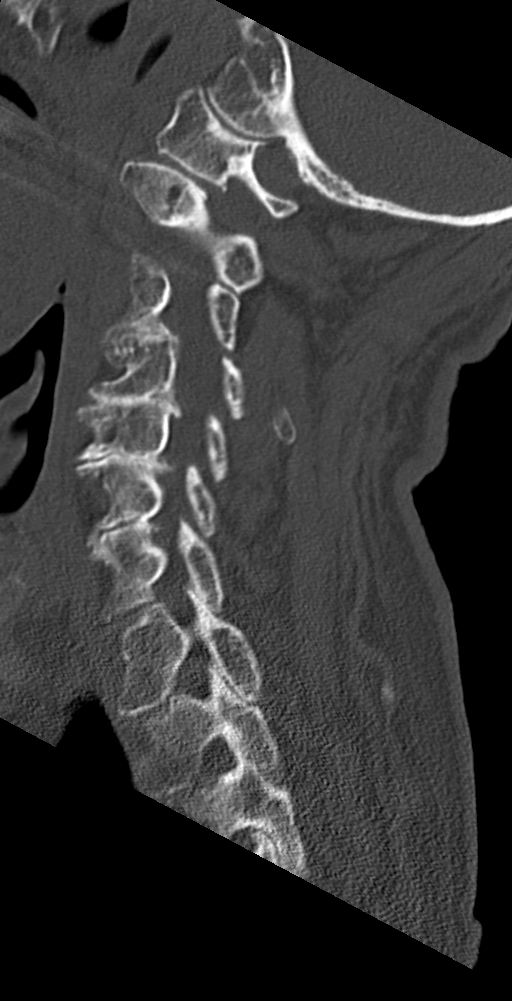
[im 28/56  soft-tissue]
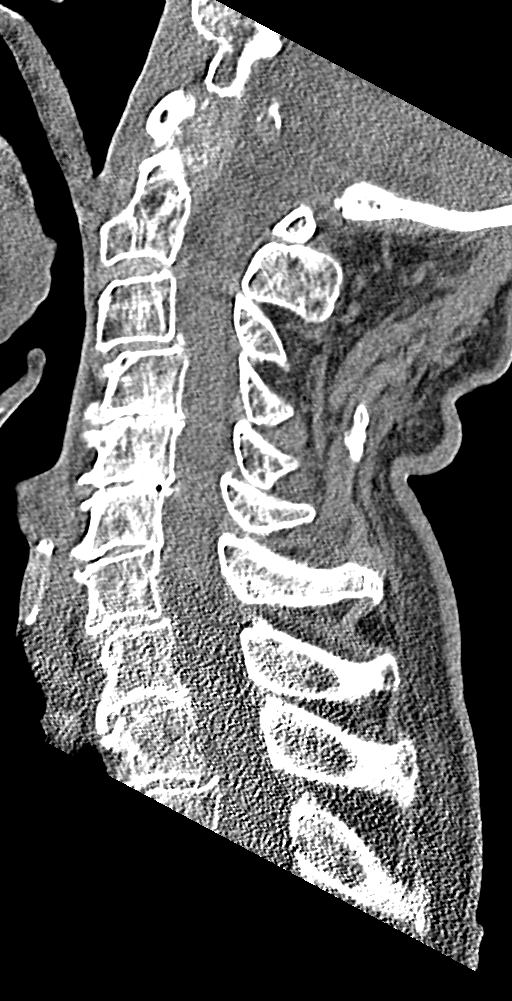
[im 28/56  bone]
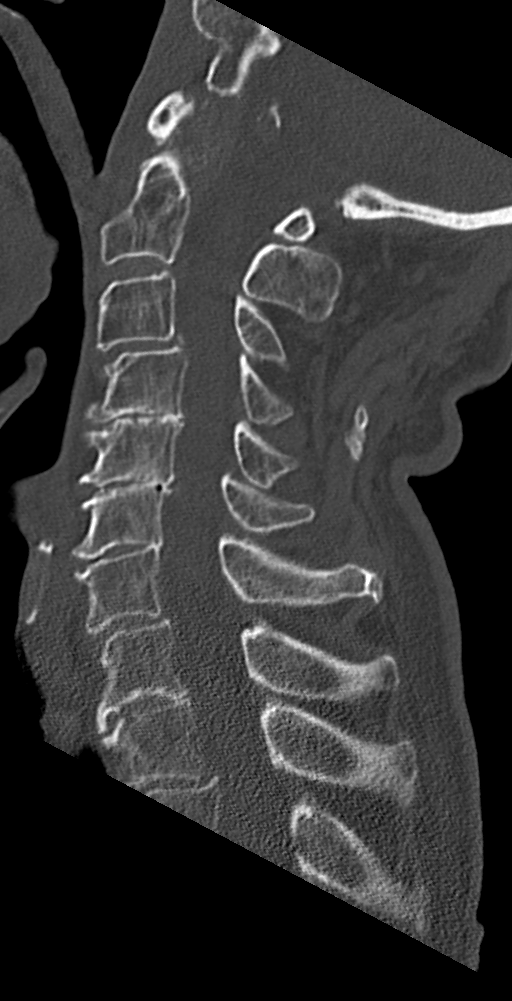
[im 33/56  bone]
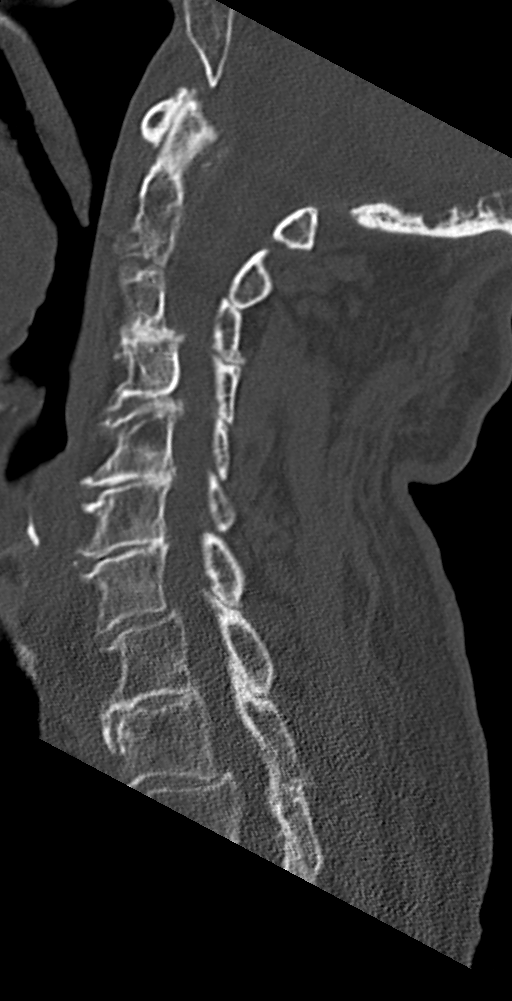
[im 37/56  bone]
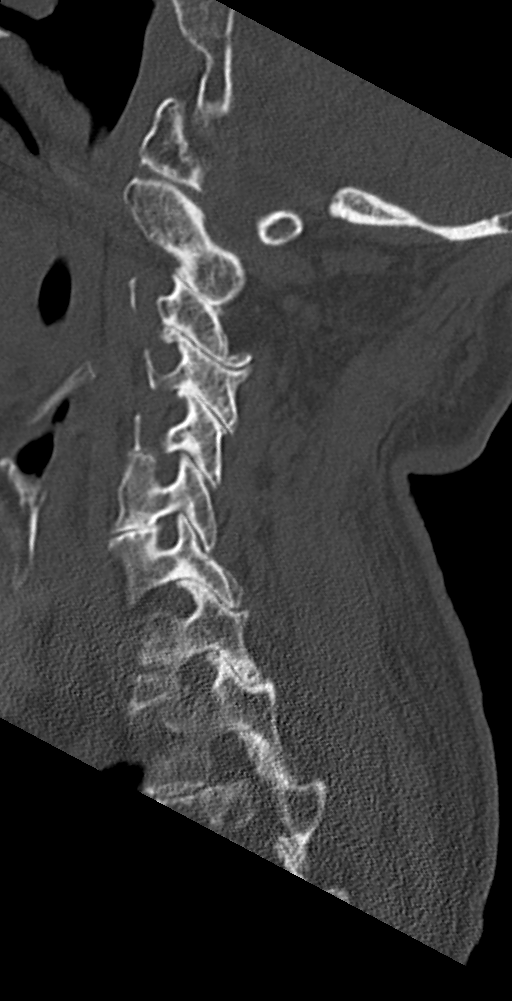

[9 of 33 positions shown; findings below may reference images not displayed]

FINDINGS: Alignment: Slight anterolisthesis at C3-C4 and T2-T3 unchanged.
Remaining alignments normal.

Skull base and vertebrae: Osseous demineralization. Vertebral body
heights maintained. Multilevel disc space narrowing and endplate
spur formation throughout cervical spine. Multilevel facet
degenerative changes. Skull base intact. No fracture, additional
subluxation, or bone destruction.

Soft tissues and spinal canal: Prevertebral soft tissues normal
thickness. Atherosclerotic calcifications at carotid bifurcations
bilaterally as well as at RIGHT vertebral artery.

Disc levels:  No specific abnormalities

Upper chest: Small BILATERAL pleural effusions. Lung apices
otherwise clear.

Other: N/A
IMPRESSION: Multilevel degenerative disc and facet disease changes of the
cervical spine.

No acute cervical spine abnormalities.

Small BILATERAL pleural effusions.

## 2022-08-04 DEATH — deceased
